# Patient Record
Sex: Female | Born: 1950 | Race: White | Hispanic: No | Marital: Married | Smoking: Former smoker
Health system: Southern US, Community
[De-identification: ages and names within clinical notes are randomized; demographics above are authoritative.]

## PROBLEM LIST (undated history)

## (undated) DIAGNOSIS — E119 Type 2 diabetes mellitus without complications: Secondary | ICD-10-CM

## (undated) DIAGNOSIS — F419 Anxiety disorder, unspecified: Secondary | ICD-10-CM

## (undated) DIAGNOSIS — IMO0002 Reserved for concepts with insufficient information to code with codable children: Secondary | ICD-10-CM

## (undated) DIAGNOSIS — M797 Fibromyalgia: Secondary | ICD-10-CM

## (undated) DIAGNOSIS — F329 Major depressive disorder, single episode, unspecified: Secondary | ICD-10-CM

## (undated) DIAGNOSIS — J449 Chronic obstructive pulmonary disease, unspecified: Secondary | ICD-10-CM

## (undated) DIAGNOSIS — K219 Gastro-esophageal reflux disease without esophagitis: Secondary | ICD-10-CM

## (undated) DIAGNOSIS — I10 Essential (primary) hypertension: Secondary | ICD-10-CM

## (undated) DIAGNOSIS — E559 Vitamin D deficiency, unspecified: Secondary | ICD-10-CM

## (undated) DIAGNOSIS — F32A Depression, unspecified: Secondary | ICD-10-CM

## (undated) HISTORY — PX: ABDOMINAL HYSTERECTOMY: SHX81

## (undated) HISTORY — PX: TONSILLECTOMY: SUR1361

## (undated) HISTORY — DX: Depression, unspecified: F32.A

## (undated) HISTORY — DX: Fibromyalgia: M79.7

## (undated) HISTORY — PX: FOOT SURGERY: SHX648

## (undated) HISTORY — DX: Gastro-esophageal reflux disease without esophagitis: K21.9

## (undated) HISTORY — DX: Anxiety disorder, unspecified: F41.9

## (undated) HISTORY — DX: Vitamin D deficiency, unspecified: E55.9

## (undated) HISTORY — DX: Type 2 diabetes mellitus without complications: E11.9

## (undated) HISTORY — DX: Essential (primary) hypertension: I10

## (undated) HISTORY — PX: CHOLECYSTECTOMY: SHX55

## (undated) HISTORY — PX: SPINE SURGERY: SHX786

## (undated) HISTORY — DX: Chronic obstructive pulmonary disease, unspecified: J44.9

## (undated) HISTORY — DX: Major depressive disorder, single episode, unspecified: F32.9

## (undated) HISTORY — DX: Reserved for concepts with insufficient information to code with codable children: IMO0002

## (undated) HISTORY — PX: HAND SURGERY: SHX662

## (undated) HISTORY — PX: BREAST BIOPSY: SHX20

---

## 2002-06-24 ENCOUNTER — Encounter: Payer: Self-pay | Admitting: General Surgery

## 2002-06-24 ENCOUNTER — Ambulatory Visit (HOSPITAL_COMMUNITY): Admission: RE | Admit: 2002-06-24 | Discharge: 2002-06-24 | Payer: Self-pay | Admitting: General Surgery

## 2003-10-07 ENCOUNTER — Inpatient Hospital Stay (HOSPITAL_COMMUNITY): Admission: EM | Admit: 2003-10-07 | Discharge: 2003-10-10 | Payer: Self-pay | Admitting: Emergency Medicine

## 2003-10-14 ENCOUNTER — Ambulatory Visit (HOSPITAL_COMMUNITY): Admission: RE | Admit: 2003-10-14 | Discharge: 2003-10-14 | Payer: Self-pay | Admitting: Internal Medicine

## 2004-05-11 ENCOUNTER — Emergency Department (HOSPITAL_COMMUNITY): Admission: EM | Admit: 2004-05-11 | Discharge: 2004-05-11 | Payer: Self-pay | Admitting: *Deleted

## 2004-07-22 ENCOUNTER — Emergency Department (HOSPITAL_COMMUNITY): Admission: EM | Admit: 2004-07-22 | Discharge: 2004-07-22 | Payer: Self-pay | Admitting: Emergency Medicine

## 2005-04-10 ENCOUNTER — Ambulatory Visit: Payer: Self-pay | Admitting: Family Medicine

## 2005-04-11 ENCOUNTER — Ambulatory Visit (HOSPITAL_COMMUNITY): Admission: RE | Admit: 2005-04-11 | Discharge: 2005-04-11 | Payer: Self-pay | Admitting: Family Medicine

## 2005-04-12 ENCOUNTER — Ambulatory Visit (HOSPITAL_COMMUNITY): Admission: RE | Admit: 2005-04-12 | Discharge: 2005-04-12 | Payer: Self-pay | Admitting: Family Medicine

## 2005-05-10 ENCOUNTER — Ambulatory Visit: Payer: Self-pay | Admitting: Family Medicine

## 2005-05-18 ENCOUNTER — Ambulatory Visit (HOSPITAL_COMMUNITY): Admission: RE | Admit: 2005-05-18 | Discharge: 2005-05-18 | Payer: Self-pay | Admitting: Family Medicine

## 2005-07-11 ENCOUNTER — Ambulatory Visit: Payer: Self-pay | Admitting: Family Medicine

## 2005-07-27 ENCOUNTER — Ambulatory Visit (HOSPITAL_COMMUNITY): Admission: RE | Admit: 2005-07-27 | Discharge: 2005-07-27 | Payer: Self-pay | Admitting: Neurology

## 2006-06-27 ENCOUNTER — Ambulatory Visit (HOSPITAL_COMMUNITY): Admission: RE | Admit: 2006-06-27 | Discharge: 2006-06-27 | Payer: Self-pay | Admitting: Family Medicine

## 2006-07-31 ENCOUNTER — Ambulatory Visit (HOSPITAL_COMMUNITY): Admission: RE | Admit: 2006-07-31 | Discharge: 2006-07-31 | Payer: Self-pay | Admitting: Family Medicine

## 2006-08-07 ENCOUNTER — Encounter: Admission: RE | Admit: 2006-08-07 | Discharge: 2006-08-07 | Payer: Self-pay | Admitting: Family Medicine

## 2006-08-07 ENCOUNTER — Encounter (INDEPENDENT_AMBULATORY_CARE_PROVIDER_SITE_OTHER): Payer: Self-pay | Admitting: Specialist

## 2007-02-05 ENCOUNTER — Ambulatory Visit (HOSPITAL_COMMUNITY): Admission: RE | Admit: 2007-02-05 | Discharge: 2007-02-05 | Payer: Self-pay | Admitting: Family Medicine

## 2007-05-29 ENCOUNTER — Encounter: Payer: Self-pay | Admitting: Family Medicine

## 2007-08-06 ENCOUNTER — Ambulatory Visit (HOSPITAL_COMMUNITY): Admission: RE | Admit: 2007-08-06 | Discharge: 2007-08-06 | Payer: Self-pay | Admitting: Family Medicine

## 2007-10-08 ENCOUNTER — Ambulatory Visit (HOSPITAL_COMMUNITY): Admission: RE | Admit: 2007-10-08 | Discharge: 2007-10-08 | Payer: Self-pay | Admitting: Family Medicine

## 2008-08-06 ENCOUNTER — Ambulatory Visit (HOSPITAL_COMMUNITY): Admission: RE | Admit: 2008-08-06 | Discharge: 2008-08-06 | Payer: Self-pay | Admitting: Family Medicine

## 2009-12-16 ENCOUNTER — Emergency Department (HOSPITAL_COMMUNITY): Admission: EM | Admit: 2009-12-16 | Discharge: 2009-12-16 | Payer: Self-pay | Admitting: Emergency Medicine

## 2010-01-04 ENCOUNTER — Ambulatory Visit (HOSPITAL_COMMUNITY): Admission: RE | Admit: 2010-01-04 | Discharge: 2010-01-04 | Payer: Self-pay | Admitting: Family Medicine

## 2010-02-16 ENCOUNTER — Encounter
Admission: RE | Admit: 2010-02-16 | Discharge: 2010-05-17 | Payer: Self-pay | Source: Home / Self Care | Attending: Physical Medicine and Rehabilitation | Admitting: Physical Medicine and Rehabilitation

## 2010-02-22 ENCOUNTER — Ambulatory Visit: Payer: Self-pay | Admitting: Physical Medicine and Rehabilitation

## 2010-03-15 ENCOUNTER — Ambulatory Visit: Payer: Self-pay | Admitting: Physical Medicine and Rehabilitation

## 2010-06-18 ENCOUNTER — Encounter: Payer: Self-pay | Admitting: Family Medicine

## 2010-06-27 NOTE — Letter (Signed)
Summary: rpc chart  rpc chart   Imported By: Curtis Sites 03/09/2010 15:49:47  _____________________________________________________________________  External Attachment:    Type:   Image     Comment:   External Document

## 2010-08-12 ENCOUNTER — Emergency Department (HOSPITAL_COMMUNITY)
Admission: EM | Admit: 2010-08-12 | Discharge: 2010-08-12 | Disposition: A | Discharge status: Home / Self Care | Payer: Medicaid Other | Attending: Emergency Medicine | Admitting: Emergency Medicine

## 2010-08-12 DIAGNOSIS — M25519 Pain in unspecified shoulder: Secondary | ICD-10-CM | POA: Insufficient documentation

## 2010-08-12 DIAGNOSIS — Z79899 Other long term (current) drug therapy: Secondary | ICD-10-CM | POA: Insufficient documentation

## 2010-08-12 DIAGNOSIS — K219 Gastro-esophageal reflux disease without esophagitis: Secondary | ICD-10-CM | POA: Insufficient documentation

## 2010-08-12 DIAGNOSIS — I10 Essential (primary) hypertension: Secondary | ICD-10-CM | POA: Insufficient documentation

## 2010-08-12 DIAGNOSIS — E789 Disorder of lipoprotein metabolism, unspecified: Secondary | ICD-10-CM | POA: Insufficient documentation

## 2010-08-12 DIAGNOSIS — IMO0002 Reserved for concepts with insufficient information to code with codable children: Secondary | ICD-10-CM | POA: Insufficient documentation

## 2010-08-12 DIAGNOSIS — M549 Dorsalgia, unspecified: Secondary | ICD-10-CM | POA: Insufficient documentation

## 2010-08-12 DIAGNOSIS — F411 Generalized anxiety disorder: Secondary | ICD-10-CM | POA: Insufficient documentation

## 2010-08-12 DIAGNOSIS — E119 Type 2 diabetes mellitus without complications: Secondary | ICD-10-CM | POA: Insufficient documentation

## 2010-09-15 ENCOUNTER — Encounter
Payer: Medicaid Other | Attending: Physical Medicine and Rehabilitation | Admitting: Physical Medicine and Rehabilitation

## 2010-09-15 DIAGNOSIS — E669 Obesity, unspecified: Secondary | ICD-10-CM | POA: Insufficient documentation

## 2010-09-15 DIAGNOSIS — R209 Unspecified disturbances of skin sensation: Secondary | ICD-10-CM | POA: Insufficient documentation

## 2010-09-15 DIAGNOSIS — G8929 Other chronic pain: Secondary | ICD-10-CM | POA: Insufficient documentation

## 2010-09-15 DIAGNOSIS — M542 Cervicalgia: Secondary | ICD-10-CM

## 2010-09-15 DIAGNOSIS — M47817 Spondylosis without myelopathy or radiculopathy, lumbosacral region: Secondary | ICD-10-CM | POA: Insufficient documentation

## 2010-09-15 DIAGNOSIS — I10 Essential (primary) hypertension: Secondary | ICD-10-CM | POA: Insufficient documentation

## 2010-09-15 DIAGNOSIS — M545 Low back pain, unspecified: Secondary | ICD-10-CM | POA: Insufficient documentation

## 2010-09-15 DIAGNOSIS — R5381 Other malaise: Secondary | ICD-10-CM | POA: Insufficient documentation

## 2010-09-15 DIAGNOSIS — E119 Type 2 diabetes mellitus without complications: Secondary | ICD-10-CM | POA: Insufficient documentation

## 2010-09-15 DIAGNOSIS — G894 Chronic pain syndrome: Secondary | ICD-10-CM

## 2010-09-15 DIAGNOSIS — IMO0001 Reserved for inherently not codable concepts without codable children: Secondary | ICD-10-CM | POA: Insufficient documentation

## 2010-09-15 DIAGNOSIS — E785 Hyperlipidemia, unspecified: Secondary | ICD-10-CM | POA: Insufficient documentation

## 2010-09-15 DIAGNOSIS — M129 Arthropathy, unspecified: Secondary | ICD-10-CM | POA: Insufficient documentation

## 2010-09-16 NOTE — Assessment & Plan Note (Signed)
[name] is a pleasant 60 year old woman who was last seen by me in October 2011.  She had been doing relatively well and had not returned to clinic on her scheduled appointment 1 month after her initial visit.  She had been using gabapentin and found it to be somewhat helpful.  She ran out of her gabapentin in December and noted an overall worsening of her symptoms at that time.  She tried to get back into clinic, however, it has taken a couple of months for her to return to clinic.  Her average pain is about 6-8 on a scale of 10.  Her worst pain complaints are her low back which is worse when she is up walking, goes to about an 8 or 9 when she is walking, sitting it is about 6.  She also has some parascapular pain on the right which she describes as between a 6 and a 10 on a scale of 10.  She has some right arm tingling which bothers her intermittently, more annoying than painful.  Her sleep tends to be poor.  Pain is worse with walking and sitting and bending and standing, improves with heat.  She reports fair relief when she was on medications previously but she has run out now.  FUNCTIONAL STATUS:  She does have some limitations in ambulation.  She has difficulty with stairs.  She is independent with self-care, feeding, dressing, bathing, and toileting, needs assistance with heavier household tasks.  REVIEW OF SYSTEMS:  Positive for numbness, tingling, spasms, depression, anxiety.  Denies suicidal ideation and denies bowel or bladder problems.  PAST MEDICAL HISTORY:  Significant for hypertension, diabetes, hyperlipidemia, obesity.  She tells me that recently her Januvia and metformin were increased by her primary care, Dr. Bevelyn Buckles, over at [facility].  PHYSICAL EXAMINATION TODAY:  VITAL SIGNS:  Her blood pressure is 141/56, pulse 75, respirations 18, 95% saturated on room air. GENERAL:  She is an obese woman who does not appear in any  distress. NEUROLOGIC:  She is oriented x3.  Speech is clear.  Affect is bright. She is alert, cooperative, and pleasant.  Follows commands without difficulty.  Answers my questions appropriately.  Cranial nerves and coordination are intact.  Her reflexes are 2+ at biceps, triceps, brachioradialis, 2+ at patellar and Achilles tendon.  No abnormal tone, clonus, or tremors are noted.  Her motor strength is good in both upper and lower extremities.  She reports no numbness in the upper or lower extremities with pinprick or light touch.  She transitions from sitting to standing without difficulty.  Her gait is non antalgic.  Tandem gait and Romberg test are performed adequately.  She has limitations with rotation of her neck to the right.  She has lumbar range of motion limitations as well.  She has mild tenderness over the right trochanter.  Internal and external rotations at the hips are relatively well preserved.  IMPRESSION:  Long history of chronic low back pain with history of lumbar spondylosis/stenosis.  She has a suggestion of neurogenic claudication with diminished ambulation capacity of not more than 5 minutes.  1. She has had an MRI of cervical, thoracic, and lumbar spine     suggesting some mild degenerative changes in her thoracic spine.     Cervical spine was remarkable for biforaminal narrowing at C4-5,     right greater than left. 2. Lumbar spondylosis per MRI, Oct 08, 2007, T11-12 facet arthropathy,     L3-4 facet arthropathy,  as well as foraminal narrowing, right     greater than left at L3-4. 3. Decondition. 4. Morbid obesity. 5. History of fibromyalgia.  PLAN:  We will restart her gabapentin today 100 mg one p.o. t.i.d. and three at bedtime and will give her some hydrocodone 5/325 one p.o. daily p.r.n. back pain, 10 pills.  She is comfortable with our treatment plan. I will see her back in a month and assess her pain at that time.  I have answered all her  questions.  She is comfortable with our treatment plan. She understands the risks and benefits of these medications as well as the side effects.     Brantley Stage, M.D. Electronically Signed    DMK/MedQ D:  09/15/2010 14:10:33  T:  09/16/2010 03:18:43  Job #:  [id number]

## 2010-10-13 ENCOUNTER — Encounter
Payer: Medicaid Other | Attending: Physical Medicine and Rehabilitation | Admitting: Physical Medicine and Rehabilitation

## 2010-10-13 DIAGNOSIS — G8929 Other chronic pain: Secondary | ICD-10-CM | POA: Insufficient documentation

## 2010-10-13 DIAGNOSIS — M129 Arthropathy, unspecified: Secondary | ICD-10-CM | POA: Insufficient documentation

## 2010-10-13 DIAGNOSIS — G894 Chronic pain syndrome: Secondary | ICD-10-CM

## 2010-10-13 DIAGNOSIS — M47814 Spondylosis without myelopathy or radiculopathy, thoracic region: Secondary | ICD-10-CM | POA: Insufficient documentation

## 2010-10-13 DIAGNOSIS — M542 Cervicalgia: Secondary | ICD-10-CM

## 2010-10-13 DIAGNOSIS — IMO0001 Reserved for inherently not codable concepts without codable children: Secondary | ICD-10-CM | POA: Insufficient documentation

## 2010-10-13 DIAGNOSIS — M79609 Pain in unspecified limb: Secondary | ICD-10-CM

## 2010-10-13 DIAGNOSIS — R5381 Other malaise: Secondary | ICD-10-CM | POA: Insufficient documentation

## 2010-10-13 DIAGNOSIS — M25519 Pain in unspecified shoulder: Secondary | ICD-10-CM | POA: Insufficient documentation

## 2010-10-13 DIAGNOSIS — M545 Low back pain, unspecified: Secondary | ICD-10-CM

## 2010-10-13 NOTE — Op Note (Signed)
   NAME:  Holly Price, SLATES NO.:  [id number]   MEDICAL RECORD NO.:  [id number]                   PATIENT TYPE:  AMB   LOCATION:  DAY                                  FACILITY:  [facility]   PHYSICIAN:  Dalia Heading, M.D.               DATE OF BIRTH:  10-11-1950   DATE OF PROCEDURE:  06/24/2002  DATE OF DISCHARGE:                                 OPERATIVE REPORT   PREOPERATIVE DIAGNOSIS:  Left breast mass, nonpalpable.   POSTOPERATIVE DIAGNOSIS:  Left breast mass, nonpalpable.   PROCEDURE:  Left breast biopsy after needle localization.   SURGEON:  Dalia Heading, M.D.   ANESTHESIA:  MAC.   INDICATIONS:  The patient is a 60 year old white female who is referred for  evaluation and treatment of suspicious microcalcifications seen in the left  breast.  The risks and benefits of the procedure including bleeding and  infection were fully explained to the patient, gaining informed consent.   PROCEDURE NOTE:  The patient was placed in the supine position after having  undergone needle localization in the radiology department.  The left breast  was prepped and draped using the usual sterile technique with Betadine.  Surgical site confirmation was performed.   One percent Xylocaine was used for local anesthesia.  The needle was located  in the inferior portion of the breast at the 6 o'clock position.  The  incision was made, and the dissection was taken down to the tip of the  needle.  Representative left breast tissue was taken from around the tip of  the needle and sent to radiography.  Specimen radiography revealed the  microcalcifications to be within the biopsy specimen removed.  The specimen  was then sent to pathology for further examination.  Any bleeding was  controlled using Bovie electrocautery.  The skin was reapproximated using a  4-0 Vicryl subcuticular suture.  Steri-Strips and a dry sterile dressing  were applied.   All tape and needle counts  were correct at the end of the procedure.  The  patient was transferred to Day Surgery in stable condition.   COMPLICATIONS:  None.    SPECIMEN:  Left breast biopsy.   BLOOD LOSS:  Minimal.                                               Dalia Heading, M.D.    MAJ/MEDQ  D:  06/24/2002  T:  06/24/2002  Job:  [id number]   cc:   Health Department

## 2010-10-13 NOTE — H&P (Signed)
NAME:  Holly Price, Holly Price NO.:  [id number]   MEDICAL RECORD NO.:  [id number]                   PATIENT TYPE:  EMS   LOCATION:  ED                                   FACILITY:  [facility]   PHYSICIAN:  Melvyn Novas, MD        DATE OF BIRTH:  12-Aug-1950   DATE OF ADMISSION:  10/07/2003  DATE OF DISCHARGE:                                HISTORY & PHYSICAL   HISTORY OF PRESENT ILLNESS:  This patient is a 60 year old obese white  female who had four days of protracted diarrhea.  She specifically denies  any melena, hematemesis or hematochezia.  She did have three episodes of  vomiting 48 hours prior to admission.  She specifically denies abdominal  pain.  She denies any dizziness, chest pain, dyspnea.  She was seen in the  ER and found to have hypokalemia with a potassium of 3.2.  Elevated liver  function test, SGOT and SGPT, and some renal failure which may be prerenal  as well as renal, and clinically intravascular volume depleted.  She is on  ACE inhibitors as well as Mobic, and both these medicines will be stopped as  possibly contributing to the multiple morbidities present.   PAST MEDICAL HISTORY:  Significant for hypertension.   PAST SURGICAL HISTORY:  1. Remarkable for left breast lumpectomy.  2. Carpal tunnel syndrome surgery.  3. Cholecystectomy.  4. Vaginal hysterectomy.   SOCIAL HISTORY:  She is currently a nonsmoker.  She smoked for 20 years, one  pack per day.  Ethanol negative.  She is married and has a 76 year-old  child.  She does not work out of the home.   ALLERGIES:  No known allergies.   CURRENT MEDICATIONS:  1. Mobic 15 mg per day.  2. Prozac 40 mg per day.  3. Atenolol 25.  4. Lotensin HCT 20/25 mg p.o. daily.   PHYSICAL EXAMINATION:  VITAL SIGNS:  Blood pressure 114/88, pulse 99 and  regular, respiratory rate 18, temperature 97.  HEENT:  Head normocephalic, atraumatic.  Eyes are PERRLA.  Extraocular  muscles are intact.   Sclerae are clear.  Conjunctivae are pink.  Tongue is  dry, parched mucosa.  No erythema.  No exudate.  LUNGS:  Clear to A&P.  No rales, wheezes or rhonchi are appreciable.  HEART:  Regular rhythm, S1 and S2 of normal intensity.  No heaves, thrills  or rubs.  ABDOMEN:  Soft, nontender.  Bowel sounds are normoactive.  No guarding or  rebound, no mass or organomegaly.  RECTAL:  Heme negative in the ER.  EXTREMITIES:  Trace pedal edema.  NEUROLOGIC:  Cranial nerves II-XII are grossly intact.  The patient moves  all four extremities.  Plantars are downgoing.   IMPRESSION:  1. Intravenous volume depletion secondary to protracted diarrhea.  2. Prerenal/ renal azotemia, on ACE inhibitor.  3. Increased liver function tests without ethanol ingestion.  4. Hypertension.  5. Obesity.  6. Hypokalemia.   PLAN:  1. Admit.  2. Stop Mobic.  3. Stop Lotensin.  4. Give Zofran and Lomotil p.r.n.  5. Normal saline with 200 cc an hour.  6. Serial BMP and liver function tests daily times three days.  7. Potassium chloride 40 mEq, first two IV liters.  8. I will make further recommendations as the data base expands.     ___________________________________________                                         Melvyn Novas, MD   RMD/MEDQ  D:  10/07/2003  T:  10/07/2003  Job:  [id number]

## 2010-10-13 NOTE — Discharge Summary (Signed)
NAME:  Holly Price, Holly Price                         ACCOUNT NO.:  [id number]   MEDICAL RECORD NO.:  [id number]                   PATIENT TYPE:  INP   LOCATION:  A325                                 FACILITY:  [facility]   PHYSICIAN:  Vania Rea, M.D.              DATE OF BIRTH:  [birth year]   DATE OF ADMISSION:  10/07/2003  DATE OF DISCHARGE:  10/10/2003                                 DISCHARGE SUMMARY   PRIMARY CARE PHYSICIAN:  [facility]/Debbie Freida Busman.   DICTATING PHYSICIAN:  Dr. Vania Rea.   DISCHARGE DIAGNOSES:  1. Gastroenteritis, resolved.  2. Hypokalemia, resolved.  3. Acute renal failure, resolved.  4. Hypertension, controlled.  5. Depression, stable.  6. Morbid obesity.  7. Status post clinical advice.  8. Abnormal liver function test, possibly obesity related.  9. Viral studies pending.   DISPOSITION:  Discharge to home.   DISCHARGE CONDITION:  Stable.   DISCHARGE MEDICATIONS:  1. Atenolol 50 mg daily.  2. Prozac 40 mg daily.   HOSPITAL COURSE:  Please refer to History and Physical of Oct 07, 2003.  A  60 year old obese white lady admitted with protracted diarrhea and was found  to have abnormal liver function test and acute renal failure in the  emergency room.   MEDICATIONS PRIOR TO ADMISSION INCLUDE:  1. An ACE inhibitor.  2. An NSAID, Mobik.  These medications were discontinued during admission.   Patient was hydrated with normal saline supplements and potassium to relieve  her hypokalemia.  Patient improved steadily.  She was soon placed on a clear  liquid diet which she tolerated and the diet was advanced.  Today she is  able to eat toast and grits.  She has no nausea, no vomiting, no loose  stools and she is feeling comfortable and ready to go back to her normal  life.   PHYSICAL EXAMINATION:  VITAL SIGNS THIS MORNING:  Temperature 97, pulse 74,  respirations 20, blood pressure 127/41.  HEENT:  Her pupils are round, equal  and reactive to light.  She is not  dehydrated.  CHEST:  Clear to auscultation bilaterally.  CARDIOVASCULAR SYSTEM:  She has a regular rhythm, no murmurs.  ABDOMEN:  Is grossly obese, nontender.  EXTREMITIES:  No edema.   LABS:  Her hemoglobin was 16 on admission.  Today her sodium is 142,  potassium 4.2, chloride 112, CO2 27, glucose 101, BUN 11, creatinine 0.8.  Her total bilirubin is 0.3.  Her alkaline phosphatase is 32, it was always  normal.  Her AST is 48, which is slightly elevated.  It was 72 yesterday.  Her ALT is 66, which is elevated to 74 yesterday.  Her total protein is 5,  her albumin is 2.6, her calcium is 6.5, which is corrected is low so she is  also hypocalcemic.  Her thyroid function studies were normal with a TSH of  0.88, her ferritin is 768.  Hepatitis-C antibody and hep-B surface antibody  are pending.   PLAN:  We will add Os-Cal with D to patient's medication, one tablet three  times daily.  She needs to followup with liver function test and serum  calcium with her primary care physician.   FOLLOW UP:  Followup with her primary care physician at [facility], and also followup with Dr. Karilyn Cota for her liver function  studies.     ___________________________________________                                         Vania Rea, M.D.   LC/MEDQ  D:  10/10/2003  T:  10/10/2003  Job:  [id number]

## 2010-10-13 NOTE — Group Therapy Note (Signed)
NAME:  Holly Price, Holly Price                         ACCOUNT NO.:  [id number]   MEDICAL RECORD NO.:  [id number]                   PATIENT TYPE:  INP   LOCATION:  A325                                 FACILITY:  [facility]   PHYSICIAN:  Vania Rea, M.D.              DATE OF BIRTH:  07-22-1950   DATE OF PROCEDURE:  DATE OF DISCHARGE:                                   PROGRESS NOTE   SUBJECTIVE:  Much improved, no diarrhea, no abdominal pains, no vomiting.   OBJECTIVE:  VITALS:  Temperature 97.4, pulse 88, respirations 20, blood  pressure 119/62.  The pupils are round, equal, and reactive to light.  Mucous membranes are mild to moderately dehydration.  CHEST: Clear to  auscultation bilaterally.  ABDOMEN:  Soft, obese, and nontender.  EXTREMITIES:  No edema.   LABS:  Sodium is 138, potassium 3.7, chloride 109, CO2 25, glucose 103, BUN  22, creatinine 0.9.  Alk phos is 34, AST 72, ALT 74, total protein 5.4,  albumin 2.8, calcium 6.6.   ASSESSMENT:  Gastroenteritis, resolving.   PLAN:  We will advance the diet as tolerated.  If the patient is able to  tolerate a normal diet, we will discharge to be followed up by her primary  care physician.  Deranged liver enzymes are probably the result of a fatty  liver in this very obese lady.  The hypocalcemia is unexplained and probably  related to abnormal labs that we have been getting from the laboratory  recently.  We will repeat a serum calcium.      ___________________________________________                                            Vania Rea, M.D.   LC/MEDQ  D:  10/09/2003  T:  10/09/2003  Job:  [id number]

## 2010-10-13 NOTE — Consult Note (Signed)
NAME:  Holly Price, Holly Price                         ACCOUNT NO.:  [id number]   MEDICAL RECORD NO.:  [id number]                   PATIENT TYPE:  INP   LOCATION:  A325                                 FACILITY:  [facility]   PHYSICIAN:  Lionel December, M.D.                 DATE OF BIRTH:  12/14/1950   DATE OF CONSULTATION:  10/08/2003  DATE OF DISCHARGE:                                   CONSULTATION   GASTROENTEROLOGY CONSULTATION:   REASON FOR CONSULTATION:  Diarrhea.   HISTORY OF PRESENT ILLNESS:  Olita is a 60 year old Caucasian female who was  admitted to Dr. Blair Dolphin service yesterday via emergency room where she  presented with 4-day history of diarrhea associated with nausea, vomiting,  and profound weakness.  She woke up nauseated 4 days ago and soon thereafter  began to have vomiting followed by nonbloody diarrhea.  She had multiple  loose watery stools.  She had a couple of accidents when she could not even  make it to the bathroom.  She had at least 20 stools before she came to the  emergency room.  She was felt to be dehydrated, she also had hypokalemia,  elevated BUN and creatinine.  She was hospitalized.  She was begun on IV  fluids.  Stool study has been requested and samples have not yet been  collected.  She feels better.  She has been tolerating clear liquids.  She  actually feels hungry.  She denies melena or rectal bleeding.  She denies  the use of antibiotics recently or travel outside the country.  She did  experience some diaphoreses and chills but did not have any fever.  She  denies abdominal pain.   REVIEW OF SYSTEMS:  Positive for progressive weakness and back pain which  started about 2 months ago.  She states that even before she developed  diarrhea she got so weak that she had to sit down on a stool to wash dishes,  etc.  She has noted increasing flatulence.  She complains of shoulder and  elbow pain.  She has gained at least 20 pounds in the last 6 months.   MEDICATIONS:  She is presently on Prozac 40 mg once daily, atenolol 50 mg  once daily, Ativan 1 mg at bedtime, Zofran 4 mg IV q.6h. p.r.n., Lomotil  p.r.n. but this was discontinued earlier today.  At home she has been on  Mobic, Flexeril, Lotensin/HCT in addition to Prozac and atenolol.   PAST MEDICAL HISTORY:  Medical problems include hypertension,  osteoarthrosis, and obesity.  She has had two benign lumps removed from her  left breast, the last one was in January 2004.  She had hysterectomy in  1979.  She has had tonsillectomy years ago.  She has had bilateral  decompression for carpal tunnel syndrome.  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  She had fusion with bone graft about 3 years ago.  She has been  vaccinated for hepatitis B.  She recalls she received all three doses.   ALLERGIES:  NK.   FAMILY HISTORY:  Both parents are deceased.  Mother died of CAD at age 41  and she lost one brother of MI at age 90.  She had nine siblings all  together.   SOCIAL HISTORY:  She is married. she has one son.  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  She smoked cigarettes about 20 years but quit a few  years ago.  She does not drink alcohol.   PHYSICAL EXAMINATION:  Pleasant well-developed moderately obese Caucasian  female who is in no acute distress.  Admission weight 234.9 pounds, she is 5  feet 3 inches tall, pulse 85 per minute, blood pressure 126/59, respiratory  rate is 18 and temperature is 97.9.  Conjunctivae are pink.  Sclerae are  nonicteric.  Oropharyngeal mucosa is normal.  She has upper plate and a few  of her teeth in lower jaw.  Neck without masses or thyromegaly.  Cardiac  exam is regular rhythm, normal S1 and S3, no murmur or gallop noted.  Lungs  are clear to auscultation.  Abdomen is obese, bowel sounds are normal,  palpation reveals soft abdomen without tenderness.  Liver edge is 5-6 cm   below RCM, it is nontender, spleen is not palpable.  Rectal examination  deferred.  She does not have peripheral edema or clubbing.   LABORATORIES FROM ADMISSION:  WBC 8.4, H&H 16 and 46.7, platelet count 300K.  Sodium 135, potassium 3.2, chloride 98, CO2 25, glucose 119, BUN 44,  creatinine 3.2, total bilirubin 0.4, AP 40, AST 74, ALT 71, total protein  6.9 with albumin of 3.9 and calcium was 8.2.  BUN and creatinine from this  morning were 48 and 2.4.  Her AST is 60, __________ 59.  Her lipase on  admission was 26.   ASSESSMENT:  1. Veryl is a 59 year old Caucasian female who presents with 4-day history     of nausea, vomiting, and diarrhea.  She is noted to be dehydrated.  She     had elevated BUN and creatinine.  Baseline renal function is unknown as     she has not had recent blood work.  Her creatinine has dropped with IV     hydration.  I suspect these symptoms are due to acute gastroenteritis     either viral or bacterial.  I doubt that she has inflammatory bowel     disease or toxic enteritis.  Stool studies are pending.  2. Mildly elevated transaminases.  I suspect this is due to steatosis.  This     does not appear to be indicative of acute hepatitis.   RECOMMENDATIONS:  We advanced her diet to full liquids.  We will also check  her stool for wbc's and Hemoccult x1.  She will need upper abdominal  ultrasound to review her bile duct, liver, and spleen.   May also consider TSH given progressive weakness which started a couple of  months ago.   If stool studies are negative and diarrhea does not improve with supportive  therapy would consider a colonoscopy.   Patient also needs counseling with the dietitian in order to help her loose  weight.   I would like to thank Dr. Orvan Falconer for the opportunity to participate in the  care of this nice lady.      ___________________________________________                                           Lionel December, M.D.   NR/MEDQ   D:  10/09/2003  T:  10/09/2003  Job:  [id number]

## 2010-10-13 NOTE — H&P (Signed)
   NAME:  Holly Price, Holly Price                         ACCOUNT NO.:  [id number]   MEDICAL RECORD NO.:  [id number]                  PATIENT TYPE:   LOCATION:                                       FACILITY:  [facility]   PHYSICIAN:  Dalia Heading, M.D.               DATE OF BIRTH:  06/23/1927   DATE OF ADMISSION:  06/24/2002  DATE OF DISCHARGE:                                HISTORY & PHYSICAL   CHIEF COMPLAINT:  Left breast mass, nonpalpable.   HISTORY OF PRESENT ILLNESS:  The patient is a 60 year old white female who  was referred for evaluation and treatment of left breast micro  calcifications which appear abnormal on the mammogram.  No lump has been  palpable by the patient.  No nipple discharge or family history of breast  carcinoma is noted.  She was first pregnant at age 68, had two children,  never breast-fed.   PAST MEDICAL HISTORY:  Hypertension.   PAST SURGICAL HISTORY:  1. Neck surgery.  2. Carpal tunnel surgery.  3. Cholecystectomy.  4. Finger surgery.  5. Hysterectomy.  She still does have her ovaries.   CURRENT MEDICATIONS:  1. Vioxx.  2. Lotensin.   ALLERGIES:  No known drug allergies.   REVIEW OF SYSTEMS:  Noncontributory.   PHYSICAL EXAMINATION:  GENERAL:  The patient is a well-developed, well-  nourished white female in no acute distress.  VITAL SIGNS:  She is afebrile and vital signs are stable.  LUNGS:  Clear to auscultation with equal breath sounds bilaterally.  HEART:  Regular rate and rhythm without S3, S4, or murmurs.  BREASTS:  Left breast examination reveals no dominant mass, nipple  discharge, or dimpling.  The axilla is negative for palpable nodes.  Right  breast examination reveals no dominant mass, nipple discharge, or dimpling.  The axilla is negative for palpable nodes.  Mammogram reveals indeterminate  micro calcifications within the left breast.  They seem to be centered  behind the nipple.   IMPRESSION:  Left breast mass, nonpalpable.   PLAN:  The patient is scheduled for a left breast biopsy after needle  localization on June 24, 2002.  The risks and benefits of the procedure  including bleeding and infection were fully explained to the patient, gave  informed consent.                                               Dalia Heading, M.D.    MAJ/MEDQ  D:  06/18/2002  T:  06/18/2002  Job:  [id number]   cc:   Health Department

## 2010-10-14 NOTE — Assessment & Plan Note (Signed)
HISTORY OF PRESENT ILLNESS:  [name] is a pleasant 60 year old woman who is followed in our center [facility] for chronic pain complaints related to her periscapular area and her low back.  She was last seen by me in September 15, 2010.  She has been using gabapentin to sometimes 3 times a day as well as she was given a prescription for some hydrocodone.  She was given 10 tablets last month. She tells me she used 1 pill, however, she did forget to bring her bottle in today.  I have reminded her that she needs to bring her bottles in and no refills we will be given with out of bringing in her pain medication bottle.  However, today she is not requesting any more hydrocodone.  She is finding the gabapentin is working well enough at this time.  Her average pain is about 6-7 on a scale of 10.  She reports fair to good relief with current meds.  Her functional status is overall good.  She ambulates independently. She is independent with self-care, occasionally needs some assistance driving.  No problems with respect to controlling bowel or bladder, does admit to some occasional numbness, tingling in her lower extremities. Denies depression, anxiety, or suicidal ideation.  REVIEW OF SYSTEMS:  Otherwise noncontributory.  PHYSICAL EXAMINATION:  VITAL SIGNS:  Her blood pressure is 160/68, pulse 84, respirations 18, and 96% saturated on room air. GENERAL:  She is well-developed obese woman who does not appear in any distress.  She is oriented x3.  Speech is clear.  Affect is bright.  She is alert, cooperative and pleasant.  Follows commands without difficulty answers my questions appropriately.  Cranial nerves coordination are intact. MUSCULOSKELETAL:  Reflexes are 2+ at the biceps, triceps, brachioradialis, 2+ at the patellar and Achilles tendon.  No abnormal tone, clonus, or tremors are noted.  Motor strength is good in both upper and lower extremities.   No numbness is appreciated in the upper lower extremities.  Transitioning from sitting to standing is done without difficulty.  Gait is not antalgic.  Tandem gait and Romberg test are performed adequately. She has limitations in lumbar motion, some mild tenderness over the trochanters, internal and external rotation at the hips does not aggravate her pain.  Palpation in the thoracolumbar spine does not reveal any significant areas of tenderness.  She does have some periscapular area just medial and inferior to the right scapula and somewhat into the paraspinal musculature in the right lower lumbar spine.  IMPRESSION: 1. Mild thoracolumbar spondylosis with known facet arthropathy at T11-     12, L3-4 and some foraminal narrowing on the right especially at L3-     4. 2. Decondition. 3. Morbid obesity. 4. History of fibromyalgia.  PLAN:  I have encouraged her to walk on a daily basis.  She is finding the gabapentin is somewhat helpful.  It brings her pain down to one or two notches she feels.  She has been tolerating the medication without complications other than dry mouth.  She had been on 300 mg 3 times a day and last month I decreased it to 100 mg 3 times a day and 3 tablets at night.  She typically has been using 1 tablet twice a day and finds that to be significant and finds that to give her reasonable relief. She is comfortable with this management plan currently.  She has voiced that she is not interested in being on a lot of  pain medication.  She has 9 hydrocodone left from last month she used 1 pill in a month.  I will see her back in 2 months.  She will bring bottles in next time if she wishes a refill.  If she has a flare-up I told her she could give me a call, and I will try to get her an little sooner than 2 months if she needs to.  She is comfortable with our plan.     Brantley Stage, M.D. Electronically Signed    DMK/MedQ D:  10/13/2010 12:46:58  T:   10/14/2010 01:36:46  Job #:  [id number]

## 2010-12-18 ENCOUNTER — Encounter
Payer: Medicaid Other | Attending: Physical Medicine and Rehabilitation | Admitting: Physical Medicine and Rehabilitation

## 2010-12-18 DIAGNOSIS — F341 Dysthymic disorder: Secondary | ICD-10-CM | POA: Insufficient documentation

## 2010-12-18 DIAGNOSIS — R279 Unspecified lack of coordination: Secondary | ICD-10-CM | POA: Insufficient documentation

## 2010-12-18 DIAGNOSIS — M25519 Pain in unspecified shoulder: Secondary | ICD-10-CM | POA: Insufficient documentation

## 2010-12-18 DIAGNOSIS — G8928 Other chronic postprocedural pain: Secondary | ICD-10-CM

## 2010-12-18 DIAGNOSIS — IMO0001 Reserved for inherently not codable concepts without codable children: Secondary | ICD-10-CM | POA: Insufficient documentation

## 2010-12-18 DIAGNOSIS — M47814 Spondylosis without myelopathy or radiculopathy, thoracic region: Secondary | ICD-10-CM | POA: Insufficient documentation

## 2010-12-18 DIAGNOSIS — M25529 Pain in unspecified elbow: Secondary | ICD-10-CM

## 2010-12-18 NOTE — Assessment & Plan Note (Signed)
[name] is a very pleasant 60 year old woman who is followed at our [facility] for multiple chronic pain complaints including mild thoracolumbar spondylosis.  She also has been decondition, morbidly obese, history of fibromyalgia.  She comes in today, her chief complaint is some right shoulder pain and tells me that medically, she has been diagnosed with [facility] spotted fever and has been treated with antibiotics.  Her last antibiotic was taken last week.  Her initial tick bite was on her foot.  I do not have any notes from primary care regarding this.  Her chief pain complaint is some right shoulder pain which is worse when she abducts the arm.  She has trouble sleeping on that arm as well.  Pain is aggravated by using this shoulder.  She does have some lower extremity pain which is worse with walking and standing.  She reports good relief with the medications that she uses through [facility].  She uses only 2-3 hydrocodone per month, typically uses gabapentin on a daily basis, however.  FUNCTIONAL STATUS:  She can walk 30 minutes at a time.  She has some difficulty with stairs.  She does drive.  She is independent with self- care, needs assistance with dressing and bathing.  Denies problems controlling bowel or bladder.  Admits to depression and anxiety, but denies suicidal ideation.  Past medical, social, family history as noted above.  Recent diagnosis per the patient of [facility] spotted fever with a history of weight loss, she was 253, went down to 218.  Had a poor appetite as well as diarrhea and no energy.  Social and family history otherwise unchanged.  Medications prescribed through [facility] include: 1. Hydrocodone 1-2 tablets per day, #10 per month, last fill date was     Sep 29, 2010, she has 2 pills left today, December 18, 2010. 2. Gabapentin up to 3 tablets per day as well  as 3 at night.  PHYSICAL EXAMINATION:  VITAL SIGNS:  Blood pressure is 140/64, pulse 74, respirations 18, 94% saturated on room air. NEUROLOGIC:  She is a well-developed, obese woman who does not appear in any distress.  She is oriented x3.  Speech is clear.  Affect is bright. She is alert, cooperative, pleasant.  Follows commands without difficulty, answers my questions appropriately.  Cranial nerves are grossly intact.  Coordination is intact.  Reflexes are 1+ in the upper extremities, diminished in the lower extremities.  No abnormal tone, clonus, or tremors are noted.  Mikey Bussing is negative and no clonus is appreciated.  She reports decreased sensation in the right lower extremity.  She has intact vibratory sense bilaterally.  Motor strength is good without obvious focal deficit in either upper or lower extremities.  She transitions from sitting to standing easily. Gait is non-antalgic.  Tandem gait is performed adequately.  She has a little difficulty with Romberg test.  She has diminished range of motion in her right shoulder with decreased internal rotation and complains of some pain with abduction and has mildly limited range of motion with respect to abduction.  IMPRESSION: 1. Right shoulder pain with decreased range of motion consistent with     rotator cuff versus subacromial bursitis. 2. 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 3. Mild thoracolumbar spondylosis, facet arthropathy T11-T12 and L3-     L4, foraminal narrowing on the right at L3-L4. 4. Decondition. 5. Morbid obesity. 6. History of fibromyalgia.  PLAN:  We discussed treatment options regarding her right shoulder including doing nothing, anti-inflammatory medication, physical therapy, injection.  At this point, she would like to hold off on any injections or medication such as nonsteroidal anti-inflammatory medication.  We will go ahead and get the shoulder x-ray to rule out a hooked  acromion.  She may consider physical therapy down the road.  She does have a mild balance disorder with respect to her Romberg test today.  I cautioned her about walking in the dark and the need to leave night lights on.  We would consider using a cane as well and like to get the notes from her primary care regarding her recent diagnosis of [place] spotted fever.  I will see her back in 6 weeks and reassess at that point to see if she does need any formal balance training.     Brantley Stage, M.D. Electronically Signed    DMK/MedQ D:  12/18/2010 11:22:43  T:  12/18/2010 22:47:09  Job #:  [id number]

## 2011-01-19 ENCOUNTER — Other Ambulatory Visit: Payer: Self-pay | Admitting: Physical Medicine and Rehabilitation

## 2011-01-19 ENCOUNTER — Ambulatory Visit (HOSPITAL_COMMUNITY)
Admission: RE | Admit: 2011-01-19 | Discharge: 2011-01-19 | Disposition: A | Discharge status: Home / Self Care | Payer: Medicaid Other | Source: Ambulatory Visit | Attending: Physical Medicine and Rehabilitation | Admitting: Physical Medicine and Rehabilitation

## 2011-01-19 DIAGNOSIS — R52 Pain, unspecified: Secondary | ICD-10-CM

## 2011-01-19 DIAGNOSIS — M19019 Primary osteoarthritis, unspecified shoulder: Secondary | ICD-10-CM | POA: Insufficient documentation

## 2011-01-19 DIAGNOSIS — M25519 Pain in unspecified shoulder: Secondary | ICD-10-CM | POA: Insufficient documentation

## 2011-01-31 ENCOUNTER — Ambulatory Visit: Payer: Medicaid Other | Admitting: Physical Medicine and Rehabilitation

## 2011-02-02 ENCOUNTER — Encounter
Payer: Medicaid Other | Attending: Physical Medicine and Rehabilitation | Admitting: Physical Medicine and Rehabilitation

## 2011-02-02 DIAGNOSIS — M47817 Spondylosis without myelopathy or radiculopathy, lumbosacral region: Secondary | ICD-10-CM | POA: Insufficient documentation

## 2011-02-02 DIAGNOSIS — IMO0001 Reserved for inherently not codable concepts without codable children: Secondary | ICD-10-CM | POA: Insufficient documentation

## 2011-02-02 DIAGNOSIS — M47814 Spondylosis without myelopathy or radiculopathy, thoracic region: Secondary | ICD-10-CM | POA: Insufficient documentation

## 2011-02-02 DIAGNOSIS — M75100 Unspecified rotator cuff tear or rupture of unspecified shoulder, not specified as traumatic: Secondary | ICD-10-CM

## 2011-02-02 DIAGNOSIS — M25519 Pain in unspecified shoulder: Secondary | ICD-10-CM | POA: Insufficient documentation

## 2011-02-02 DIAGNOSIS — M545 Low back pain, unspecified: Secondary | ICD-10-CM | POA: Insufficient documentation

## 2011-02-02 DIAGNOSIS — M542 Cervicalgia: Secondary | ICD-10-CM | POA: Insufficient documentation

## 2011-02-02 DIAGNOSIS — G894 Chronic pain syndrome: Secondary | ICD-10-CM

## 2011-02-02 DIAGNOSIS — R279 Unspecified lack of coordination: Secondary | ICD-10-CM | POA: Insufficient documentation

## 2011-02-02 DIAGNOSIS — M549 Dorsalgia, unspecified: Secondary | ICD-10-CM | POA: Insufficient documentation

## 2011-02-02 DIAGNOSIS — M19019 Primary osteoarthritis, unspecified shoulder: Secondary | ICD-10-CM | POA: Insufficient documentation

## 2011-02-03 NOTE — Assessment & Plan Note (Signed)
[name] is a pleasant 60 year old woman who is followed here center for pain and rehabilitative medicine for chronic pain complaints including the upper back pain and cervicalgia, right shoulder pain, low back pain.  She recently underwent some right shoulder radiographs.  The results are reviewed with her today.  They were done on January 19, 2011, showing some acromioclavicular arthritis.  I had ordered then to rule out a hooked acromion, and there does not appear to be this anatomic variation.  Her average pain is about 7 or 8 on a scale of 10.  Her biggest problem today is her right shoulder.  Pain is worse with abduction of the arm.  No changes in review of systems, past medical or social or family history.  Exam is limited today, her blood pressure is 121/44, pulse 62, respirations 18, 92% saturated on room air.  She is well-developed obese woman who does not appear in any distress.  She is oriented x3.  Speech is clear.  Affect is bright.  She is alert, cooperative and pleasant. Follows commands without difficulty answers my questions appropriately.  Cranial nerves coordination are intact.  Her right shoulder is examined. She has intact reflexes in the upper extremity.  She has abduction limited to approximately 90 degrees, internal rotation is approximately 40 degrees,  external rotation is approximately 30 degrees, and painful. Difficult to assess strength due to pain at the shoulder joint below the shoulder.  However, she has good strength at the arm at the biceps, triceps, wrist extensors, finger flexors and intrinsics.  IMPRESSION: 1. Right shoulder pain exam is consistent with a subacromial     bursitis/rotator cuff tendonitis.  She also was noted to have some     acromioclavicular joint arthritis per radiographs, however, she is     really not tender over the acromioclavicular joint today.  She has     diminished range of motion at the shoulder in all  planes. 2. She has known balance disorder and I recommend she use a cane. 3. Thoracic and lumbar spondylosis. 4. Overall decondition. 5. Morbid obesity. 6. History of fibromyalgia.  PLAN:  I recommended physical therapy program to address the functional deficits of her right upper extremity due to her insurance we are limited to not more than 3 visits.  I will refill her hydrocodone one to two p.o. daily, #30, p.r.n. back or shoulder pain.  I will see her back next month.  I have answered all her questions.  She is comfortable with the treatment plan at this time.     Holly Price, M.D. Electronically Signed    DMK/MedQ D:  02/02/2011 15:01:26  T:  02/03/2011 00:03:14  Job #:  [id number]

## 2011-02-28 ENCOUNTER — Ambulatory Visit: Payer: Medicaid Other | Admitting: Physical Therapy

## 2011-03-02 ENCOUNTER — Encounter: Payer: Medicaid Other | Admitting: Physical Medicine and Rehabilitation

## 2011-04-02 ENCOUNTER — Encounter: Payer: Medicaid Other | Admitting: Physical Medicine and Rehabilitation

## 2011-04-02 ENCOUNTER — Encounter: Payer: Medicaid Other | Attending: Neurosurgery | Admitting: Neurosurgery

## 2011-04-02 DIAGNOSIS — M47817 Spondylosis without myelopathy or radiculopathy, lumbosacral region: Secondary | ICD-10-CM | POA: Insufficient documentation

## 2011-04-02 DIAGNOSIS — E669 Obesity, unspecified: Secondary | ICD-10-CM | POA: Insufficient documentation

## 2011-04-02 DIAGNOSIS — M25519 Pain in unspecified shoulder: Secondary | ICD-10-CM | POA: Insufficient documentation

## 2011-04-02 DIAGNOSIS — R279 Unspecified lack of coordination: Secondary | ICD-10-CM | POA: Insufficient documentation

## 2011-04-02 DIAGNOSIS — IMO0001 Reserved for inherently not codable concepts without codable children: Secondary | ICD-10-CM | POA: Insufficient documentation

## 2011-04-02 DIAGNOSIS — M47814 Spondylosis without myelopathy or radiculopathy, thoracic region: Secondary | ICD-10-CM | POA: Insufficient documentation

## 2011-04-02 NOTE — Assessment & Plan Note (Signed)
This is patient of Dr. Pamelia Hoit who is seen for right shoulder and interscapular pain.  She reports no change in her pain.  She was on Dr. Leretha Dykes schedule for today but Dr. Pamelia Hoit had top leave urgently and I saw the patient for her.  General activity level is 5. She does not indicate when the pain is worse.  It is worse with walking standing, and activity.  Rest and medication help.  She can walk about 10 minutes at a time.  She drives, she does not climb steps, she is not employed, and she needs help with some household duties and ADLs.  REVIEW OF SYSTEMS:  Notable for difficulties described above as well as some numbness, tingling, trouble walking.  No depression, confusion, suicidal thoughts or aberrant behaviors.  PAST MEDICAL HISTORY/SOCIAL HISTORY/FAMILY HISTORY:  Unchanged.  PHYSICAL EXAMINATION:  Blood pressure 151/64, pulse 67, respirations 16, O2 sats 98 on room air.  Motor strength and sensation intact. Constitutionally she is obese.  She is alert and oriented x3.  She has normal gait.  IMPRESSION: 1. Right shoulder pain, question subacromial bursitis. 2. Balance disorder. 3. History of thoracic and lumbar spondylosis. 4. Obesity. 5. Fibromyalgia.  PLAN:  She will continue her current medications.  She does not need refills today.  She have gabapentin 100 mg t.i.d. and 3 at bedtime and she is using Norco 5/325, 1-2 p.o. daily.  She just had a refill of 30. Her questions were encouraged and answered.  She will follow up with Dr. Pamelia Hoit in 1 month.     Holly Price L. Blima Dessert Electronically Signed    RLW/MedQ D:  04/02/2011 15:18:45  T:  04/02/2011 20:15:32  Job #:  [id number]

## 2011-05-02 ENCOUNTER — Encounter: Payer: Medicaid Other | Admitting: Physical Medicine and Rehabilitation

## 2011-05-07 ENCOUNTER — Encounter: Payer: Medicaid Other | Admitting: Neurosurgery

## 2011-05-17 ENCOUNTER — Encounter
Payer: Medicaid Other | Attending: Physical Medicine and Rehabilitation | Admitting: Physical Medicine and Rehabilitation

## 2011-05-17 DIAGNOSIS — M67919 Unspecified disorder of synovium and tendon, unspecified shoulder: Secondary | ICD-10-CM | POA: Insufficient documentation

## 2011-05-17 DIAGNOSIS — M719 Bursopathy, unspecified: Secondary | ICD-10-CM | POA: Insufficient documentation

## 2011-05-17 DIAGNOSIS — M25519 Pain in unspecified shoulder: Secondary | ICD-10-CM | POA: Insufficient documentation

## 2011-05-17 DIAGNOSIS — R279 Unspecified lack of coordination: Secondary | ICD-10-CM | POA: Insufficient documentation

## 2011-05-17 DIAGNOSIS — IMO0001 Reserved for inherently not codable concepts without codable children: Secondary | ICD-10-CM | POA: Insufficient documentation

## 2011-05-17 DIAGNOSIS — M47814 Spondylosis without myelopathy or radiculopathy, thoracic region: Secondary | ICD-10-CM | POA: Insufficient documentation

## 2011-05-17 DIAGNOSIS — E669 Obesity, unspecified: Secondary | ICD-10-CM | POA: Insufficient documentation

## 2011-05-17 DIAGNOSIS — S43429A Sprain of unspecified rotator cuff capsule, initial encounter: Secondary | ICD-10-CM

## 2011-05-17 DIAGNOSIS — M47817 Spondylosis without myelopathy or radiculopathy, lumbosacral region: Secondary | ICD-10-CM | POA: Insufficient documentation

## 2011-05-18 NOTE — Assessment & Plan Note (Signed)
[name] is a very pleasant 60 year old woman who was seen for initially multiple pain complaints earlier this year.  However, she has been improving.  She has history of some right shoulder pain, which is her chief complaint.  However, she states that today is a 0 on a scale of 10.  It can get worse when she uses it more especially with overhead work, but currently she is doing quite well.  She has taken only 1 hydrocodone since her last visit.  FUNCTIONAL STATUS:  She can walk 20 minutes.  She is able to climb stairs.  She is driving.  She is independent with self-care.  Denies problems with bowel or bladder.  Denies suicidal ideation.  REVIEW OF SYSTEMS:  Negative.  No new medical problems are noted.  PAST MEDICAL, SOCIAL AND FAMILY HISTORY:  Unchanged.  EXAM:  Her blood pressure is 153/67, pulse 71, respirations 16 and 94% saturated on room air.  She is a mildly obese woman who does not appear in any distress.  She is oriented x3.  Speech is clear.  Affect is bright.  She is alert, cooperative and pleasant.  Follows commands without difficulty.  Answers my questions appropriately.  Cranial nerves and coordination are grossly intact.  Her reflexes are diminished in the upper extremities, 1+ at the biceps, triceps and brachioradialis.  No new sensory deficits are noted.  She has good strength at shoulder abductors, biceps, triceps, brachioradialis, finger flexors and intrinsics.  No focal weakness is appreciated.  She has mild limitations in cervical range of motion.  Shoulder exam reveals 160 degrees of AB dissection in both shoulders. This is a significant improvement on the right.  She was able to abduct to about 90 degrees at the last visit.  She has improved quite a bit here.  Her internal and external rotation are also improved from previous visit.  She has approximately 80 degrees of external as well as internal rotation today.  She is not complaining of pain with  these movements currently.  IMPRESSION: 1. Right shoulder pain, history of acromial bursitis overall improved     from last visit.  Her range of motion has improved significantly as     has her pain. 2. History of mild balance disorder. 3. History of thoracic and lumbar spondylosis. 4. Obesity. 5. History of fibromyalgia.  PLAN:  I had written her some physical therapy orders at the last visit. She has multiple reasons why she did not attend.  She has concerns that she only has 4 visits a year per her insurance and did not want to use them up.  She finds that her shoulder range of motion has been slowly improving and the pain in her shoulder has also slowly decreased as she has been gently using it over the last few months.  She is not requesting any pain medications at this time.  She is declined injection.  She finds her shoulder pain to be minimal and is quite functional.  I have suggested to her.  She call us if she needs to come back again. We will have her come back on a p.r.n. basis as she seems to be doing quite well functionally and with respect to pain.  She is comfortable with this plan.    Brantley Stage, M.D. Electronically Signed   DMK/MedQ D:  05/17/2011 13:16:06  T:  05/18/2011 08:41:38  Job #:  [id number]

## 2012-05-09 ENCOUNTER — Encounter
Payer: Medicaid Other | Attending: Physical Medicine and Rehabilitation | Admitting: Physical Medicine and Rehabilitation

## 2012-05-09 ENCOUNTER — Encounter: Payer: Self-pay | Admitting: Physical Medicine and Rehabilitation

## 2012-05-09 VITALS — BP 143/62 | HR 71 | Resp 14 | Ht 63.0 in | Wt 210.0 lb

## 2012-05-09 DIAGNOSIS — M546 Pain in thoracic spine: Secondary | ICD-10-CM | POA: Insufficient documentation

## 2012-05-09 DIAGNOSIS — M79601 Pain in right arm: Secondary | ICD-10-CM

## 2012-05-09 DIAGNOSIS — M549 Dorsalgia, unspecified: Secondary | ICD-10-CM

## 2012-05-09 DIAGNOSIS — M542 Cervicalgia: Secondary | ICD-10-CM

## 2012-05-09 DIAGNOSIS — M79604 Pain in right leg: Secondary | ICD-10-CM

## 2012-05-09 DIAGNOSIS — Z981 Arthrodesis status: Secondary | ICD-10-CM

## 2012-05-09 DIAGNOSIS — G8929 Other chronic pain: Secondary | ICD-10-CM | POA: Insufficient documentation

## 2012-05-09 DIAGNOSIS — M79609 Pain in unspecified limb: Secondary | ICD-10-CM | POA: Insufficient documentation

## 2012-05-09 MED ORDER — DOCUSATE SODIUM 250 MG PO CAPS: 250.0000 mg | ORAL_CAPSULE | Freq: Every day | ORAL | Status: DC

## 2012-05-09 MED ORDER — GABAPENTIN 100 MG PO CAPS
100.0000 mg | ORAL_CAPSULE | Freq: Three times a day (TID) | ORAL | Status: DC
Start: 2012-05-09 — End: 2012-06-25

## 2012-05-09 MED ORDER — TRAMADOL-ACETAMINOPHEN 37.5-325 MG PO TABS: 1.0000 | ORAL_TABLET | Freq: Two times a day (BID) | ORAL | Status: DC | PRN

## 2012-05-09 MED ORDER — MELOXICAM 7.5 MG PO TABS: 7.5000 mg | ORAL_TABLET | Freq: Every day | ORAL | Status: DC

## 2012-05-09 NOTE — Progress Notes (Signed)
Subjective:    Patient ID: Holly Price, female    DOB: 26-Aug-1950, 61 y.o.   MRN: [id number]  HPI  Holly Price is a pleasant 61 year old woman who is followed here  at [facility]  for chronic pain complaints  including the upper back pain and cervicalgia, right shoulder pain, low  back pain. she was last seen in september of 2012.  She presents today with chief complaint of mid back pain which radiates somewhat to the right. She reports this started out gradually but she notices it might have gotten much worse in August or September. She describes the pain as mostly a constant dull ache and occasionally stabbing. She has tried icing it but this does not help much. She states that it may have started after she was picking up some heavy items. She denies problems controlling bowel or bladder she denies weakness or numbness. She does report occasional right foot tingling but states that this is gone on may be about one year. She also had some right leg aching S. she describes this as a 7 on a scale of 10. This also has persisted for at least a year.  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 She reports no numbness specifically however does report occasional tingling but no weakness. She does have some intermittent pain in her right neck. No new injuries to this area.  She can walk about 15 minutes she's able to climb stairs and drive she does do household tasks including washing his dishes as well as shopping.  In the past she has been on gabapentin 100 mg twice a day which did help but she is no longer on this.  Past imaging studies:  10/08/07 cervical MRI showed broad based disc bulge at C4-5 mild disc bulge at C3-4 fusion at C5-6  01/04/2010 thoracic MRI showed stable mild degenerative changes with Schmorl's nodes at multiple levels no significant disc protrusions spinal or foraminal stenosis thoracic  spinal cord no significant findings.   10/08/2007 lumbar MRI  1. Asymmetric right-sided disc bulge at L4-5 with moderate right  and mild left foraminal narrowing. Mild bilateral lateral recess  narrowing is also present. This demonstrates slight progression  from prior exam.  2. Mild facet hypertrophy at L3-4.  3. Mild right neural foraminal narrowing at T11-12 due to facet  hypertrophy.       Pain Inventory Average Pain 8 Pain Right Now 7 My pain is constant, sharp, dull, stabbing and tingling  In the last 24 hours, has pain interfered with the following? General activity 8 Relation with others 8 Enjoyment of life 8 What TIME of day is your pain at its worst? all the time Sleep (in general) Poor  Pain is worse with: walking, sitting and standing Pain improves with: heat/ice Relief from Meds: not taking any medication for pain  Mobility walk without assistance how many minutes can you walk? 15 ability to climb steps?  yes do you drive?  yes Do you have any goals in this area?  yes  Function disabled: date disabled 2009  Neuro/Psych weakness numbness tingling trouble walking spasms  Prior Studies Any changes since last visit?  no  Physicians involved in your care Any changes since last visit?  no   History reviewed. No pertinent family history. History   Social History  . Marital Status: Married    Spouse Name:  N/A    Number of Children: N/A  . Years of Education: N/A   Social History Main Topics  . Smoking status: Current Every Day Smoker -- 0.5 packs/day    Types: Cigarettes  . Smokeless tobacco: None     Comment: 8-10 cigarettes a day  . Alcohol Use: None  . Drug Use: None  . Sexually Active: None   Other Topics Concern  . None   Social History Narrative  . None   Past Surgical History  Procedure Date  . Cholecystectomy   . Abdominal hysterectomy   . Spine surgery    Past Medical History  Diagnosis Date  . Diabetes mellitus  without complication    BP 143/62  Pulse 71  Resp 14  Ht 5\' 3"  (1.6 m)  Wt 210 lb (95.255 kg)  BMI 37.20 kg/m2  SpO2 94%    Review of Systems  Musculoskeletal: Positive for back pain and gait problem.  Neurological: Positive for weakness and numbness.       Tingling and spasms  All other systems reviewed and are negative.       Objective:   Physical Exam She is well-developed obese  woman who does not appear in any distress. She is oriented x3. Speech  is clear. Affect is bright. She is alert, cooperative and pleasant.  Follows commands without difficulty answers my questions appropriately  Cranial nerves and coordination are intact  Reflexes are 2+ in the upper extremities at biceps triceps brachioradialis  Reflexes are 2+ in the lower extremities at patellar and Achilles tendon  No abnormal tone clonus or tremors are noted  Sensation to light touch and pinprick are intact in upper as well as lower extremities  Manual muscle testing reveals good strength at biceps triceps break radiologist and her flexors and intrinsics  Manual muscle testing reveals 5/5 strength at hip flexors knee extensors knee flexors dorsiflexors plantar flexors EHL  Transitions easily from sitting to standing  Normal gait  Tandem gait performed adequately  Some difficulty with Romberg test  Heel toe walking adequate  Complaints of pain in lower thoracic upper lumbar region with forward flexion was limited motion  Mild limitations in extension without as much discomfort  Some tenderness with palpation in lower thoracic spine  Some limitations range of motion neck especially with rotation to the right, flexion and extension             Assessment & Plan:  1. Lower thoracic pain worse with forward flexion and some tenderness with palpation. Patient gives a history of lifting heavy objects. She has chronic pain in this area but had an exacerbation couple months ago. Will rule  out compression fracture with thoracic radiographs.  2. Right arm pain with history of previous C5-6 fusion, known disc bulge at C4-5 above fusion. We'll obtain flexion extension radiographs of cervical spine  We'll start patient back on gabapentin 100 mg 2-3 times a day, Ultracet one to 2 times a day, MOBIC 7.5 mg 7-10 day course as well a some Colace to prevent constipation  We discussed considering physical therapy she would like to consider this after the holidays. Will also check x-rays prior to sending her to therapy. I'll see her back in 2 weeks  It appears she has not been keeping up with preventative care with her primary care Dr. She has been recommended to have a colonoscopy however she has not done this yet. She may also be due for mammograms.  She states she  will follow up with primary care.  She is comfortable with our plan   [facility] DMHDDSAS Controlled Substance accessed today, one prescripton for tyenol with codiene in Feb 2013.

## 2012-05-09 NOTE — Patient Instructions (Addendum)
We have discussed your neck pain, your mid back pain your right upper extremity and right lower extremity pain.  I have ordered x-rays of your mid back as well as your neck  I am starting you on gabapentin, Mobic, Ultracet and Colace.  I would like you to started in physical therapy after I review your x-rays.  I will see you back in 2 weeks.  Please make sure you followup with your primary care Dr. Bonita Quin may be due for a colonoscopy as well as a mammogram.

## 2012-05-12 ENCOUNTER — Ambulatory Visit (HOSPITAL_COMMUNITY)
Admission: RE | Admit: 2012-05-12 | Discharge: 2012-05-12 | Disposition: A | Discharge status: Home / Self Care | Payer: Medicaid Other | Source: Ambulatory Visit | Attending: Physical Medicine and Rehabilitation | Admitting: Physical Medicine and Rehabilitation

## 2012-05-12 DIAGNOSIS — M546 Pain in thoracic spine: Secondary | ICD-10-CM | POA: Insufficient documentation

## 2012-05-12 DIAGNOSIS — Z981 Arthrodesis status: Secondary | ICD-10-CM

## 2012-05-12 DIAGNOSIS — M549 Dorsalgia, unspecified: Secondary | ICD-10-CM

## 2012-05-12 DIAGNOSIS — M542 Cervicalgia: Secondary | ICD-10-CM | POA: Insufficient documentation

## 2012-05-12 DIAGNOSIS — M79601 Pain in right arm: Secondary | ICD-10-CM

## 2012-05-23 ENCOUNTER — Encounter: Payer: Self-pay | Admitting: Physical Medicine and Rehabilitation

## 2012-05-23 ENCOUNTER — Encounter (HOSPITAL_BASED_OUTPATIENT_CLINIC_OR_DEPARTMENT_OTHER): Payer: Medicaid Other | Admitting: Physical Medicine and Rehabilitation

## 2012-05-23 VITALS — BP 127/49 | HR 59 | Resp 14 | Ht 63.0 in | Wt 212.0 lb

## 2012-05-23 DIAGNOSIS — M25519 Pain in unspecified shoulder: Secondary | ICD-10-CM

## 2012-05-23 DIAGNOSIS — M25511 Pain in right shoulder: Secondary | ICD-10-CM

## 2012-05-23 DIAGNOSIS — M542 Cervicalgia: Secondary | ICD-10-CM

## 2012-05-23 MED ORDER — TRAMADOL-ACETAMINOPHEN 37.5-325 MG PO TABS: 1.0000 | ORAL_TABLET | Freq: Two times a day (BID) | ORAL | Status: DC | PRN

## 2012-05-23 MED ORDER — MELOXICAM 7.5 MG PO TABS: 7.5000 mg | ORAL_TABLET | Freq: Every day | ORAL | Status: DC

## 2012-05-23 MED ORDER — CYCLOBENZAPRINE HCL 10 MG PO TABS: 10.0000 mg | ORAL_TABLET | Freq: Three times a day (TID) | ORAL | Status: DC | PRN

## 2012-05-23 MED ORDER — HYDROCODONE-ACETAMINOPHEN 7.5-325 MG PO TABS: ORAL_TABLET | ORAL | Status: DC

## 2012-05-23 NOTE — Patient Instructions (Addendum)
Today I reviewed the x-rays of your neck and your mid back  I understand your neck pain and your pain around her shoulder blade have worsened over the last 2-3 months and are interfering significantly with your activities at home.  You have a cervical fusion from 2001.  I would like you to see a physical therapist to have them help you with education on ergonomics and posture as well as some pain management techniques including trialing a TENS unit.  I would like you to take your gabapentin as prescribed  You also have Mobic and flexeril as well as tramadol.  I have ordered an MRI of her cervical spine and I will review this with you at our next visit.  For your MRI I have ordered 2 doses of hydrocodone and please take your flexeril prior to your MRI exam as well.  We have discussed consideration of a soft collar but I understand you would like to defer this for now  I'll see you back in 2 weeks

## 2012-05-23 NOTE — Progress Notes (Signed)
Subjective:    Patient ID: Holly Price, female    DOB: 1950/09/04, 61 y.o.   MRN: [id number]  HPI  Holly Price is a pleasant 61 year old woman who is followed here  at [facility] for chronic pain complaints  including the upper back pain and cervicalgia, right shoulder pain, low  back pain. she was last seen in september of 2012.  She presents today with chief complaint of mid back pain which radiates somewhat to the right. She reports this started out gradually but she notices it might have gotten much worse in August or September. She describes the pain as mostly a constant dull ache and occasionally stabbing. She has tried icing it but this does not help much. She states that it may have started after she was picking up some heavy items. She denies problems controlling bowel or bladder she denies weakness or numbness. She does report occasional right foot tingling but states that this is gone on may be about one year. She also had some right leg aching S. she describes this as a 7 on a scale of 10. This also has persisted for at least a year.  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 She reports no numbness specifically however does report occasional tingling but no weakness. She does have some intermittent pain in her right neck. No new injuries to this area.  She can walk about 15 minutes she's able to climb stairs and drive she does do household tasks including washing his dishes as well as shopping.  She was recently placed back on on gabapentin 100 mg qid but reports that she does not take it.   Past imaging studies:  10/08/07 cervical MRI showed broad based disc bulge at C4-5 mild disc bulge at C3-4 fusion at C5-6  01/04/2010 thoracic MRI showed stable mild degenerative changes with Schmorl's nodes at multiple levels no significant disc protrusions spinal or foraminal stenosis thoracic spinal cord  no significant findings.  10/08/2007 lumbar MRI  1. Asymmetric right-sided disc bulge at L4-5 with moderate right  and mild left foraminal narrowing. Mild bilateral lateral recess  narrowing is also present. This demonstrates slight progression  from prior exam.  2. Mild facet hypertrophy at L3-4.  3. Mild right neural foraminal narrowing at T11-12 due to facet  hypertrophy.       Pain Inventory Average Pain 7 Pain Right Now 7 My pain is sharp, burning, dull, stabbing and tingling  In the last 24 hours, has pain interfered with the following? General activity 7 Relation with others 7 Enjoyment of life 7 What TIME of day is your pain at its worst? daytime Sleep (in general) Fair  Pain is worse with: some activites Pain improves with: pacing activities Relief from Meds: 5  Mobility walk without assistance ability to climb steps?  yes do you drive?  yes Do you have any goals in this area?  yes  Function not employed: date last employed  I need assistance with the following:  household duties and shopping  Neuro/Psych numbness tingling spasms  Prior Studies Any changes since last visit?  no  Physicians involved in your care Any changes since last visit?  no   History reviewed. No pertinent family history. History   Social History  . Marital Status: Married    Spouse Name: N/A    Number of Children: N/A  .  Years of Education: N/A   Social History Main Topics  . Smoking status: Current Every Day Smoker -- 0.5 packs/day    Types: Cigarettes  . Smokeless tobacco: None     Comment: 8-10 cigarettes a day  . Alcohol Use: None  . Drug Use: None  . Sexually Active: None   Other Topics Concern  . None   Social History Narrative  . None   Past Surgical History  Procedure Date  . Cholecystectomy   . Abdominal hysterectomy   . Spine surgery    Past Medical History  Diagnosis Date  . Diabetes mellitus without complication    BP 127/49  Pulse 59   Resp 14  Ht 5\' 3"  (1.6 m)  Wt 212 lb (96.163 kg)  BMI 37.55 kg/m2  SpO2 95%     Review of Systems  Musculoskeletal: Positive for back pain.  Neurological: Positive for numbness.  All other systems reviewed and are negative.       Objective:   Physical Exam She is well-developed obese  woman who does not appear in any distress. She is oriented x3. Speech  is clear. Affect is bright. She is alert, cooperative and pleasant.  Follows commands without difficulty answers my questions appropriately  Cranial nerves and coordination are intact  Reflexes are 2+ in the upper extremities at biceps triceps brachioradialis  Reflexes are 2+ in the lower extremities at patellar and Achilles tendon   Hoffman's on Right.  No abnormal tone clonus or tremors are noted  Sensation to light touch and pinprick are intact in upper as well as lower extremities  Manual muscle testing reveals good strength at biceps triceps brachioradialis finger flexors and intrinsics  Manual muscle testing reveals 5/5 strength at hip flexors knee extensors knee flexors dorsiflexors plantar flexors EHL  Transitions easily from sitting to standing  Normal gait  Tandem gait performed adequately  Some difficulty with Romberg test  Heel toe walking adequate  Complaints of pain in lower thoracic upper lumbar region with forward flexion was limited motion  Mild limitations in extension without as much discomfort  Some tenderness with palpation in lower thoracic spine  Some limitations range of motion neck especially with rotation to the right, flexion and extension         Assessment & Plan:  1.Right arm pain with history of previous C5-6 fusion, known disc bulge at C4-5 above fusion.  flexion extension radiographs of cervical spine showed no instability.  Will order cervical mri without contrast.  Continue meloxicam.  Continue tramadol  Encourage her to take gabapentin, add flexeril.    Prescription for soft  collar.  Patient does not think she can participate in physical therapy at this time due to discomfort.  Pt reports she may have difficultly laying still for MRI due to pain, will give hydrocodone 1-2 tablets prior to study.    F/u in 2 weeks

## 2012-06-13 ENCOUNTER — Ambulatory Visit: Payer: Medicaid Other | Admitting: Physical Medicine and Rehabilitation

## 2012-06-25 ENCOUNTER — Encounter
Payer: Medicaid Other | Attending: Physical Medicine and Rehabilitation | Admitting: Physical Medicine and Rehabilitation

## 2012-06-25 ENCOUNTER — Encounter: Payer: Self-pay | Admitting: Physical Medicine and Rehabilitation

## 2012-06-25 VITALS — BP 183/93 | HR 93 | Resp 14 | Ht 63.0 in | Wt 210.0 lb

## 2012-06-25 DIAGNOSIS — R200 Anesthesia of skin: Secondary | ICD-10-CM | POA: Insufficient documentation

## 2012-06-25 DIAGNOSIS — M79641 Pain in right hand: Secondary | ICD-10-CM

## 2012-06-25 DIAGNOSIS — M549 Dorsalgia, unspecified: Secondary | ICD-10-CM | POA: Insufficient documentation

## 2012-06-25 DIAGNOSIS — R209 Unspecified disturbances of skin sensation: Secondary | ICD-10-CM

## 2012-06-25 DIAGNOSIS — M79609 Pain in unspecified limb: Secondary | ICD-10-CM | POA: Insufficient documentation

## 2012-06-25 DIAGNOSIS — Z8639 Personal history of other endocrine, nutritional and metabolic disease: Secondary | ICD-10-CM

## 2012-06-25 DIAGNOSIS — M47814 Spondylosis without myelopathy or radiculopathy, thoracic region: Secondary | ICD-10-CM | POA: Insufficient documentation

## 2012-06-25 DIAGNOSIS — R202 Paresthesia of skin: Secondary | ICD-10-CM | POA: Insufficient documentation

## 2012-06-25 DIAGNOSIS — Z981 Arthrodesis status: Secondary | ICD-10-CM

## 2012-06-25 DIAGNOSIS — M5414 Radiculopathy, thoracic region: Secondary | ICD-10-CM

## 2012-06-25 DIAGNOSIS — IMO0002 Reserved for concepts with insufficient information to code with codable children: Secondary | ICD-10-CM | POA: Insufficient documentation

## 2012-06-25 MED ORDER — TRAMADOL-ACETAMINOPHEN 37.5-325 MG PO TABS: 1.0000 | ORAL_TABLET | Freq: Two times a day (BID) | ORAL | Status: DC | PRN

## 2012-06-25 MED ORDER — GABAPENTIN 100 MG PO CAPS: 100.0000 mg | ORAL_CAPSULE | Freq: Three times a day (TID) | ORAL | Status: DC

## 2012-06-25 MED ORDER — LIDOCAINE 5 % EX PTCH: 1.0000 | MEDICATED_PATCH | CUTANEOUS | Status: DC

## 2012-06-25 NOTE — Progress Notes (Signed)
Subjective:    Patient ID: Holly Price, female    DOB: 05/15/1951, 62 y.o.   MRN: [id number]  HPI  62 year old woman with CC of right sided thoracic pain which she describes as a 7 on a scale of 10 and fairly constant but does worsen with activities. Pain is described as dull tingling and aching. She reports a non-patch in the right side of her mid back region.  Admits to constipation denies problems controlling bowel or bladder does report some problems with balance at night.  Aggravated typically with household tasks such as laundry/mopping.  In past to send problems with right upper extremity/shoulder however this is subsided 3/10 pain currently  She reports this mid back pain to be present for a couple of years but has worsened since the fall of 2013.  Also has h/o bilateral carpal tunnel surgery years ago and is currently having some bilateral hand numbness and tingling at night intermittently.    Pain Inventory Average Pain 7 Pain Right Now 7 My pain is dull, tingling and aching  In the last 24 hours, has pain interfered with the following? General activity 5 Relation with others 5 Enjoyment of life 5 What TIME of day is your pain at its worst? daytime Sleep (in general) Poor  Pain is worse with: some activites Pain improves with: pacing activities Relief from Meds: 4  Mobility Do you have any goals in this area?  no  Function disabled: date disabled  I need assistance with the following:  meal prep, household duties and shopping  Neuro/Psych numbness tingling spasms  Prior Studies Any changes since last visit?  no  Physicians involved in your care Any changes since last visit?  no   History reviewed. No pertinent family history. History   Social History  . Marital Status: Married    Spouse Name: N/A    Number of Children: N/A  . Years of Education: N/A   Social History Main Topics  . Smoking status: Current Every Day Smoker -- 0.5 packs/day     Types: Cigarettes  . Smokeless tobacco: None     Comment: 8-10 cigarettes a day  . Alcohol Use: None  . Drug Use: None  . Sexually Active: None   Other Topics Concern  . None   Social History Narrative  . None   Past Surgical History  Procedure Date  . Cholecystectomy   . Abdominal hysterectomy   . Spine surgery    Past Medical History  Diagnosis Date  . Diabetes mellitus without complication    BP 183/93  Pulse 93  Resp 14  Ht 5\' 3"  (1.6 m)  Wt 210 lb (95.255 kg)  BMI 37.20 kg/m2  SpO2 99%     Review of Systems  Gastrointestinal: Positive for constipation.  Neurological: Positive for numbness.  All other systems reviewed and are negative.       Objective:   Physical Exam She is well-developed obese  woman who does not appear in any distress. She is oriented x3. Speech  is clear. Affect is bright. She is alert, cooperative and pleasant.  Follows commands without difficulty answers my questions appropriately  Cranial nerves and coordination are intact  Reflexes are 2+ in the upper extremities at biceps triceps brachioradialis  Reflexes are 2+ in the lower extremities at patellar and Achilles tendon  Hoffman's negative bilat. No abnormal tone clonus or tremors are noted  Sensation to light touch and pinprick are intact in upper as well as lower  extremities   Reports numbness with pinprick adjacent to T7 or 8 CT 8 or 9 on right.  Vibratory and  position sense are intact in bilateral lower extremities.   Manual muscle testing reveals good strength at biceps triceps brachioradialis finger flexors and intrinsics  Manual muscle testing reveals 5/5 strength at hip flexors knee extensors knee flexors dorsiflexors plantar flexors EHL  Transitions easily from sitting to standing  Normal gait  Tandem gait performed adequately  Some difficulty with Romberg test  Heel toe walking adequate  Complaints of pain in lower thoracic upper lumbar region with forward  flexion was limited motion  Mild limitations in extension without as much discomfort  Some tenderness with palpation in lower thoracic spine  Some limitations range of motion neck especially with rotation to the right, flexion and extension    brief evaluation of hands reveals previous carpal tunnel surgery scars. No obvious atrophy of thenar eminence. No weakness appreciated. No numbness at this time..        Assessment & Plan:  1. Worsening of chronic right mid thoracic pain over 4 months. There is an area of numbness adjacent to T8. Thoracic radiographs reveal some spondylosis, there does appear to be larger osteophyte in the midthoracic area, question whether these are impinging right mid thoracic roots ?T7 or T8.  Pain is impacting household tasks.  2. Overall improvement right cervical/shoulder pain. 3/10 pain scale  3. History of previous carpal tunnel surgery formally with current nocturnal symptoms.  Plan: Refill gabapentin and Ultracet, will trial with Lidoderm  Thoracic MRI to evaluate further for etiology of pain.  Wrist splints for evening.  Follow up one month.    I've also told the patient she needs to follow up with her primary care for general medical care as well as preventative. (She has not had a colonoscopy and has sx of constipation)

## 2012-06-25 NOTE — Patient Instructions (Addendum)
I understand your chief complaint today is your right mid back pain.    I have reviewed your previous x-rays with you today of your thoracic spine as well as your cervical spine.  I understand that it is been worse in the last 4 months for you.  I am refill in your gabapentin tramadol and trying you on Lidoderm patches.  I have ordered an MRI of your thoracic spine to evaluate your pain further.  I also understand your having some problems with numbness and tingling particularly at night with both of your hands and I have ordered some hand splints for this.  I will consider electrodiagnostic studies in the upcoming months if they are not improving for you  I would like you to followup with your primary care Dr. regarding your constipation as well as for your general medical care.  I will see you back in one month

## 2012-07-15 ENCOUNTER — Ambulatory Visit (HOSPITAL_COMMUNITY)
Admission: RE | Admit: 2012-07-15 | Discharge: 2012-07-15 | Disposition: A | Discharge status: Home / Self Care | Payer: Medicaid Other | Source: Ambulatory Visit | Attending: Physical Medicine and Rehabilitation | Admitting: Physical Medicine and Rehabilitation

## 2012-07-15 DIAGNOSIS — M47814 Spondylosis without myelopathy or radiculopathy, thoracic region: Secondary | ICD-10-CM

## 2012-07-15 DIAGNOSIS — M546 Pain in thoracic spine: Secondary | ICD-10-CM | POA: Insufficient documentation

## 2012-07-15 DIAGNOSIS — M129 Arthropathy, unspecified: Secondary | ICD-10-CM | POA: Insufficient documentation

## 2012-07-15 DIAGNOSIS — M5414 Radiculopathy, thoracic region: Secondary | ICD-10-CM

## 2012-07-23 ENCOUNTER — Encounter: Payer: Self-pay | Admitting: Physical Medicine and Rehabilitation

## 2012-07-23 ENCOUNTER — Encounter
Payer: Medicaid Other | Attending: Physical Medicine and Rehabilitation | Admitting: Physical Medicine and Rehabilitation

## 2012-07-23 VITALS — BP 155/39 | HR 57 | Resp 14 | Ht 63.0 in | Wt 211.2 lb

## 2012-07-23 DIAGNOSIS — M549 Dorsalgia, unspecified: Secondary | ICD-10-CM

## 2012-07-23 DIAGNOSIS — R200 Anesthesia of skin: Secondary | ICD-10-CM

## 2012-07-23 DIAGNOSIS — R209 Unspecified disturbances of skin sensation: Secondary | ICD-10-CM

## 2012-07-23 DIAGNOSIS — IMO0002 Reserved for concepts with insufficient information to code with codable children: Secondary | ICD-10-CM | POA: Insufficient documentation

## 2012-07-23 DIAGNOSIS — M47814 Spondylosis without myelopathy or radiculopathy, thoracic region: Secondary | ICD-10-CM | POA: Insufficient documentation

## 2012-07-23 DIAGNOSIS — M79609 Pain in unspecified limb: Secondary | ICD-10-CM | POA: Insufficient documentation

## 2012-07-23 MED ORDER — TRAMADOL-ACETAMINOPHEN 37.5-325 MG PO TABS: 1.0000 | ORAL_TABLET | Freq: Two times a day (BID) | ORAL | Status: DC | PRN

## 2012-07-23 MED ORDER — GABAPENTIN 100 MG PO CAPS: 100.0000 mg | ORAL_CAPSULE | Freq: Three times a day (TID) | ORAL | Status: DC

## 2012-07-23 NOTE — Patient Instructions (Addendum)
I have reviewed your thoracic MRI today with you.  I have refilled your tramadol, you do not need a refill on your gabapentin at this time  Followup visit in 3 months

## 2012-07-23 NOTE — Progress Notes (Signed)
Subjective:    Patient ID: Holly Price, female    DOB: July 12, 1950, 61 y.o.   MRN: [id number]  HPI  62 year old woman with CC of right sided thoracic pain which she describes as a 7 on a scale of 10 and fairly constant but does worsen with activities. Pain is described as dull tingling and aching. She reports a non-patch in the right side of her mid back region.  Thoracic MRI recently showed  "Mild facet arthropathy T10-11 and T11-12 is  noted. "  This pain has been stable there are no new changes.  She reports the intermittent numbness in her hands has improved, she tells me the night splints are helping  Pain Inventory Average Pain 7 Pain Right Now 7 My pain is dull, tingling and aching  In the last 24 hours, has pain interfered with the following? General activity 7 Relation with others 7 Enjoyment of life 7 What TIME of day is your pain at its worst? night Sleep (in general) Poor  Pain is worse with: unsure Pain improves with: medication Relief from Meds: 4  Mobility walk without assistance  Function not employed: date last employed home  Neuro/Psych numbness tingling  Prior Studies Any changes since last visit?  no  Physicians involved in your care Any changes since last visit?  no   History reviewed. No pertinent family history. History   Social History  . Marital Status: Married    Spouse Name: N/A    Number of Children: N/A  . Years of Education: N/A   Social History Main Topics  . Smoking status: Former Smoker -- 0.50 packs/day    Types: Cigarettes    Quit date: 06/22/2012  . Smokeless tobacco: None     Comment: 8-10 cigarettes a day  . Alcohol Use: None  . Drug Use: None  . Sexually Active: None   Other Topics Concern  . None   Social History Narrative  . None   Past Surgical History  Procedure Laterality Date  . Cholecystectomy    . Abdominal hysterectomy    . Spine surgery     Past Medical History  Diagnosis Date  .  Diabetes mellitus without complication    BP 155/39  Pulse 57  Resp 14  Ht 5\' 3"  (1.6 m)  Wt 211 lb 3.2 oz (95.8 kg)  BMI 37.42 kg/m2  SpO2 96%   Review of Systems  Musculoskeletal: Positive for back pain.  Neurological: Positive for numbness.       Tingling  All other systems reviewed and are negative.       Objective:   Physical Exam  She is well-developed obese   woman who does not appear in any distress. She is oriented x3. Speech   is clear. Affect is bright. She is alert, cooperative and pleasant.   Follows commands without difficulty answers my questions appropriately   Cranial nerves and coordination are intact   Reflexes are 2+ in the upper extremities at biceps triceps brachioradialis   Reflexes are 2+ in the lower extremities at patellar and Achilles tendon   Hoffman's negative bilat. No abnormal tone clonus or tremors are noted   Sensation to light touch and pinprick are intact in upper as well as lower extremities  Lower extremity strength is good  Transitions easily from sit to stand  Gait is nonantalgic  Tenderness noted along right scapular border     Assessment & Plan:  1. Chronic right periscapular pain; thoracic MRI "Stable appearance  of the thoracic spine without central canal or foraminal stenosis. Mild facet arthropathy T10-11 and T11-12 is noted. "  2. Overall improvement right cervical/shoulder pain. 3/10 pain scale  3. History of previous carpal tunnel surgery formally with current nocturnal symptoms. Using night splint with improvement   Gabapentin is refill today she does not need a refill on tramadol Continue to use night splints as needed for carpal tunnel symptoms are   will see her back in 3 months

## 2012-09-03 ENCOUNTER — Telehealth: Payer: Self-pay | Admitting: Family Medicine

## 2012-09-03 ENCOUNTER — Telehealth: Payer: Self-pay | Admitting: Physical Medicine and Rehabilitation

## 2012-09-03 ENCOUNTER — Telehealth: Payer: Self-pay | Admitting: Nurse Practitioner

## 2012-09-03 NOTE — Telephone Encounter (Signed)
APPT MADE

## 2012-09-03 NOTE — Telephone Encounter (Signed)
Still having a lot pain in hand.  At last visit, Dr. Pamelia Hoit ordered a brace, but it has not improved.  Dr. Pamelia Hoit said if it did not get any better she would make a referral to someone for it.  Pain radiating from hand up arm and around back.  Spoke with PCP about referral to specialist, but they can't see her until 5/16.  Could you make a referral or help her with the pain?

## 2012-09-04 NOTE — Telephone Encounter (Signed)
Still na for pt- will wait for them to call us back.

## 2012-09-04 NOTE — Telephone Encounter (Signed)
Can you add her to my schedule next week?  I can go over her meds, re examine and determine if further testing is needed. Please let her know- If pain is severe, there is new weakness, new balance problems, new bowel or bladder problems, numbness she will need to f/u with urgent care or ED.

## 2012-09-10 ENCOUNTER — Encounter
Payer: Medicaid Other | Attending: Physical Medicine and Rehabilitation | Admitting: Physical Medicine and Rehabilitation

## 2012-09-10 ENCOUNTER — Encounter: Payer: Self-pay | Admitting: Physical Medicine and Rehabilitation

## 2012-09-10 ENCOUNTER — Ambulatory Visit: Payer: Self-pay | Admitting: Family Medicine

## 2012-09-10 VITALS — BP 127/64 | HR 67 | Resp 14 | Ht 63.0 in | Wt 215.8 lb

## 2012-09-10 DIAGNOSIS — R200 Anesthesia of skin: Secondary | ICD-10-CM

## 2012-09-10 DIAGNOSIS — R209 Unspecified disturbances of skin sensation: Secondary | ICD-10-CM

## 2012-09-10 DIAGNOSIS — IMO0002 Reserved for concepts with insufficient information to code with codable children: Secondary | ICD-10-CM | POA: Insufficient documentation

## 2012-09-10 DIAGNOSIS — M47814 Spondylosis without myelopathy or radiculopathy, thoracic region: Secondary | ICD-10-CM | POA: Insufficient documentation

## 2012-09-10 DIAGNOSIS — R202 Paresthesia of skin: Secondary | ICD-10-CM

## 2012-09-10 DIAGNOSIS — M79609 Pain in unspecified limb: Secondary | ICD-10-CM | POA: Insufficient documentation

## 2012-09-10 MED ORDER — CYCLOBENZAPRINE HCL 10 MG PO TABS: 10.0000 mg | ORAL_TABLET | Freq: Three times a day (TID) | ORAL | Status: DC | PRN

## 2012-09-10 MED ORDER — MELOXICAM 7.5 MG PO TABS: 7.5000 mg | ORAL_TABLET | Freq: Every day | ORAL | Status: DC

## 2012-09-10 NOTE — Patient Instructions (Addendum)
I would like to get you set up for electrodiagnostic studies.  I have refilled your Mobic.  Please keep pressure off of your right elbow.

## 2012-09-10 NOTE — Progress Notes (Signed)
Subjective:    Patient ID: Holly Price, female    DOB: [birth year], 62 y.o.   MRN: [id number]  HPI  62 year old woman with CC of right sided thoracic pain which she describes as a 7 on a scale of 10 and fairly constant but does worsen with activities. Pain is described as dull tingling and aching. She reports a numb patch in the right side of her mid back region.  Thoracic MRI 06/25/12 showed  "Mild facet arthropathy T10-11 and T11-12 is  noted. "   This pain has been stable there are no new changes.   She was worked in today because of right hand numbness and tingling localized to the ulnar aspect of her right hand. Over the last few weeks this is gotten worse. She attempted to see primary care but was unable to get appointment this month.  She reports pain/numbness/tingling from medial elbow to ulnar aspect of right hand. She denies trauma or injury. She states it feels like she has hit her crazy bone. She reports a "pinging" sensation over the medial epicondyle when she flexes and extends her arm. She does not report weakness or persistent numbness. Her numbness and tingling has been waxing and waning but particularly aggravating at night. Pain is worse when elbows are flexed. She has had no injury to her neck or other trauma.  Pain is described as 8 on a scale of 10.  She states she has not been taking her gabapentin regularly.    She also has some low back pain complaints and previous periscapular complaints.     Pain Inventory Average Pain 8 Pain Right Now 8 My pain is dull, stabbing and tingling  In the last 24 hours, has pain interfered with the following? General activity 8 Relation with others 8 Enjoyment of life 8 What TIME of day is your pain at its worst? varies Sleep (in general) Poor  Pain is worse with: unsure Pain improves with: nothing Relief from Meds: 0  Mobility walk without assistance  Function Do you have any goals in this area?   no  Neuro/Psych weakness numbness tingling spasms  Prior Studies Any changes since last visit?  no  Physicians involved in your care Any changes since last visit?  no   History reviewed. No pertinent family history. History   Social History  . Marital Status: Married    Spouse Name: N/A    Number of Children: N/A  . Years of Education: N/A   Social History Main Topics  . Smoking status: Former Smoker -- 0.50 packs/day    Types: Cigarettes    Quit date: 06/22/2012  . Smokeless tobacco: None     Comment: 8-10 cigarettes a day  . Alcohol Use: None  . Drug Use: None  . Sexually Active: None   Other Topics Concern  . None   Social History Narrative  . None   Past Surgical History  Procedure Laterality Date  . Cholecystectomy    . Abdominal hysterectomy    . Spine surgery     Past Medical History  Diagnosis Date  . Diabetes mellitus without complication    BP 127/64  Pulse 67  Resp 14  Ht 5\' 3"  (1.6 m)  Wt 215 lb 12.8 oz (97.886 kg)  BMI 38.24 kg/m2  SpO2 95%     Review of Systems  Musculoskeletal:       Spasms  Neurological: Positive for weakness and numbness.       Tingling  All other systems reviewed and are negative.       Objective:   Physical Exam  She is well-developed obese  woman who does not appear in any distress. She is oriented x3. Speech  is clear. Affect is bright. She is alert, cooperative and pleasant.  Follows commands without difficulty answers my questions appropriately  Cranial nerves and coordination are intact  Reflexes are 2+ in the upper extremities at biceps triceps brachioradialis  Reflexes are 2+ in the lower extremities at patellar and Achilles tendon  Hoffman's negative bilat.  No abnormal tone clonus or tremors are noted  Sensation to light touch and pinprick are intact in upper as well as lower extremities  Lower extremity strength is good  Transitions easily from sit to stand  Gait is nonantalgic   Tenderness noted along right scapular border   Right elbow is evaluated. There is no bony abnormality noted. There is no obvious tenderness noted at the medial epicondyle, olecranon. Tinel sign is negative above and below ulnar groove.  Negative tinels over carpal tunnel.  Numbness noted in medial as well as ulnar distribution in right hand.  Motor strength is good in right upper extremity. Coordination is intact.       Assessment & Plan:     1. Right hand numbness and tingling/elbow pain. Developing over the last few weeks. No history of trauma. Symptoms are worse with bent elbow and at night. Has been wearing wrist splints without improvement over the last few weeks.(?whether she feels subluxing ulnar nerve.)  Plan we'll have patient set up for electrodiagnostic studies. Appt available in 2 weeks for studies.  2. Chronic right periscapular pain; thoracic MRI "Stable appearance of the thoracic spine without central canal or foraminal stenosis. Mild facet arthropathy T10-11 and T11-12 is noted. "   3. History of previous carpal tunnel surgery formally with current nocturnal symptoms.

## 2012-09-26 ENCOUNTER — Other Ambulatory Visit: Payer: Self-pay | Admitting: Nurse Practitioner

## 2012-09-26 ENCOUNTER — Other Ambulatory Visit: Payer: Self-pay | Admitting: *Deleted

## 2012-09-26 MED ORDER — LIRAGLUTIDE 18 MG/3ML ~~LOC~~ SOPN: 1.8000 mg | PEN_INJECTOR | Freq: Every day | SUBCUTANEOUS | Status: DC

## 2012-09-26 MED ORDER — ERGOCALCIFEROL 1.25 MG (50000 UT) PO CAPS: 50000.0000 [IU] | ORAL_CAPSULE | ORAL | Status: DC

## 2012-09-26 MED ORDER — LIRAGLUTIDE 18 MG/3ML ~~LOC~~ SOPN: 18.0000 mL | PEN_INJECTOR | Freq: Every day | SUBCUTANEOUS | Status: DC

## 2012-09-26 NOTE — Telephone Encounter (Signed)
LAST OV 12/13. 

## 2012-10-01 ENCOUNTER — Encounter
Payer: Medicaid Other | Attending: Physical Medicine and Rehabilitation | Admitting: Physical Medicine and Rehabilitation

## 2012-10-01 ENCOUNTER — Encounter: Payer: Self-pay | Admitting: Physical Medicine and Rehabilitation

## 2012-10-01 VITALS — BP 154/80 | HR 70 | Resp 14 | Ht 63.0 in | Wt 214.0 lb

## 2012-10-01 DIAGNOSIS — M79609 Pain in unspecified limb: Secondary | ICD-10-CM | POA: Insufficient documentation

## 2012-10-01 DIAGNOSIS — G5601 Carpal tunnel syndrome, right upper limb: Secondary | ICD-10-CM

## 2012-10-01 DIAGNOSIS — G5600 Carpal tunnel syndrome, unspecified upper limb: Secondary | ICD-10-CM

## 2012-10-01 DIAGNOSIS — IMO0002 Reserved for concepts with insufficient information to code with codable children: Secondary | ICD-10-CM | POA: Insufficient documentation

## 2012-10-01 DIAGNOSIS — M47814 Spondylosis without myelopathy or radiculopathy, thoracic region: Secondary | ICD-10-CM | POA: Insufficient documentation

## 2012-10-01 NOTE — Progress Notes (Signed)
Pt for NCV/EMG. Report is located under Media Tab.

## 2012-10-01 NOTE — Patient Instructions (Signed)
Continue to use night splints. Monitor and modify/discontinue activities that can aggravate symptoms as we have discussed.  Follow up in 6-8 weeks.

## 2012-10-15 ENCOUNTER — Ambulatory Visit: Payer: Medicaid Other | Admitting: Physical Medicine and Rehabilitation

## 2012-10-23 ENCOUNTER — Encounter: Payer: Self-pay | Admitting: *Deleted

## 2012-11-07 ENCOUNTER — Ambulatory Visit: Payer: Self-pay | Admitting: Family Medicine

## 2012-11-12 ENCOUNTER — Encounter
Payer: Medicaid Other | Attending: Physical Medicine and Rehabilitation | Admitting: Physical Medicine and Rehabilitation

## 2012-11-12 ENCOUNTER — Encounter: Payer: Self-pay | Admitting: Physical Medicine and Rehabilitation

## 2012-11-12 VITALS — BP 157/69 | HR 71 | Resp 14 | Ht 65.0 in | Wt 213.0 lb

## 2012-11-12 DIAGNOSIS — M79609 Pain in unspecified limb: Secondary | ICD-10-CM | POA: Insufficient documentation

## 2012-11-12 DIAGNOSIS — R209 Unspecified disturbances of skin sensation: Secondary | ICD-10-CM | POA: Insufficient documentation

## 2012-11-12 DIAGNOSIS — R279 Unspecified lack of coordination: Secondary | ICD-10-CM | POA: Insufficient documentation

## 2012-11-12 DIAGNOSIS — M542 Cervicalgia: Secondary | ICD-10-CM

## 2012-11-12 DIAGNOSIS — Z981 Arthrodesis status: Secondary | ICD-10-CM | POA: Insufficient documentation

## 2012-11-12 DIAGNOSIS — M47817 Spondylosis without myelopathy or radiculopathy, lumbosacral region: Secondary | ICD-10-CM

## 2012-11-12 DIAGNOSIS — R200 Anesthesia of skin: Secondary | ICD-10-CM

## 2012-11-12 DIAGNOSIS — M5020 Other cervical disc displacement, unspecified cervical region: Secondary | ICD-10-CM | POA: Insufficient documentation

## 2012-11-12 DIAGNOSIS — M48061 Spinal stenosis, lumbar region without neurogenic claudication: Secondary | ICD-10-CM | POA: Insufficient documentation

## 2012-11-12 DIAGNOSIS — M961 Postlaminectomy syndrome, not elsewhere classified: Secondary | ICD-10-CM

## 2012-11-12 MED ORDER — GABAPENTIN 300 MG PO CAPS: 300.0000 mg | ORAL_CAPSULE | Freq: Three times a day (TID) | ORAL | Status: DC

## 2012-11-12 MED ORDER — MELOXICAM 15 MG PO TABS: 15.0000 mg | ORAL_TABLET | Freq: Every day | ORAL | Status: DC

## 2012-11-12 MED ORDER — TRAMADOL-ACETAMINOPHEN 37.5-325 MG PO TABS: 1.0000 | ORAL_TABLET | Freq: Four times a day (QID) | ORAL | Status: DC | PRN

## 2012-11-12 MED ORDER — CYCLOBENZAPRINE HCL 10 MG PO TABS: 10.0000 mg | ORAL_TABLET | Freq: Three times a day (TID) | ORAL | Status: DC | PRN

## 2012-11-12 NOTE — Patient Instructions (Signed)
Try to be as active as tolerated. 

## 2012-11-12 NOTE — Progress Notes (Signed)
Subjective:    Patient ID: Holly Price, female    DOB: 1951/04/04, 62 y.o.   MRN: [id number]  HPI The patient is a 62 year old female, who presents with neck pain . The symptoms started years ago. The patient complains about moderate to severe pain, which radiates into her entire UE bilateral, and into the right side of her thoracic spine. Patient also complains about constant numbness and tingling in the same areas . She describes the pain as dull, burning sometimes sharp and stabbing pain . Applying ice, taking medications , changing positions alleviate the symptoms. Activity aggrevates the symptoms. She reports that she has not been without pain /numbness for years. The patient grades her pain as a 6 /10 now, but if it is bad it is a 10/10. The patient also complains about increasing balance problems. The patient has a Hx of an ACDF C5-6, done by Dr. Higinio Plan Pain Inventory Average Pain 6 Pain Right Now 6 My pain is dull and tingling  In the last 24 hours, has pain interfered with the following? General activity 5 Relation with others 5 Enjoyment of life 5 What TIME of day is your pain at its worst? daytime Sleep (in general) Fair  Pain is worse with: some activites Pain improves with: pacing activities and medication Relief from Meds: 4  Mobility walk without assistance how many minutes can you walk? 30 ability to climb steps?  yes do you drive?  yes Do you have any goals in this area?  yes  Function retired  Neuro/Psych numbness spasms  Prior Studies Any changes since last visit?  no  Physicians involved in your care Any changes since last visit?  no   History reviewed. No pertinent family history. History   Social History  . Marital Status: Married    Spouse Name: N/A    Number of Children: N/A  . Years of Education: N/A   Social History Main Topics  . Smoking status: Former Smoker -- 0.50 packs/day    Types: Cigarettes    Quit date: 06/22/2012  .  Smokeless tobacco: Never Used     Comment: 8-10 cigarettes a day  . Alcohol Use: None  . Drug Use: None  . Sexually Active: None   Other Topics Concern  . None   Social History Narrative  . None   Past Surgical History  Procedure Laterality Date  . Cholecystectomy    . Abdominal hysterectomy    . Spine surgery     Past Medical History  Diagnosis Date  . Diabetes mellitus without complication    BP 157/69  Pulse 71  Resp 14  Ht 5\' 5"  (1.651 m)  Wt 213 lb (96.616 kg)  BMI 35.44 kg/m2  SpO2 96%     Review of Systems  Musculoskeletal: Positive for back pain.  Neurological: Positive for numbness.  All other systems reviewed and are negative.       Objective:   Physical Exam  Constitutional: She is oriented to person, place, and time. She appears well-developed and well-nourished.  obese  HENT:  Head: Normocephalic.  Musculoskeletal: She exhibits tenderness.  Neurological: She is alert and oriented to person, place, and time.  Skin: Skin is warm and dry.  Psychiatric: She has a normal mood and affect.    Symmetric normal motor tone is noted throughout. Normal muscle bulk. Muscle testing reveals 5/5 muscle strength of the upper extremity, except right deltoid 4-/5;  and 5/5 of the lower extremity. Full range  of motion in upper and lower extremities. ROM of spine is restricted. Fine motor movements are normal in both hands. Sensory is decreased in UE to light touch. DTR in the upper and lower extremity are present and symmetric 2+. No clonus is noted.  Patient arises from chair with mild difficulty. Wide based gait with normal arm swing bilateral , able to walk on heels and toes but slightly difficult . Tandem walk is not possible without stumbeling. No pronator drift. Rhomberg positive, also it is very difficult for the patient to stand with her 2 feet close together.         Assessment & Plan:  This is a 62 year old female with 1.Hx of ACDF C5-6, by Dr.  Higinio Plan 2.Diffuse radiating Sx ( pain, numbness and tingling) into her UE bilateral, also numbness and tingling on the right side of thoracic spine. 3.Adjacent segment disease, MRI in 2006 : C4-5 disc protrusion minimally indenting the spinal cord, MRI in 2009 disc bulging at  C4-5, effaces ventral CSF, potentially contacts the ventral surface of the cord. 4.Increasing balance problems 5. Lumbar spondylosis, L4-5, with foraminal stenosis mild to moderate on the right. 6. Hx of CTS, EMG/NCV done in this office, see at her chart Plan : Ordered MRI of cervical spine, consider further treatment options after we have the result. Increased Gabapentin gradually from 100mg  tid to 300mg  tid, gave patient prescription. Increased Mobic from 7.5 mg , # 10 per month, to 15mg  per day, # 30. Continue with the tramadol 37.5 mg, for now, consider increasing this medication.  Educated patient about cauda equina syndrome, and that she should go to the ED asap if this occures. Spent 25 min with patient face to face. Follow up in 1 month, or earlier if necessary.

## 2012-11-13 ENCOUNTER — Encounter: Payer: Self-pay | Admitting: Family Medicine

## 2012-11-13 ENCOUNTER — Ambulatory Visit (INDEPENDENT_AMBULATORY_CARE_PROVIDER_SITE_OTHER): Payer: Medicaid Other | Admitting: Family Medicine

## 2012-11-13 VITALS — BP 147/61 | HR 76 | Temp 97.4°F | Wt 211.4 lb

## 2012-11-13 DIAGNOSIS — E1159 Type 2 diabetes mellitus with other circulatory complications: Secondary | ICD-10-CM | POA: Insufficient documentation

## 2012-11-13 DIAGNOSIS — E559 Vitamin D deficiency, unspecified: Secondary | ICD-10-CM

## 2012-11-13 DIAGNOSIS — E8881 Metabolic syndrome: Secondary | ICD-10-CM | POA: Insufficient documentation

## 2012-11-13 DIAGNOSIS — E8940 Asymptomatic postprocedural ovarian failure: Secondary | ICD-10-CM

## 2012-11-13 DIAGNOSIS — Z1211 Encounter for screening for malignant neoplasm of colon: Secondary | ICD-10-CM

## 2012-11-13 DIAGNOSIS — L659 Nonscarring hair loss, unspecified: Secondary | ICD-10-CM

## 2012-11-13 DIAGNOSIS — I10 Essential (primary) hypertension: Secondary | ICD-10-CM

## 2012-11-13 DIAGNOSIS — I152 Hypertension secondary to endocrine disorders: Secondary | ICD-10-CM | POA: Insufficient documentation

## 2012-11-13 DIAGNOSIS — E119 Type 2 diabetes mellitus without complications: Secondary | ICD-10-CM | POA: Insufficient documentation

## 2012-11-13 DIAGNOSIS — E8941 Symptomatic postprocedural ovarian failure: Secondary | ICD-10-CM

## 2012-11-13 MED ORDER — GLIMEPIRIDE 4 MG PO TABS: 4.0000 mg | ORAL_TABLET | Freq: Every day | ORAL | Status: DC

## 2012-11-13 MED ORDER — LIRAGLUTIDE 18 MG/3ML ~~LOC~~ SOPN: 1.8000 mg | PEN_INJECTOR | Freq: Every day | SUBCUTANEOUS | Status: DC

## 2012-11-13 MED ORDER — ATENOLOL 50 MG PO TABS: 50.0000 mg | ORAL_TABLET | Freq: Every day | ORAL | Status: DC

## 2012-11-13 MED ORDER — LISINOPRIL-HYDROCHLOROTHIAZIDE 20-25 MG PO TABS: 1.0000 | ORAL_TABLET | Freq: Every day | ORAL | Status: DC

## 2012-11-13 NOTE — Progress Notes (Signed)
Patient ID: Holly Price, female   DOB: 03-21-1951, 62 y.o.   MRN: [id number] SUBJECTIVE: CC: Chief Complaint  Patient presents with  . Follow-up    diabetic, foot exam. hair loss . needs order for future lab.  . Medication Refill    all meds. pt never had a colon  . Tick Removal    pt removed ticks about 6 weeks ago. wants labs    HPI: Exercise: walks a lot Breakfast: usually a bowl of oatmeal egg and a bagel or toast Lunch:sandwich ham or [place] salad Dinner:same as lunch  Patient is here for follow up of Diabetes Mellitus/hypertension/vitamin D Def/ metabolic  syndrome: Symptoms of DM: Denies Nocturia ,Denies Urinary Frequency , denies Blurred vision ,deniesDizziness,denies.Dysuria,denies paresthesias, denies extremity pain or ulcers.Marland Kitchendenies chest pain. has had an annual eye exam. do check the feet. Does check CBGs. Average CBG: could be better Denies episodes of hypoglycemia. Does have an emergency hypoglycemic plan. admits toCompliance with medications. Denies Problems with medications.  PMH/PSH: reviewed/updated in Epic  SH/FH: reviewed/updated in Epic  Allergies: reviewed/updated in Epic  Medications: reviewed/updated in Epic  Immunizations: reviewed/updated in Epic  ROS: As above in the HPI. All other systems are stable or negative.  OBJECTIVE: APPEARANCE:  Patient in no acute distress.The patient appeared well nourished and normally developed. Acyanotic. Waist:45 inches VITAL SIGNS:BP 147/61  Pulse 76  Temp(Src) 97.4 F (36.3 C) (Oral)  Wt 211 lb 6.4 oz (95.89 kg)  BMI 35.18 kg/m2  Obese WF  SKIN: warm and  Dry without overt rashes, tattoos and scars  HEAD and Neck: without JVD, Head and scalp: normal Eyes:No scleral icterus. Fundi normal, eye movements normal. Ears: Auricle normal, canal normal, Tympanic membranes normal, insufflation normal. Nose: normal Throat: normal Neck & thyroid: normal  CHEST & LUNGS: Chest wall: normal Lungs:  Clear  CVS: Reveals the PMI to be normally located. Regular rhythm, First and Second Heart sounds are normal,  absence of murmurs, rubs or gallops.  Peripheral vasculature: Radial pulses: normal Dorsal pedis pulses: normal Posterior pulses: normal  ABDOMEN:  Appearance: obese Benign, no organomegaly, no masses, no Abdominal Aortic enlargement. No Guarding , no rebound. No Bruits. Bowel sounds: normal  RECTAL: N/A GU: N/A  EXTREMETIES: nonedematous. Both Femoral and Pedal pulses are normal.  MUSCULOSKELETAL:  Spine: normal Joints: intact  NEUROLOGIC: oriented to time,place and person; nonfocal. Strength is normal Sensory is normal Reflexes are normal Cranial Nerves are normal.  ASSESSMENT: DM (diabetes mellitus) - Plan: NMR Lipoprofile with Lipids, Microalbumin, urine, Hemoglobin A1c, glimepiride (AMARYL) 4 MG tablet, Liraglutide (VICTOZA) 18 MG/3ML SOPN  HTN (hypertension) - Plan: NMR Lipoprofile with Lipids, atenolol (TENORMIN) 50 MG tablet, lisinopril-hydrochlorothiazide (PRINZIDE,ZESTORETIC) 20-25 MG per tablet  Unspecified vitamin D deficiency - Plan: Vitamin D 25 hydroxy  Metabolic syndrome - Plan: TSH, COMPLETE METABOLIC PANEL WITH GFR  Surgical menopause  Alopecia - Plan: TSH, FSH/LH  Colon cancer screening - Plan: Ambulatory referral to Gastroenterology  Discussed with patient wellness screens and their values. Discussed for 5 minutes Colonoscopy. PLAN:      Dr Woodroe Mode Recommendations  Diet and Exercise discussed with patient.  For nutrition information, I recommend books:  1).Eat to Live by Dr Monico Hoar. 2).Prevent and Reverse Heart Disease by Dr Suzzette Righter. 3) Dr Katherina Right Book:  Program to Reverse Diabetes  Exercise recommendations are:  If unable to walk, then the patient can exercise in a chair 3 times a day. By flapping arms like a bird gently and  raising legs outwards to the front.  If ambulatory, the patient can  go for walks for 30 minutes 3 times a week. Then increase the intensity and duration as tolerated.  Goal is to try to attain exercise frequency to 5 times a week.  If applicable: Best to perform resistance exercises (machines or weights) 2 days a week and cardio type exercises 3 days per week.  Orders Placed This Encounter  Procedures  . TSH    Standing Status: Future     Number of Occurrences:      Standing Expiration Date: 11/13/2013  . COMPLETE METABOLIC PANEL WITH GFR    Standing Status: Future     Number of Occurrences:      Standing Expiration Date: 11/13/2013  . NMR Lipoprofile with Lipids    Standing Status: Future     Number of Occurrences:      Standing Expiration Date: 11/13/2013  . Microalbumin, urine    Standing Status: Future     Number of Occurrences:      Standing Expiration Date: 11/13/2013  . Hemoglobin A1c    Standing Status: Future     Number of Occurrences:      Standing Expiration Date: 11/13/2013  . Vitamin D 25 hydroxy    Standing Status: Future     Number of Occurrences:      Standing Expiration Date: 11/13/2013  . FSH/LH    Standing Status: Future     Number of Occurrences:      Standing Expiration Date: 11/13/2013  . Ambulatory referral to Gastroenterology    Referral Priority:  Routine    Referral Type:  Consultation    Referral Reason:  Specialty Services Required    Requested Specialty:  Gastroenterology    Number of Visits Requested:  1   Meds ordered this encounter  Medications  . DISCONTD: lisinopril-hydrochlorothiazide (PRINZIDE,ZESTORETIC) 20-25 MG per tablet    Sig: Take 1 tablet by mouth daily.  Marland Kitchen DISCONTD: glimepiride (AMARYL) 4 MG tablet    Sig: Take 4 mg by mouth daily before breakfast.  . atenolol (TENORMIN) 50 MG tablet    Sig: Take 1 tablet (50 mg total) by mouth daily.    Dispense:  30 tablet    Refill:  11  . glimepiride (AMARYL) 4 MG tablet    Sig: Take 1 tablet (4 mg total) by mouth daily before breakfast.    Dispense:  30  tablet    Refill:  11  . Liraglutide (VICTOZA) 18 MG/3ML SOPN    Sig: Inject 1.8 mg into the skin daily.    Dispense:  9 pen    Refill:  11    Order Specific Question:  Supervising Provider    Answer:  Ernestina Penna [[id number]]  . lisinopril-hydrochlorothiazide (PRINZIDE,ZESTORETIC) 20-25 MG per tablet    Sig: Take 1 tablet by mouth daily.    Dispense:  30 tablet    Refill:  11   Discussed above  Recommendations for diet and  Exercise and reversing or improving diabetes. Spent 20 minutes on the recommendations above and the DM counselling Spent 5 minutes to discuss colonoscope. Spent 20 minutes doing full exam and doing a thorough foot exam and discussing the need for foot care daily and annual eye exam. Spent 5 minutes reviewing goals for  Weight reduction, waistline size reduction to reduce the effects of the metabolic  Syndrome, and targets for LDLp , LDLc, HGBAC target of 6.4, BMI goals in the mid 20s, etc Spent another  5 minutes reviewing her meds and  Discussing the role of the medications.  Return in about 3 months (around 02/13/2013) for Recheck medical problems.  Libby Goehring P. Modesto Charon, M.D.

## 2012-11-13 NOTE — Patient Instructions (Addendum)
      Dr Camry Theiss's Recommendations  Diet and Exercise discussed with patient.  For nutrition information, I recommend books:  1).Eat to Live by Dr Joel Fuhrman. 2).Prevent and Reverse Heart Disease by Dr Caldwell Esselstyn. 3) Dr Neal Barnard's Book:  Program to Reverse Diabetes  Exercise recommendations are:  If unable to walk, then the patient can exercise in a chair 3 times a day. By flapping arms like a bird gently and raising legs outwards to the front.  If ambulatory, the patient can go for walks for 30 minutes 3 times a week. Then increase the intensity and duration as tolerated.  Goal is to try to attain exercise frequency to 5 times a week.  If applicable: Best to perform resistance exercises (machines or weights) 2 days a week and cardio type exercises 3 days per week.  

## 2012-11-14 ENCOUNTER — Other Ambulatory Visit (INDEPENDENT_AMBULATORY_CARE_PROVIDER_SITE_OTHER): Payer: Medicaid Other

## 2012-11-14 DIAGNOSIS — I10 Essential (primary) hypertension: Secondary | ICD-10-CM

## 2012-11-14 DIAGNOSIS — E559 Vitamin D deficiency, unspecified: Secondary | ICD-10-CM

## 2012-11-14 DIAGNOSIS — E8881 Metabolic syndrome: Secondary | ICD-10-CM

## 2012-11-14 DIAGNOSIS — E119 Type 2 diabetes mellitus without complications: Secondary | ICD-10-CM

## 2012-11-14 DIAGNOSIS — L659 Nonscarring hair loss, unspecified: Secondary | ICD-10-CM

## 2012-11-14 LAB — COMPLETE METABOLIC PANEL WITH GFR
ALT: 18 U/L (ref 0–35)
AST: 18 U/L (ref 0–37)
Albumin: 3.9 g/dL (ref 3.5–5.2)
Alkaline Phosphatase: 41 U/L (ref 39–117)
BUN: 16 mg/dL (ref 6–23)
CO2: 31 mEq/L (ref 19–32)
Calcium: 9.5 mg/dL (ref 8.4–10.5)
Chloride: 102 mEq/L (ref 96–112)
Creat: 0.88 mg/dL (ref 0.50–1.10)
GFR, Est African American: 83 mL/min
GFR, Est Non African American: 72 mL/min
Glucose, Bld: 98 mg/dL (ref 70–99)
Potassium: 4.6 mEq/L (ref 3.5–5.3)
Sodium: 138 mEq/L (ref 135–145)
Total Bilirubin: 0.4 mg/dL (ref 0.3–1.2)
Total Protein: 6.7 g/dL (ref 6.0–8.3)

## 2012-11-14 LAB — TSH: TSH: 1.053 u[IU]/mL (ref 0.350–4.500)

## 2012-11-14 LAB — FSH/LH
FSH: 54.6 m[IU]/mL
LH: 39.5 m[IU]/mL

## 2012-11-14 LAB — HEMOGLOBIN A1C
Hgb A1c MFr Bld: 6 % — ABNORMAL HIGH (ref <5.7)
Mean Plasma Glucose: 126 mg/dL — ABNORMAL HIGH (ref <117)

## 2012-11-15 LAB — MICROALBUMIN, URINE: Microalb, Ur: 0.5 mg/dL (ref 0.00–1.89)

## 2012-11-15 LAB — VITAMIN D 25 HYDROXY (VIT D DEFICIENCY, FRACTURES): Vit D, 25-Hydroxy: 57 ng/mL (ref 30–89)

## 2012-11-17 ENCOUNTER — Other Ambulatory Visit: Payer: Self-pay | Admitting: Nurse Practitioner

## 2012-11-17 DIAGNOSIS — E785 Hyperlipidemia, unspecified: Secondary | ICD-10-CM

## 2012-11-17 LAB — NMR LIPOPROFILE WITH LIPIDS
Cholesterol, Total: 203 mg/dL — ABNORMAL HIGH (ref <200)
HDL Particle Number: 32.6 umol/L (ref >=30.5)
HDL Size: 8.7 nm — ABNORMAL LOW (ref >=9.2)
HDL-C: 42 mg/dL (ref >=40)
LDL (calc): 132 mg/dL — ABNORMAL HIGH (ref <100)
LDL Particle Number: 1924 nmol/L — ABNORMAL HIGH (ref <1000)
LDL Size: 20.4 nm — ABNORMAL LOW (ref >20.5)
LP-IR Score: 75 — ABNORMAL HIGH (ref <=45)
Large HDL-P: 4.2 umol/L — ABNORMAL LOW (ref >=4.8)
Large VLDL-P: 5.3 nmol/L — ABNORMAL HIGH (ref <=2.7)
Small LDL Particle Number: 1151 nmol/L — ABNORMAL HIGH (ref <=527)
Triglycerides: 143 mg/dL (ref <150)
VLDL Size: 54.5 nm — ABNORMAL HIGH (ref <=46.6)

## 2012-11-17 MED ORDER — ATORVASTATIN CALCIUM 40 MG PO TABS: 40.0000 mg | ORAL_TABLET | Freq: Every day | ORAL | Status: DC

## 2012-11-18 ENCOUNTER — Other Ambulatory Visit: Payer: Self-pay | Admitting: Nurse Practitioner

## 2012-11-18 DIAGNOSIS — L659 Nonscarring hair loss, unspecified: Secondary | ICD-10-CM

## 2012-11-20 ENCOUNTER — Other Ambulatory Visit: Payer: Self-pay | Admitting: Physical Medicine & Rehabilitation

## 2012-11-24 ENCOUNTER — Ambulatory Visit (HOSPITAL_COMMUNITY)
Admission: RE | Admit: 2012-11-24 | Discharge: 2012-11-24 | Disposition: A | Discharge status: Home / Self Care | Payer: Medicaid Other | Source: Ambulatory Visit | Attending: Physical Medicine and Rehabilitation | Admitting: Physical Medicine and Rehabilitation

## 2012-11-24 DIAGNOSIS — R202 Paresthesia of skin: Secondary | ICD-10-CM

## 2012-11-24 DIAGNOSIS — M5020 Other cervical disc displacement, unspecified cervical region: Secondary | ICD-10-CM | POA: Insufficient documentation

## 2012-11-24 DIAGNOSIS — M961 Postlaminectomy syndrome, not elsewhere classified: Secondary | ICD-10-CM

## 2012-11-24 DIAGNOSIS — M542 Cervicalgia: Secondary | ICD-10-CM | POA: Insufficient documentation

## 2012-11-26 ENCOUNTER — Telehealth: Payer: Self-pay

## 2012-11-26 NOTE — Telephone Encounter (Signed)
Patient informed of imaging results.  Patient says she was seen in [place] before but does not think the surgeon is practicing.  She does not have preference where she goes now.

## 2012-11-26 NOTE — Telephone Encounter (Signed)
Message copied by Judd Gaudier on Wed Nov 26, 2012  2:16 PM ------      Message from: Su Monks      Created: Tue Nov 25, 2012  3:40 PM       The MRI showed      At C4-5, there is central posterior disc protrusion that indents      the ventral thecal sac and results in mild canal and bilateral      foraminal stenosis, moderate on the right and mild on the left.      We could refer her to neurosurgery, please ask her who her surgery did at C5-6 ------

## 2012-11-27 ENCOUNTER — Telehealth: Payer: Self-pay | Admitting: Family Medicine

## 2012-11-27 NOTE — Telephone Encounter (Signed)
She is medicaid, I will ask Sherron Monday to whom we could refer her, I think the neurosurgeons in [place] are not taking medicaid patients.

## 2012-12-01 ENCOUNTER — Telehealth: Payer: Self-pay | Admitting: Pharmacist

## 2012-12-01 NOTE — Telephone Encounter (Signed)
Patient previously was taking Victoza with good results and not side effects but recently Medicaid formulary change did not include Victoza.  Patient tried Byetta bid for 5 days and experienced nausea, dry mouth and headache.  Will try to get prior auth for Victozia since patient tolerated well.

## 2012-12-03 NOTE — Progress Notes (Signed)
Patient came in for labs only.

## 2012-12-12 ENCOUNTER — Encounter: Payer: Self-pay | Admitting: Physical Medicine and Rehabilitation

## 2012-12-12 ENCOUNTER — Encounter
Payer: Medicaid Other | Attending: Physical Medicine and Rehabilitation | Admitting: Physical Medicine and Rehabilitation

## 2012-12-12 VITALS — BP 135/57 | HR 64 | Resp 16 | Ht 66.0 in | Wt 211.0 lb

## 2012-12-12 DIAGNOSIS — K219 Gastro-esophageal reflux disease without esophagitis: Secondary | ICD-10-CM | POA: Insufficient documentation

## 2012-12-12 DIAGNOSIS — R262 Difficulty in walking, not elsewhere classified: Secondary | ICD-10-CM

## 2012-12-12 DIAGNOSIS — M47817 Spondylosis without myelopathy or radiculopathy, lumbosacral region: Secondary | ICD-10-CM | POA: Insufficient documentation

## 2012-12-12 DIAGNOSIS — M542 Cervicalgia: Secondary | ICD-10-CM | POA: Insufficient documentation

## 2012-12-12 DIAGNOSIS — R209 Unspecified disturbances of skin sensation: Secondary | ICD-10-CM | POA: Insufficient documentation

## 2012-12-12 DIAGNOSIS — I10 Essential (primary) hypertension: Secondary | ICD-10-CM | POA: Insufficient documentation

## 2012-12-12 DIAGNOSIS — M961 Postlaminectomy syndrome, not elsewhere classified: Secondary | ICD-10-CM

## 2012-12-12 DIAGNOSIS — M79609 Pain in unspecified limb: Secondary | ICD-10-CM | POA: Insufficient documentation

## 2012-12-12 DIAGNOSIS — J4489 Other specified chronic obstructive pulmonary disease: Secondary | ICD-10-CM | POA: Insufficient documentation

## 2012-12-12 DIAGNOSIS — E119 Type 2 diabetes mellitus without complications: Secondary | ICD-10-CM | POA: Insufficient documentation

## 2012-12-12 DIAGNOSIS — R279 Unspecified lack of coordination: Secondary | ICD-10-CM | POA: Insufficient documentation

## 2012-12-12 DIAGNOSIS — Z981 Arthrodesis status: Secondary | ICD-10-CM | POA: Insufficient documentation

## 2012-12-12 DIAGNOSIS — M5020 Other cervical disc displacement, unspecified cervical region: Secondary | ICD-10-CM | POA: Insufficient documentation

## 2012-12-12 DIAGNOSIS — R202 Paresthesia of skin: Secondary | ICD-10-CM

## 2012-12-12 DIAGNOSIS — J449 Chronic obstructive pulmonary disease, unspecified: Secondary | ICD-10-CM | POA: Insufficient documentation

## 2012-12-12 NOTE — Progress Notes (Signed)
Subjective:    Patient ID: Holly Price, female    DOB: 1950/10/16, 62 y.o.   MRN: [id number]  HPI The patient is a 62 year old female, who presents with neck pain . The symptoms started years ago. The patient complains about moderate to severe pain, which radiates into her entire UE bilateral, and into the right side of her thoracic spine. Patient also complains about constant numbness and tingling in the same areas . She describes the pain as dull, burning sometimes sharp and stabbing pain . Applying ice, taking medications , changing positions alleviate the symptoms. Activity aggrevates the symptoms. She reports that she has not been without pain /numbness for years. The patient grades her pain as a 6 /10 now, but if it is bad it is a 10/10. The patient also complains about increasing balance problems. She states, that she is doing a little better with the higher dose of Gabapentin, and Mobic, but she still has her Sx and balance problems. The patient has a Hx of an ACDF C5-6, done by Dr. Higinio Plan  Pain Inventory Average Pain 5 Pain Right Now 5 My pain is intermittent and tingling  In the last 24 hours, has pain interfered with the following? General activity 4 Relation with others 4 Enjoyment of life 4 What TIME of day is your pain at its worst? varies Sleep (in general) Poor  Pain is worse with: unsure Pain improves with: medication Relief from Meds: 5  Mobility walk without assistance how many minutes can you walk? 30 ability to climb steps?  yes do you drive?  yes  Function retired  Neuro/Psych numbness tingling trouble walking  Prior Studies Any changes since last visit?  no  Physicians involved in your care Any changes since last visit?  no   Family History  Problem Relation Age of Onset  . Cancer Mother   . Heart attack Father    History   Social History  . Marital Status: Married    Spouse Name: N/A    Number of Children: N/A  . Years of Education:  N/A   Social History Main Topics  . Smoking status: Former Smoker -- 0.50 packs/day    Types: Cigarettes    Quit date: 06/22/2012  . Smokeless tobacco: Never Used     Comment: 8-10 cigarettes a day  . Alcohol Use: None  . Drug Use: None  . Sexually Active: None   Other Topics Concern  . None   Social History Narrative  . None   Past Surgical History  Procedure Laterality Date  . Cholecystectomy    . Abdominal hysterectomy    . Spine surgery    . Foot surgery Bilateral   . Hand surgery Bilateral    Past Medical History  Diagnosis Date  . Diabetes mellitus without complication   . Vitamin D deficiency disease   . Fibromyalgia   . Hypertension   . Depression   . Anxiety   . COPD (chronic obstructive pulmonary disease)   . GERD (gastroesophageal reflux disease)   . DDD (degenerative disc disease)    BP 135/57  Pulse 64  Resp 16  Ht 5\' 6"  (1.676 m)  Wt 211 lb (95.709 kg)  BMI 34.07 kg/m2  SpO2 94%     Review of Systems  Musculoskeletal: Positive for back pain and gait problem.  Neurological: Positive for numbness.  All other systems reviewed and are negative.       Objective:   Physical Exam Constitutional:  She is oriented to person, place, and time. She appears well-developed and well-nourished.  obese  HENT:  Head: Normocephalic.  Musculoskeletal: She exhibits tenderness.  Neurological: She is alert and oriented to person, place, and time.  Skin: Skin is warm and dry.  Psychiatric: She has a normal mood and affect.  Symmetric normal motor tone is noted throughout. Normal muscle bulk. Muscle testing reveals 5/5 muscle strength of the upper extremity, except right deltoid 4-/5; and 5/5 of the lower extremity. Full range of motion in upper and lower extremities. ROM of spine is restricted, worse rotation to the left and flexion. Fine motor movements are normal in both hands.  Sensory is decreased in UE to light touch.  DTR in the upper and lower  extremity are present and symmetric 2+. No clonus is noted.  Patient arises from chair with mild difficulty. Wide based gait with normal arm swing bilateral , able to walk on heels and toes but slightly difficult . Tandem walk is not possible without stumbeling. No pronator drift. Rhomberg positive, also it is very difficult for the patient to stand with her 2 feet close together.         Assessment & Plan:  This is a 62 year old female with  1.Hx of ACDF C5-6, by Dr. Higinio Plan  2.Diffuse radiating Sx ( pain, numbness and tingling) into her UE bilateral, also numbness and tingling on the right side of thoracic spine.  3.Adjacent segment disease, MRI in 2006 : C4-5 disc protrusion minimally indenting the spinal cord,  MRI in 2009 disc bulging at C4-5, effaces ventral CSF, potentially contacts the ventral surface of the cord.  4.Increasing balance problems  5. Lumbar spondylosis, L4-5, with foraminal stenosis mild to moderate on the right.  6. Hx of CTS, EMG/NCV done in this office, see at her chart  Plan :  Ordered MRI of cervical spine,which showed: 1. Postoperative changes from prior ACDF at C5-6 with complete  osseous fusion at these levels. No evidence of hardware  complication.  2. Central disc protrusion at C4-5 just above the fixation hardware  with resultant mild canal and moderate right foraminal stenosis. Referral to neurosurgery placed today.  Increased Gabapentin gradually from 100mg  tid to 300mg  tid, gave patient prescription, which has given her more relief.  Increased Mobic from 7.5 mg , # 10 per month, to 15mg  per day, # 30, which also has helped.  Continue with the tramadol 37.5 mg, for now, consider increasing this medication.  Educated patient about cauda equina syndrome, and that she should go to the ED asap if this occures.  Spent 25 min with patient face to face. Explained MRI results, looked at images with patient, in great detail. Follow up in 1 month, or earlier if  necessary.

## 2012-12-12 NOTE — Patient Instructions (Signed)
Try to stay as active as possible 

## 2012-12-26 ENCOUNTER — Ambulatory Visit: Payer: Medicaid Other | Admitting: General Practice

## 2012-12-29 ENCOUNTER — Encounter: Payer: Self-pay | Admitting: General Practice

## 2012-12-29 ENCOUNTER — Ambulatory Visit (INDEPENDENT_AMBULATORY_CARE_PROVIDER_SITE_OTHER): Payer: Medicaid Other | Admitting: General Practice

## 2012-12-29 VITALS — BP 130/65 | HR 66 | Temp 98.0°F | Ht 66.0 in | Wt 204.0 lb

## 2012-12-29 DIAGNOSIS — E119 Type 2 diabetes mellitus without complications: Secondary | ICD-10-CM

## 2012-12-29 DIAGNOSIS — I10 Essential (primary) hypertension: Secondary | ICD-10-CM

## 2012-12-29 DIAGNOSIS — E785 Hyperlipidemia, unspecified: Secondary | ICD-10-CM

## 2012-12-29 NOTE — Progress Notes (Signed)
  Subjective:    Patient ID: Holly Price, female    DOB: 02-14-1951, 62 y.o.   MRN: [id number]  HPI Patient presents today for 3 month follow up. She has a history of hypertension, diabetes, neuropathy, hyperlipidemia. She reports taking most medications as directed, except lipitor. She reports having muscle aches when taking lipitor and stopped taking 2-3 months ago. She is willing to try something else. Reports checking blood sugars twice daily and keep diary (110's normally), but didn't bring it with her. Reports eating a healthy diet and some regular exercise (walking).     Review of Systems  Constitutional: Negative for fever and chills.  HENT: Negative for neck pain and neck stiffness.   Respiratory: Negative for chest tightness and shortness of breath.   Cardiovascular: Negative for chest pain, palpitations and leg swelling.  Gastrointestinal: Positive for constipation. Negative for vomiting, abdominal pain, diarrhea and blood in stool.       Constipated at times   Genitourinary: Negative for vaginal bleeding and difficulty urinating.  Neurological: Negative for dizziness, weakness and headaches.       Objective:   Physical Exam  Constitutional: She is oriented to person, place, and time. She appears well-developed and well-nourished.  HENT:  Head: Normocephalic and atraumatic.  Right Ear: External ear normal.  Left Ear: External ear normal.  Mouth/Throat: Oropharynx is clear and moist.  Neck: Normal range of motion. Neck supple. No thyromegaly present.  Cardiovascular: Normal rate, regular rhythm and normal heart sounds.   Pulmonary/Chest: Effort normal and breath sounds normal.  Abdominal: Soft. Bowel sounds are normal. She exhibits no distension. There is no tenderness.  Lymphadenopathy:    She has no cervical adenopathy.  Neurological: She is alert and oriented to person, place, and time.  Skin: Skin is warm and dry.  Psychiatric: She has a normal mood and affect.           Assessment & Plan:  1. Essential hypertension, benign  2. Diabetes  3. Other and unspecified hyperlipidemia -discussed medications, continue current medications  -schedule appointment with pharmacist to discuss Vitamin D and dosage -RTO if symptoms develop and in 1 month for 3 month follow up -Patient verbalized understanding -Coralie Keens, FNP-C

## 2012-12-31 ENCOUNTER — Telehealth: Payer: Self-pay | Admitting: Pharmacist

## 2012-12-31 NOTE — Telephone Encounter (Signed)
Spoke with patient about Victoza dose.  She thought she was only taking 1.2mg  but when I discussed with her further she is taking 1.8mg  daily and has been doing so for awhile.   Rx was called to [facility] for this dose 11/13/12.

## 2012-12-31 NOTE — Telephone Encounter (Signed)
Patient was confused about Victoza

## 2013-01-19 ENCOUNTER — Encounter
Payer: Medicaid Other | Attending: Physical Medicine and Rehabilitation | Admitting: Physical Medicine and Rehabilitation

## 2013-01-19 ENCOUNTER — Encounter: Payer: Self-pay | Admitting: Physical Medicine and Rehabilitation

## 2013-01-19 VITALS — BP 150/67 | HR 76 | Resp 14 | Ht 63.0 in | Wt 202.0 lb

## 2013-01-19 DIAGNOSIS — Z5181 Encounter for therapeutic drug level monitoring: Secondary | ICD-10-CM

## 2013-01-19 DIAGNOSIS — R279 Unspecified lack of coordination: Secondary | ICD-10-CM | POA: Insufficient documentation

## 2013-01-19 DIAGNOSIS — R262 Difficulty in walking, not elsewhere classified: Secondary | ICD-10-CM

## 2013-01-19 DIAGNOSIS — M47817 Spondylosis without myelopathy or radiculopathy, lumbosacral region: Secondary | ICD-10-CM | POA: Insufficient documentation

## 2013-01-19 DIAGNOSIS — R209 Unspecified disturbances of skin sensation: Secondary | ICD-10-CM | POA: Insufficient documentation

## 2013-01-19 DIAGNOSIS — Z79899 Other long term (current) drug therapy: Secondary | ICD-10-CM

## 2013-01-19 DIAGNOSIS — Z981 Arthrodesis status: Secondary | ICD-10-CM | POA: Insufficient documentation

## 2013-01-19 DIAGNOSIS — M5020 Other cervical disc displacement, unspecified cervical region: Secondary | ICD-10-CM | POA: Insufficient documentation

## 2013-01-19 DIAGNOSIS — M542 Cervicalgia: Secondary | ICD-10-CM | POA: Insufficient documentation

## 2013-01-19 DIAGNOSIS — M961 Postlaminectomy syndrome, not elsewhere classified: Secondary | ICD-10-CM

## 2013-01-19 NOTE — Progress Notes (Signed)
Subjective:    Patient ID: Holly Price, female    DOB: 1950-12-10, 62 y.o.   MRN: [id number]  HPI The patient is a 62 year old female, who presents with neck pain . The symptoms started years ago. The patient complains about moderate to severe pain, which radiates into her entire UE bilateral, and into the right side of her thoracic spine. Patient also complains about constant numbness and tingling in the same areas . She describes the pain as dull, burning sometimes sharp and stabbing pain . Applying ice, taking medications , changing positions alleviate the symptoms. Activity aggrevates the symptoms. She reports that she has not been without pain /numbness for years. The patient grades her pain as a 6 /10 now, but if it is bad it is a 10/10. The patient also complains about increasing balance problems. She states, that she is doing a little better with the higher dose of Gabapentin, and Mobic, but she still has her Sx and balance problems. She will see a neurosurgeon beginning of October. The patient has a Hx of an ACDF C5-6, done by Dr. Higinio Plan  Pain Inventory Average Pain 5 Pain Right Now 5 My pain is burning, dull, stabbing and tingling  In the last 24 hours, has pain interfered with the following? General activity 5 Relation with others 5 Enjoyment of life 5 What TIME of day is your pain at its worst? daytime, evening Sleep (in general) Fair  Pain is worse with: inactivity and unsure Pain improves with: na Relief from Meds: 5  Mobility walk without assistance how many minutes can you walk? 15 ability to climb steps?  yes do you drive?  yes transfers alone  Function disabled: date disabled na I need assistance with the following:  household duties  Neuro/Psych No problems in this area  Prior Studies Any changes since last visit?  no  Physicians involved in your care Any changes since last visit?  no   Family History  Problem Relation Age of Onset  . Cancer  Mother   . Heart attack Father    History   Social History  . Marital Status: Married    Spouse Name: N/A    Number of Children: N/A  . Years of Education: N/A   Social History Main Topics  . Smoking status: Former Smoker -- 0.50 packs/day    Types: Cigarettes    Quit date: 06/22/2012  . Smokeless tobacco: Never Used     Comment: 8-10 cigarettes a day  . Alcohol Use: None  . Drug Use: None  . Sexual Activity: None   Other Topics Concern  . None   Social History Narrative  . None   Past Surgical History  Procedure Laterality Date  . Cholecystectomy    . Abdominal hysterectomy    . Spine surgery    . Foot surgery Bilateral   . Hand surgery Bilateral    Past Medical History  Diagnosis Date  . Diabetes mellitus without complication   . Vitamin D deficiency disease   . Fibromyalgia   . Hypertension   . Depression   . Anxiety   . COPD (chronic obstructive pulmonary disease)   . GERD (gastroesophageal reflux disease)   . DDD (degenerative disc disease)    BP 150/67  Pulse 76  Resp 14  Ht 5\' 3"  (1.6 m)  Wt 202 lb (91.627 kg)  BMI 35.79 kg/m2  SpO2 95%    Review of Systems  All other systems reviewed and are  negative.       Objective:   Physical Exam Constitutional: She is oriented to person, place, and time. She appears well-developed and well-nourished.  obese  HENT:  Head: Normocephalic.  Musculoskeletal: She exhibits tenderness.  Neurological: She is alert and oriented to person, place, and time.  Skin: Skin is warm and dry.  Psychiatric: She has a normal mood and affect.  Symmetric normal motor tone is noted throughout. Normal muscle bulk. Muscle testing reveals 5/5 muscle strength of the upper extremity, except right deltoid 4-/5; and 5/5 of the lower extremity. Full range of motion in upper and lower extremities. ROM of spine is restricted, worse rotation to the left and flexion. Fine motor movements are normal in both hands.  Sensory is  decreased in UE to light touch.  DTR in the upper and lower extremity are present and symmetric 2+. No clonus is noted.  Patient arises from chair with mild difficulty. Wide based gait with normal arm swing bilateral , able to walk on heels and toes but slightly difficult . Tandem walk is not possible without stumbeling. No pronator drift. Rhomberg positive, also it is very difficult for the patient to stand with her 2 feet close together.        Assessment & Plan:  This is a 62 year old female with  1.Hx of ACDF C5-6, by Dr. Higinio Plan  2.Diffuse radiating Sx ( pain, numbness and tingling) into her UE bilateral, also numbness and tingling on the right side of thoracic spine.  3.Adjacent segment disease, MRI in 2006 : C4-5 disc protrusion minimally indenting the spinal cord,  MRI in 2009 disc bulging at C4-5, effaces ventral CSF, potentially contacts the ventral surface of the cord.  4.Increasing balance problems  5. Lumbar spondylosis, L4-5, with foraminal stenosis mild to moderate on the right.  6. Hx of CTS, EMG/NCV done in this office, see at her chart  Plan :  Ordered MRI of cervical spine,which showed:  1. Postoperative changes from prior ACDF at C5-6 with complete  osseous fusion at these levels. No evidence of hardware  complication.  2. Central disc protrusion at C4-5 just above the fixation hardware  with resultant mild canal and moderate right foraminal stenosis.  Referral to neurosurgery she has her appointment in the beginning of October.  Patient does not want to try PT at this point, she states, that it has not helped in the past, might revisit, after she saw the neurosurgeon.  Increased Gabapentin gradually from 100mg  tid to 300mg  tid, gave patient prescription, which has given her more relief.  Increased Mobic from 7.5 mg , # 10 per month, to 15mg  per day, # 30, which also has helped.  Continue with the tramadol 37.5 mg, for now, consider increasing this medication.  Educated  patient about cauda equina syndrome, and that she should go to the ED asap if this occures.   Follow up in 6 weeks, or earlier if necessary.

## 2013-01-19 NOTE — Patient Instructions (Signed)
Continue with your walking program as long as it is safe.

## 2013-01-20 ENCOUNTER — Encounter: Payer: Self-pay | Admitting: Family Medicine

## 2013-01-30 ENCOUNTER — Other Ambulatory Visit: Payer: Self-pay

## 2013-02-02 ENCOUNTER — Other Ambulatory Visit: Payer: Self-pay

## 2013-02-02 MED ORDER — TRAMADOL-ACETAMINOPHEN 37.5-325 MG PO TABS: 1.0000 | ORAL_TABLET | Freq: Four times a day (QID) | ORAL | Status: DC | PRN

## 2013-02-03 ENCOUNTER — Other Ambulatory Visit: Payer: Self-pay

## 2013-02-03 DIAGNOSIS — E119 Type 2 diabetes mellitus without complications: Secondary | ICD-10-CM

## 2013-02-03 MED ORDER — LIRAGLUTIDE 18 MG/3ML ~~LOC~~ SOPN: 1.8000 mg | PEN_INJECTOR | Freq: Every day | SUBCUTANEOUS | Status: DC

## 2013-02-13 ENCOUNTER — Ambulatory Visit: Payer: Medicaid Other | Admitting: Family Medicine

## 2013-02-24 ENCOUNTER — Encounter: Payer: Self-pay | Admitting: Physical Medicine and Rehabilitation

## 2013-02-24 ENCOUNTER — Encounter
Payer: Medicaid Other | Attending: Physical Medicine and Rehabilitation | Admitting: Physical Medicine and Rehabilitation

## 2013-02-24 VITALS — BP 161/70 | HR 66 | Resp 14 | Ht 63.0 in | Wt 206.6 lb

## 2013-02-24 DIAGNOSIS — M542 Cervicalgia: Secondary | ICD-10-CM

## 2013-02-24 DIAGNOSIS — M79609 Pain in unspecified limb: Secondary | ICD-10-CM | POA: Insufficient documentation

## 2013-02-24 DIAGNOSIS — R209 Unspecified disturbances of skin sensation: Secondary | ICD-10-CM | POA: Insufficient documentation

## 2013-02-24 DIAGNOSIS — M961 Postlaminectomy syndrome, not elsewhere classified: Secondary | ICD-10-CM

## 2013-02-24 DIAGNOSIS — Z981 Arthrodesis status: Secondary | ICD-10-CM | POA: Insufficient documentation

## 2013-02-24 DIAGNOSIS — M5020 Other cervical disc displacement, unspecified cervical region: Secondary | ICD-10-CM | POA: Insufficient documentation

## 2013-02-24 DIAGNOSIS — M549 Dorsalgia, unspecified: Secondary | ICD-10-CM

## 2013-02-24 NOTE — Patient Instructions (Signed)
Follow up with the neurosurgeon, please give me a call after your visit

## 2013-02-24 NOTE — Progress Notes (Signed)
Subjective:    Patient ID: Holly Price, female    DOB: 05-10-1951, 62 y.o.   MRN: [id number]  HPI The patient is a 62 year old female, who presents with neck pain . The symptoms started years ago. The patient complains about moderate to severe pain, which radiates into her entire UE bilateral, and into the right side of her thoracic spine. Patient also complains about constant numbness and tingling in the same areas . She describes the pain as dull, burning sometimes sharp and stabbing pain . Applying ice, taking medications , changing positions alleviate the symptoms. Activity aggrevates the symptoms. She reports that she has not been without pain /numbness for years. The patient grades her pain as a 6 /10 now, but if it is bad it is a 10/10. The patient also complains about increasing balance problems. She states, that she is doing a little better with the higher dose of Gabapentin, and Mobic, but she still has her Sx and balance problems. She will see a neurosurgeon beginning of October.  The patient has a Hx of an ACDF C5-6, done by Dr. Higinio Plan  Pain Inventory Average Pain 6 Pain Right Now 6 My pain is constant, dull and stabbing  In the last 24 hours, has pain interfered with the following? General activity 6 Relation with others 6 Enjoyment of life 6 What TIME of day is your pain at its worst? day and evening Sleep (in general) Poor  Pain is worse with: some activites Pain improves with: medication Relief from Meds: 3  Mobility walk without assistance do you drive?  yes  Function Do you have any goals in this area?  no  Neuro/Psych numbness dizziness  Prior Studies Any changes since last visit?  no  Physicians involved in your care Any changes since last visit?  no   Family History  Problem Relation Age of Onset  . Cancer Mother   . Heart attack Father    History   Social History  . Marital Status: Married    Spouse Name: N/A    Number of Children: N/A  .  Years of Education: N/A   Social History Main Topics  . Smoking status: Former Smoker -- 0.50 packs/day    Types: Cigarettes    Quit date: 06/22/2012  . Smokeless tobacco: Never Used     Comment: 8-10 cigarettes a day  . Alcohol Use: None  . Drug Use: None  . Sexual Activity: None   Other Topics Concern  . None   Social History Narrative  . None   Past Surgical History  Procedure Laterality Date  . Cholecystectomy    . Abdominal hysterectomy    . Spine surgery    . Foot surgery Bilateral   . Hand surgery Bilateral    Past Medical History  Diagnosis Date  . Diabetes mellitus without complication   . Vitamin D deficiency disease   . Fibromyalgia   . Hypertension   . Depression   . Anxiety   . COPD (chronic obstructive pulmonary disease)   . GERD (gastroesophageal reflux disease)   . DDD (degenerative disc disease)    BP 161/70  Pulse 66  Resp 14  Ht 5\' 3"  (1.6 m)  Wt 206 lb 9.6 oz (93.713 kg)  BMI 36.61 kg/m2  SpO2 99%    Review of Systems  HENT: Positive for neck pain.   Neurological: Positive for dizziness and numbness.  All other systems reviewed and are negative.  Objective:   Physical Exam Constitutional: She is oriented to person, place, and time. She appears well-developed and well-nourished.  obese  HENT:  Head: Normocephalic.  Musculoskeletal: She exhibits tenderness.  Neurological: She is alert and oriented to person, place, and time.  Skin: Skin is warm and dry.  Psychiatric: She has a normal mood and affect.  Symmetric normal motor tone is noted throughout. Normal muscle bulk. Muscle testing reveals 5/5 muscle strength of the upper extremity, except right deltoid 4-/5; and 5/5 of the lower extremity. Full range of motion in upper and lower extremities. ROM of spine is restricted, worse rotation to the left and flexion. Fine motor movements are normal in both hands.  Sensory is decreased in UE to light touch.  DTR in the upper and  lower extremity are present and symmetric 2+. No clonus is noted.  Patient arises from chair with mild difficulty. Wide based gait with normal arm swing bilateral , able to walk on heels and toes but slightly difficult . Tandem walk is not possible without stumbeling. No pronator drift. Rhomberg positive, also it is very difficult for the patient to stand with her 2 feet close together.        Assessment & Plan:  This is a 62 year old female with  1.Hx of ACDF C5-6, by Dr. Higinio Plan  2.Diffuse radiating Sx ( pain, numbness and tingling) into her UE bilateral, also numbness and tingling on the right side of thoracic spine.  3.Adjacent segment disease, MRI in 2006 : C4-5 disc protrusion minimally indenting the spinal cord,  MRI in 2009 disc bulging at C4-5, effaces ventral CSF, potentially contacts the ventral surface of the cord.  4.Increasing balance problems  5. Lumbar spondylosis, L4-5, with foraminal stenosis mild to moderate on the right.  6. Hx of CTS, EMG/NCV done in this office, see at her chart  Plan :  Ordered MRI of cervical spine,which showed:  1. Postoperative changes from prior ACDF at C5-6 with complete  osseous fusion at these levels. No evidence of hardware  complication.  2. Central disc protrusion at C4-5 just above the fixation hardware  with resultant mild canal and moderate right foraminal stenosis.  Referral to neurosurgery she has her appointment in the beginning of October.  Patient does not want to try PT at this point, she states, that it has not helped in the past, might revisit, after she saw the neurosurgeon.  Increased Gabapentin gradually from 100mg  tid to 300mg  tid, gave patient prescription, which has given her more relief.  Increased Mobic from 7.5 mg , # 10 per month, to 15mg  per day, # 30, which also has helped.  Continue with the tramadol 37.5 mg, for now, consider increasing this medication.  Educated patient about cauda equina syndrome, and that she  should go to the ED asap if this occures.  Advised patient to give me a call after she saw the neurosurgeon, and let me know what the further treatment plan is, if she will have surgery we might follow up with her later than 1 month. Follow up in 1 month.

## 2013-02-25 ENCOUNTER — Ambulatory Visit (INDEPENDENT_AMBULATORY_CARE_PROVIDER_SITE_OTHER): Payer: Medicaid Other

## 2013-02-25 DIAGNOSIS — Z23 Encounter for immunization: Secondary | ICD-10-CM

## 2013-03-10 ENCOUNTER — Other Ambulatory Visit (HOSPITAL_COMMUNITY): Payer: Self-pay | Admitting: Neurological Surgery

## 2013-03-10 DIAGNOSIS — R209 Unspecified disturbances of skin sensation: Secondary | ICD-10-CM

## 2013-03-11 ENCOUNTER — Ambulatory Visit (HOSPITAL_COMMUNITY)
Admission: RE | Admit: 2013-03-11 | Discharge: 2013-03-11 | Disposition: A | Discharge status: Home / Self Care | Payer: Medicaid Other | Source: Ambulatory Visit | Attending: Neurological Surgery | Admitting: Neurological Surgery

## 2013-03-11 DIAGNOSIS — R930 Abnormal findings on diagnostic imaging of skull and head, not elsewhere classified: Secondary | ICD-10-CM | POA: Insufficient documentation

## 2013-03-11 DIAGNOSIS — R209 Unspecified disturbances of skin sensation: Secondary | ICD-10-CM

## 2013-03-11 LAB — POCT I-STAT CREATININE: Creatinine, Ser: 1.1 mg/dL (ref 0.50–1.10)

## 2013-03-11 MED ORDER — GADOBENATE DIMEGLUMINE 529 MG/ML IV SOLN
19.0000 mL | Freq: Once | INTRAVENOUS | Status: AC | PRN
  Administered 2013-03-11: 19 mL via INTRAVENOUS

## 2013-03-30 ENCOUNTER — Other Ambulatory Visit: Payer: Self-pay

## 2013-03-30 MED ORDER — GABAPENTIN 300 MG PO CAPS: 300.0000 mg | ORAL_CAPSULE | Freq: Three times a day (TID) | ORAL | Status: DC

## 2013-03-30 NOTE — Telephone Encounter (Signed)
Gabapentin Refill sent to Pharmacy

## 2013-04-30 ENCOUNTER — Other Ambulatory Visit: Payer: Self-pay | Admitting: *Deleted

## 2013-04-30 NOTE — Telephone Encounter (Signed)
Patient last seen in office on 12-29-12. Rx written by another office on 02-02-13 with 2 rfs. Please advise. If approved please route to Pool B so nurse can call patient to pick up

## 2013-06-16 ENCOUNTER — Encounter: Payer: Self-pay | Admitting: Physical Medicine & Rehabilitation

## 2013-06-16 ENCOUNTER — Encounter: Payer: Medicaid Other | Attending: Physical Medicine & Rehabilitation

## 2013-06-16 ENCOUNTER — Ambulatory Visit (HOSPITAL_BASED_OUTPATIENT_CLINIC_OR_DEPARTMENT_OTHER): Payer: Medicaid Other | Admitting: Physical Medicine & Rehabilitation

## 2013-06-16 VITALS — BP 141/73 | HR 74 | Resp 14 | Ht 63.0 in | Wt 209.0 lb

## 2013-06-16 DIAGNOSIS — M961 Postlaminectomy syndrome, not elsewhere classified: Secondary | ICD-10-CM | POA: Insufficient documentation

## 2013-06-16 DIAGNOSIS — J449 Chronic obstructive pulmonary disease, unspecified: Secondary | ICD-10-CM | POA: Insufficient documentation

## 2013-06-16 DIAGNOSIS — R200 Anesthesia of skin: Secondary | ICD-10-CM

## 2013-06-16 DIAGNOSIS — I10 Essential (primary) hypertension: Secondary | ICD-10-CM | POA: Insufficient documentation

## 2013-06-16 DIAGNOSIS — R202 Paresthesia of skin: Secondary | ICD-10-CM

## 2013-06-16 DIAGNOSIS — R209 Unspecified disturbances of skin sensation: Secondary | ICD-10-CM | POA: Insufficient documentation

## 2013-06-16 DIAGNOSIS — K219 Gastro-esophageal reflux disease without esophagitis: Secondary | ICD-10-CM | POA: Insufficient documentation

## 2013-06-16 DIAGNOSIS — J4489 Other specified chronic obstructive pulmonary disease: Secondary | ICD-10-CM | POA: Insufficient documentation

## 2013-06-16 DIAGNOSIS — E119 Type 2 diabetes mellitus without complications: Secondary | ICD-10-CM | POA: Insufficient documentation

## 2013-06-16 DIAGNOSIS — G5600 Carpal tunnel syndrome, unspecified upper limb: Secondary | ICD-10-CM | POA: Insufficient documentation

## 2013-06-16 DIAGNOSIS — M549 Dorsalgia, unspecified: Secondary | ICD-10-CM

## 2013-06-16 MED ORDER — GABAPENTIN 100 MG PO CAPS: 300.0000 mg | ORAL_CAPSULE | Freq: Two times a day (BID) | ORAL | Status: DC

## 2013-06-16 MED ORDER — TRAMADOL-ACETAMINOPHEN 37.5-325 MG PO TABS: 1.0000 | ORAL_TABLET | Freq: Four times a day (QID) | ORAL | Status: DC | PRN

## 2013-06-16 NOTE — Progress Notes (Signed)
Subjective:    Patient ID: Holly Price, female    DOB: 1950-06-19, 63 y.o.   MRN: [id number]  HPI R hand numbness all the time, aggravated by activity, dull stabbing pain, throbbing several yrs duration. Rest is helpful R UE EMG showed mild CTS paired studies ulnar/median only abnormality, partial relief on right , permanent relief on left.  Also with mid back numbness  June 30th 2014 MRI cervical spine IMPRESSION:  1. Postoperative changes from prior ACDF at C5-6 with complete  osseous fusion at these levels. No evidence of hardware  complication.  2. Central disc protrusion at C4-5 just above the fixation hardware  with resultant mild canal and moderate right foraminal stenosis.  MRI Brain 03/11/2013 (ordered by Dr Ellene Route )  IMPRESSION:  1. No acute intracranial abnormality.  2. Chronic nonspecific signal changes in the pons in cerebral white  matter without enhancement, stable since 2006.  Thoracic MRI Jul 15, 2012 IMPRESSION:  Stable appearance of the thoracic spine without central canal or  foraminal stenosis. Mild facet arthropathy T10-11 and T11-12 is  noted.    Pain Inventory Average Pain 5 Pain Right Now 6 My pain is dull, stabbing and tingling  In the last 24 hours, has pain interfered with the following? General activity 5 Relation with others 5 Enjoyment of life 5 What TIME of day is your pain at its worst? all day Sleep (in general) Fair  Pain is worse with: walking, sitting and standing Pain improves with: rest, heat/ice, pacing activities and medication Relief from Meds: 5  Mobility walk without assistance how many minutes can you walk? na ability to climb steps?  yes transfers alone Do you have any goals in this area?  yes  Function Do you have any goals in this area?  no  Neuro/Psych bowel control problems  Prior Studies Any changes since last visit?  yes CT/MRI  Physicians involved in your care Any changes since last visit?   no   Family History  Problem Relation Age of Onset  . Cancer Mother   . Heart attack Father    History   Social History  . Marital Status: Married    Spouse Name: N/A    Number of Children: N/A  . Years of Education: N/A   Social History Main Topics  . Smoking status: Former Smoker -- 0.50 packs/day    Types: Cigarettes    Quit date: 06/22/2012  . Smokeless tobacco: Never Used     Comment: 8-10 cigarettes a day  . Alcohol Use: None  . Drug Use: None  . Sexual Activity: None   Other Topics Concern  . None   Social History Narrative  . None   Past Surgical History  Procedure Laterality Date  . Cholecystectomy    . Abdominal hysterectomy    . Spine surgery    . Foot surgery Bilateral   . Hand surgery Bilateral    Past Medical History  Diagnosis Date  . Diabetes mellitus without complication   . Vitamin D deficiency disease   . Fibromyalgia   . Hypertension   . Depression   . Anxiety   . COPD (chronic obstructive pulmonary disease)   . GERD (gastroesophageal reflux disease)   . DDD (degenerative disc disease)    BP 141/73  Pulse 74  Resp 14  Ht 5\' 3"  (1.6 m)  Wt 209 lb (94.802 kg)  BMI 37.03 kg/m2  SpO2 96%    Review of Systems  Genitourinary:  Bowel control problems  Musculoskeletal: Positive for back pain.  All other systems reviewed and are negative.       Objective:   Physical Exam  sensation mildly reduced in the right median nerve distribution   normal sensation in the ulnar distribution as well as in the C5 and C6 dermatomes bilaterally   normal sensation in the left median nerve distribution  Motor strength is 5/5 in bilateral deltoid, bicep, tricep, grip, hip flexors, knee extensors, ankle dorsi flexion plantar flexor Deep tendon reflexes 2+ bilateral brachioradialis biceps triceps Achilles and patellar  No tenderness palpation in the cervical thoracic or lumbar paraspinal muscles.  Negative straight leg raising test        Assessment & Plan:   1. Right hand numbness with EMG evidence of mild carpal tunnel syndrome reports of progression, repeat EMG   2.  Mid Back Numbness w/u negative no associated weakness, no red flags  3.  Cervidal post lami syndrome, cont gabapentin and ultracet

## 2013-06-16 NOTE — Patient Instructions (Signed)
EMG in one month please see pamphlet. Do not use lotion that day

## 2013-07-01 ENCOUNTER — Other Ambulatory Visit: Payer: Self-pay | Admitting: *Deleted

## 2013-07-01 MED ORDER — GLUCOSE BLOOD VI STRP: ORAL_STRIP | Status: DC

## 2013-07-17 ENCOUNTER — Encounter: Payer: Medicaid Other | Attending: Physical Medicine & Rehabilitation

## 2013-07-17 ENCOUNTER — Encounter: Payer: Self-pay | Admitting: Physical Medicine & Rehabilitation

## 2013-07-17 ENCOUNTER — Ambulatory Visit (HOSPITAL_BASED_OUTPATIENT_CLINIC_OR_DEPARTMENT_OTHER): Payer: Medicaid Other | Admitting: Physical Medicine & Rehabilitation

## 2013-07-17 VITALS — BP 132/71 | HR 70 | Resp 14 | Ht 64.0 in | Wt 208.0 lb

## 2013-07-17 DIAGNOSIS — K219 Gastro-esophageal reflux disease without esophagitis: Secondary | ICD-10-CM | POA: Insufficient documentation

## 2013-07-17 DIAGNOSIS — M961 Postlaminectomy syndrome, not elsewhere classified: Secondary | ICD-10-CM

## 2013-07-17 DIAGNOSIS — M5412 Radiculopathy, cervical region: Secondary | ICD-10-CM | POA: Insufficient documentation

## 2013-07-17 DIAGNOSIS — M25519 Pain in unspecified shoulder: Secondary | ICD-10-CM | POA: Insufficient documentation

## 2013-07-17 DIAGNOSIS — I10 Essential (primary) hypertension: Secondary | ICD-10-CM | POA: Insufficient documentation

## 2013-07-17 DIAGNOSIS — E119 Type 2 diabetes mellitus without complications: Secondary | ICD-10-CM | POA: Insufficient documentation

## 2013-07-17 DIAGNOSIS — J4489 Other specified chronic obstructive pulmonary disease: Secondary | ICD-10-CM | POA: Insufficient documentation

## 2013-07-17 DIAGNOSIS — G5600 Carpal tunnel syndrome, unspecified upper limb: Secondary | ICD-10-CM | POA: Insufficient documentation

## 2013-07-17 DIAGNOSIS — J449 Chronic obstructive pulmonary disease, unspecified: Secondary | ICD-10-CM | POA: Insufficient documentation

## 2013-07-17 DIAGNOSIS — R209 Unspecified disturbances of skin sensation: Secondary | ICD-10-CM | POA: Insufficient documentation

## 2013-07-17 MED ORDER — GABAPENTIN 300 MG PO CAPS: 300.0000 mg | ORAL_CAPSULE | Freq: Two times a day (BID) | ORAL | Status: DC

## 2013-07-17 NOTE — Patient Instructions (Signed)
Get shoulder x-rays at [facility]

## 2013-07-17 NOTE — Progress Notes (Signed)
Subjective:    Patient ID: Holly Price, female    DOB: [birth year], 63 y.o.   MRN: [id number]  HPI  70mo hx of R shoulder popping sound and hurts.  Increased pain with movement, pain radiates to upper back and Right arm, no pain with neck ROM.   No falls or recent trauma. No history of shoulder surgeries. Right hand numbness Pt not so concerned , prior history of mild carpal tunnel on previous EMG  Patient originally scheduled for repeat EMG today but did not want to have this done and wanted to talk about her shoulder instead. Pain Inventory Average Pain 5 Pain Right Now 5 My pain is constant and sharp  In the last 24 hours, has pain interfered with the following? General activity 4 Relation with others 4 Enjoyment of life 4 What TIME of day is your pain at its worst? night Sleep (in general) Poor  Pain is worse with: other Pain improves with: heat/ice and medication Relief from Meds: 6  Mobility walk without assistance how many minutes can you walk? 30 ability to climb steps?  yes do you drive?  yes transfers alone Do you have any goals in this area?  yes  Function not employed: date last employed na I need assistance with the following:  household duties  Neuro/Psych numbness tingling spasms  Prior Studies Any changes since last visit?  no C spine MRI June 2014 IMPRESSION:  1. Postoperative changes from prior ACDF at C5-6 with complete  osseous fusion at these levels. No evidence of hardware  complication.  2. Central disc protrusion at C4-5 just above the fixation hardware  with resultant mild canal and moderate right foraminal stenosis.  Physicians involved in your care Any changes since last visit?  no   Family History  Problem Relation Age of Onset  . Cancer Mother   . Heart attack Father    History   Social History  . Marital Status: Married    Spouse Name: N/A    Number of Children: N/A  . Years of Education: N/A   Social History  Main Topics  . Smoking status: Former Smoker -- 0.50 packs/day    Types: Cigarettes    Quit date: 06/22/2012  . Smokeless tobacco: Never Used     Comment: 8-10 cigarettes a day  . Alcohol Use: None  . Drug Use: None  . Sexual Activity: None   Other Topics Concern  . None   Social History Narrative  . None   Past Surgical History  Procedure Laterality Date  . Cholecystectomy    . Abdominal hysterectomy    . Spine surgery    . Foot surgery Bilateral   . Hand surgery Bilateral    Past Medical History  Diagnosis Date  . Diabetes mellitus without complication   . Vitamin D deficiency disease   . Fibromyalgia   . Hypertension   . Depression   . Anxiety   . COPD (chronic obstructive pulmonary disease)   . GERD (gastroesophageal reflux disease)   . DDD (degenerative disc disease)    BP 132/71  Pulse 70  Resp 14  Ht 5\' 4"  (1.626 m)  Wt 208 lb (94.348 kg)  BMI 35.69 kg/m2  SpO2 98%  Opioid Risk Score:   Fall Risk Score: Moderate Fall Risk (6-13 points) (pt educated on fall risk, brochure given to pt.)    Review of Systems  Gastrointestinal: Positive for constipation.  Musculoskeletal: Positive for back pain.  Neurological: Positive  for numbness.       Tingling, spasms  All other systems reviewed and are negative.       Objective:   Physical Exam  Nursing note and vitals reviewed. Constitutional: She is oriented to person, place, and time. She appears well-developed and well-nourished.  HENT:  Head: Normocephalic and atraumatic.  Eyes: Conjunctivae and EOM are normal. Pupils are equal, round, and reactive to light.  Musculoskeletal:       Cervical back: She exhibits normal range of motion.  Foraminal compression test Causes R shouldr pain  Negative impingement test Pain with R shoulder adduction  Neurological: She is alert and oriented to person, place, and time.  Psychiatric: She has a normal mood and affect.          Assessment & Plan:  1.  Right hand numbness with EMG evidence of mild carpal tunnel syndrome reports of progression, repeat EMG  2. Mid Back Numbness w/u negative no associated weakness, no red flags  3. Cervical post lami syndrome, cont gabapentin and ultracet 4.  right shoulder pain appears to be multifactorial. She does have shoulder pain with neck compression. Also shoulder pain with compressing the a.c. joint. Will check x-rays of the shoulder. May need injection under ultrasound guidance if a.c. joint looks arthritic. X-rays do not show much we may consider ultrasound of the shoulder to look for rotator cuff problems

## 2013-08-08 ENCOUNTER — Ambulatory Visit (HOSPITAL_COMMUNITY)
Admission: RE | Admit: 2013-08-08 | Discharge: 2013-08-08 | Disposition: A | Discharge status: Home / Self Care | Payer: Medicaid Other | Source: Ambulatory Visit | Attending: Physical Medicine & Rehabilitation | Admitting: Physical Medicine & Rehabilitation

## 2013-08-08 DIAGNOSIS — M25519 Pain in unspecified shoulder: Secondary | ICD-10-CM | POA: Insufficient documentation

## 2013-08-08 DIAGNOSIS — M898X9 Other specified disorders of bone, unspecified site: Secondary | ICD-10-CM | POA: Insufficient documentation

## 2013-08-17 ENCOUNTER — Encounter: Payer: Self-pay | Admitting: Physical Medicine & Rehabilitation

## 2013-08-17 ENCOUNTER — Ambulatory Visit (HOSPITAL_BASED_OUTPATIENT_CLINIC_OR_DEPARTMENT_OTHER): Payer: Medicaid Other | Admitting: Physical Medicine & Rehabilitation

## 2013-08-17 ENCOUNTER — Encounter: Payer: Medicaid Other | Attending: Physical Medicine & Rehabilitation

## 2013-08-17 VITALS — BP 150/68 | HR 70 | Resp 14 | Ht 63.0 in | Wt 210.0 lb

## 2013-08-17 DIAGNOSIS — J4489 Other specified chronic obstructive pulmonary disease: Secondary | ICD-10-CM | POA: Insufficient documentation

## 2013-08-17 DIAGNOSIS — K219 Gastro-esophageal reflux disease without esophagitis: Secondary | ICD-10-CM | POA: Insufficient documentation

## 2013-08-17 DIAGNOSIS — E119 Type 2 diabetes mellitus without complications: Secondary | ICD-10-CM | POA: Insufficient documentation

## 2013-08-17 DIAGNOSIS — J449 Chronic obstructive pulmonary disease, unspecified: Secondary | ICD-10-CM | POA: Insufficient documentation

## 2013-08-17 DIAGNOSIS — R209 Unspecified disturbances of skin sensation: Secondary | ICD-10-CM | POA: Insufficient documentation

## 2013-08-17 DIAGNOSIS — M961 Postlaminectomy syndrome, not elsewhere classified: Secondary | ICD-10-CM | POA: Insufficient documentation

## 2013-08-17 DIAGNOSIS — M19011 Primary osteoarthritis, right shoulder: Secondary | ICD-10-CM

## 2013-08-17 DIAGNOSIS — M19019 Primary osteoarthritis, unspecified shoulder: Secondary | ICD-10-CM

## 2013-08-17 DIAGNOSIS — I10 Essential (primary) hypertension: Secondary | ICD-10-CM | POA: Insufficient documentation

## 2013-08-17 DIAGNOSIS — G5600 Carpal tunnel syndrome, unspecified upper limb: Secondary | ICD-10-CM | POA: Insufficient documentation

## 2013-08-17 MED ORDER — TRAMADOL-ACETAMINOPHEN 37.5-325 MG PO TABS: 1.0000 | ORAL_TABLET | Freq: Four times a day (QID) | ORAL | Status: DC | PRN

## 2013-08-17 NOTE — Patient Instructions (Signed)

## 2013-08-17 NOTE — Progress Notes (Signed)
Shoulder injection R AC joint With  ultrasound guidance  Indication:Right Shoulder pain not relieved by medication management and other conservative care. Straight demonstrating DJD right a.c. Joint  Informed consent was obtained after describing risks and benefits of the procedure with the patient, this includes bleeding, bruising, infection and medication side effects. The patient wishes to proceed and has given written consent. Patient was placed in a seated position. The Right shoulder was marked and prepped with betadine in the subacromial area. A 25-gauge 1-1/2 inch needle was inserted into the subacromial area. After negative draw back for blood, a solution containing 1 mL of 6 mg per ML betamethasone and 4 mL of 1% lidocaine was injected. A band aid was applied. The patient tolerated the procedure well. Post procedure instructions were given. 

## 2013-09-22 ENCOUNTER — Ambulatory Visit (HOSPITAL_BASED_OUTPATIENT_CLINIC_OR_DEPARTMENT_OTHER): Payer: Medicaid Other | Admitting: Physical Medicine & Rehabilitation

## 2013-09-22 ENCOUNTER — Encounter: Payer: Self-pay | Admitting: Physical Medicine & Rehabilitation

## 2013-09-22 ENCOUNTER — Encounter: Payer: Medicaid Other | Attending: Physical Medicine & Rehabilitation

## 2013-09-22 VITALS — BP 165/63 | HR 88 | Resp 14 | Ht 63.0 in | Wt 212.2 lb

## 2013-09-22 DIAGNOSIS — M961 Postlaminectomy syndrome, not elsewhere classified: Secondary | ICD-10-CM | POA: Insufficient documentation

## 2013-09-22 DIAGNOSIS — K219 Gastro-esophageal reflux disease without esophagitis: Secondary | ICD-10-CM | POA: Insufficient documentation

## 2013-09-22 DIAGNOSIS — J449 Chronic obstructive pulmonary disease, unspecified: Secondary | ICD-10-CM | POA: Insufficient documentation

## 2013-09-22 DIAGNOSIS — G5600 Carpal tunnel syndrome, unspecified upper limb: Secondary | ICD-10-CM | POA: Insufficient documentation

## 2013-09-22 DIAGNOSIS — E119 Type 2 diabetes mellitus without complications: Secondary | ICD-10-CM | POA: Insufficient documentation

## 2013-09-22 DIAGNOSIS — R209 Unspecified disturbances of skin sensation: Secondary | ICD-10-CM

## 2013-09-22 DIAGNOSIS — M79609 Pain in unspecified limb: Secondary | ICD-10-CM

## 2013-09-22 DIAGNOSIS — J4489 Other specified chronic obstructive pulmonary disease: Secondary | ICD-10-CM | POA: Insufficient documentation

## 2013-09-22 DIAGNOSIS — I10 Essential (primary) hypertension: Secondary | ICD-10-CM | POA: Insufficient documentation

## 2013-09-22 NOTE — Progress Notes (Signed)
EMG performed 09/22/2013.  See EMG report under media tab.

## 2013-09-22 NOTE — Patient Instructions (Signed)
EMG monitor needel insertion sites for bleeding/bruising

## 2013-09-26 ENCOUNTER — Other Ambulatory Visit: Payer: Self-pay | Admitting: General Practice

## 2013-10-24 ENCOUNTER — Other Ambulatory Visit: Payer: Self-pay | Admitting: General Practice

## 2013-10-26 ENCOUNTER — Telehealth: Payer: Self-pay

## 2013-10-26 ENCOUNTER — Encounter: Payer: Self-pay | Admitting: Physical Medicine & Rehabilitation

## 2013-10-26 ENCOUNTER — Ambulatory Visit (HOSPITAL_BASED_OUTPATIENT_CLINIC_OR_DEPARTMENT_OTHER): Payer: Medicaid Other | Admitting: Physical Medicine & Rehabilitation

## 2013-10-26 ENCOUNTER — Encounter: Payer: Medicaid Other | Attending: Physical Medicine & Rehabilitation

## 2013-10-26 VITALS — BP 151/71 | HR 67 | Resp 14 | Wt 210.4 lb

## 2013-10-26 DIAGNOSIS — K219 Gastro-esophageal reflux disease without esophagitis: Secondary | ICD-10-CM | POA: Insufficient documentation

## 2013-10-26 DIAGNOSIS — R209 Unspecified disturbances of skin sensation: Secondary | ICD-10-CM | POA: Insufficient documentation

## 2013-10-26 DIAGNOSIS — S43429A Sprain of unspecified rotator cuff capsule, initial encounter: Secondary | ICD-10-CM

## 2013-10-26 DIAGNOSIS — J449 Chronic obstructive pulmonary disease, unspecified: Secondary | ICD-10-CM | POA: Insufficient documentation

## 2013-10-26 DIAGNOSIS — J4489 Other specified chronic obstructive pulmonary disease: Secondary | ICD-10-CM | POA: Insufficient documentation

## 2013-10-26 DIAGNOSIS — G5600 Carpal tunnel syndrome, unspecified upper limb: Secondary | ICD-10-CM | POA: Insufficient documentation

## 2013-10-26 DIAGNOSIS — M961 Postlaminectomy syndrome, not elsewhere classified: Secondary | ICD-10-CM | POA: Insufficient documentation

## 2013-10-26 DIAGNOSIS — I10 Essential (primary) hypertension: Secondary | ICD-10-CM | POA: Insufficient documentation

## 2013-10-26 DIAGNOSIS — E119 Type 2 diabetes mellitus without complications: Secondary | ICD-10-CM | POA: Insufficient documentation

## 2013-10-26 MED ORDER — GABAPENTIN 300 MG PO CAPS: 300.0000 mg | ORAL_CAPSULE | Freq: Two times a day (BID) | ORAL | Status: DC

## 2013-10-26 MED ORDER — TRAMADOL-ACETAMINOPHEN 37.5-325 MG PO TABS: 1.0000 | ORAL_TABLET | Freq: Four times a day (QID) | ORAL | Status: DC | PRN

## 2013-10-26 NOTE — Patient Instructions (Signed)
We could not get good readings on the ultrasound of the shoulder today  I have ordered an MRI of the right shoulder. I will see you back after you have the study done  Continue tramadol twice a day

## 2013-10-26 NOTE — Telephone Encounter (Signed)
Refill request from [facility] for gabapentin 300mg  1 cap bid.  Please advise.

## 2013-10-26 NOTE — Progress Notes (Signed)
   Subjective:    Patient ID: Holly Price, female    DOB: [birth year], 63 y.o.   MRN: [id number]  HPI Patient scheduled for right shoulder ultrasound Pain Inventory Average Pain 6 Pain Right Now 7 My pain is constant, dull and tingling  In the last 24 hours, has pain interfered with the following? General activity 8 Relation with others 8 Enjoyment of life 8 What TIME of day is your pain at its worst? all Sleep (in general) Fair  Pain is worse with: some activites Pain improves with: rest and medication Relief from Meds: 6  Mobility walk without assistance  Function retired  Neuro/Psych No problems in this area  Prior Studies Any changes since last visit?  no  Physicians involved in your care Any changes since last visit?  no   Family History  Problem Relation Age of Onset  . Cancer Mother   . Heart attack Father    History   Social History  . Marital Status: Married    Spouse Name: N/A    Number of Children: N/A  . Years of Education: N/A   Social History Main Topics  . Smoking status: Former Smoker -- 0.50 packs/day    Types: Cigarettes    Quit date: 06/22/2012  . Smokeless tobacco: Never Used     Comment: 8-10 cigarettes a day  . Alcohol Use: None  . Drug Use: None  . Sexual Activity: None   Other Topics Concern  . None   Social History Narrative  . None   Past Surgical History  Procedure Laterality Date  . Cholecystectomy    . Abdominal hysterectomy    . Spine surgery    . Foot surgery Bilateral   . Hand surgery Bilateral    Past Medical History  Diagnosis Date  . Diabetes mellitus without complication   . Vitamin D deficiency disease   . Fibromyalgia   . Hypertension   . Depression   . Anxiety   . COPD (chronic obstructive pulmonary disease)   . GERD (gastroesophageal reflux disease)   . DDD (degenerative disc disease)    BP 151/71  Pulse 67  Resp 14  Wt 210 lb 6.4 oz (95.437 kg)  SpO2 95%  Opioid Risk Score:   Fall  Risk Score: Moderate Fall Risk (6-13 points) (educated and handout given at previous visit for fall prevention in the home)   Review of Systems  Musculoskeletal:       Shoulder pain  All other systems reviewed and are negative.      Objective:   Physical Exam  Pain with abduction and overhead reaching greater than with pain reaching across body      Assessment & Plan:  Attempted musculoskeletal ultrasound of the right shoulder. Because of body habitus for resolution was noted even with reducing transducer frequency We'll schedule for MRI the right shoulder  Followup in one month Discussed with patient agrees with plan

## 2013-10-26 NOTE — Telephone Encounter (Signed)
Electronically sent gabapentin order to [facility].

## 2013-10-26 NOTE — Telephone Encounter (Signed)
Call in Gabapentin 300mg  BID #60, 5 rf

## 2013-11-02 ENCOUNTER — Encounter: Payer: Self-pay | Admitting: Family

## 2013-11-02 ENCOUNTER — Ambulatory Visit (INDEPENDENT_AMBULATORY_CARE_PROVIDER_SITE_OTHER): Payer: Medicaid Other | Admitting: Family

## 2013-11-02 VITALS — BP 137/70 | HR 65 | Temp 98.3°F | Wt 209.8 lb

## 2013-11-02 DIAGNOSIS — M549 Dorsalgia, unspecified: Secondary | ICD-10-CM

## 2013-11-02 DIAGNOSIS — S30861A Insect bite (nonvenomous) of abdominal wall, initial encounter: Secondary | ICD-10-CM

## 2013-11-02 DIAGNOSIS — Z23 Encounter for immunization: Secondary | ICD-10-CM

## 2013-11-02 DIAGNOSIS — I10 Essential (primary) hypertension: Secondary | ICD-10-CM

## 2013-11-02 DIAGNOSIS — E119 Type 2 diabetes mellitus without complications: Secondary | ICD-10-CM

## 2013-11-02 DIAGNOSIS — W57XXXA Bitten or stung by nonvenomous insect and other nonvenomous arthropods, initial encounter: Secondary | ICD-10-CM

## 2013-11-02 DIAGNOSIS — Z0000 Encounter for general adult medical examination without abnormal findings: Secondary | ICD-10-CM

## 2013-11-02 DIAGNOSIS — S30860A Insect bite (nonvenomous) of lower back and pelvis, initial encounter: Secondary | ICD-10-CM

## 2013-11-02 DIAGNOSIS — M25519 Pain in unspecified shoulder: Secondary | ICD-10-CM

## 2013-11-02 LAB — POCT GLYCOSYLATED HEMOGLOBIN (HGB A1C): Hemoglobin A1C: 6

## 2013-11-02 MED ORDER — MELOXICAM 15 MG PO TABS: 15.0000 mg | ORAL_TABLET | Freq: Every day | ORAL | Status: DC

## 2013-11-02 MED ORDER — ATENOLOL 50 MG PO TABS: 50.0000 mg | ORAL_TABLET | Freq: Every day | ORAL | Status: DC

## 2013-11-02 MED ORDER — GABAPENTIN 300 MG PO CAPS: 300.0000 mg | ORAL_CAPSULE | Freq: Two times a day (BID) | ORAL | Status: DC

## 2013-11-02 MED ORDER — LIRAGLUTIDE 18 MG/3ML ~~LOC~~ SOPN: 1.8000 mg | PEN_INJECTOR | Freq: Every day | SUBCUTANEOUS | Status: DC

## 2013-11-02 MED ORDER — CYCLOBENZAPRINE HCL 10 MG PO TABS: 10.0000 mg | ORAL_TABLET | Freq: Three times a day (TID) | ORAL | Status: DC | PRN

## 2013-11-02 MED ORDER — TRAMADOL-ACETAMINOPHEN 37.5-325 MG PO TABS: 1.0000 | ORAL_TABLET | Freq: Four times a day (QID) | ORAL | Status: DC | PRN

## 2013-11-02 NOTE — Addendum Note (Signed)
Addended by: Earlene Plater on: 11/02/2013 12:21 PM   Modules accepted: Orders

## 2013-11-02 NOTE — Patient Instructions (Signed)

## 2013-11-02 NOTE — Progress Notes (Signed)
Subjective:    Patient ID: Holly Price, female    DOB: [birth year], 63 y.o.   MRN: [id number]  Diabetes She presents for her follow-up diabetic visit. She has type 2 diabetes mellitus. Her disease course has been stable. There are no hypoglycemic associated symptoms. Pertinent negatives for hypoglycemia include no confusion or sweats. Associated symptoms include foot paresthesias. Pertinent negatives for diabetes include no blurred vision, no foot ulcerations and no visual change. There are no hypoglycemic complications. There are no diabetic complications. Current diabetic treatment includes oral agent (monotherapy) and diet (Victoza). She is compliant with treatment all of the time. She is following a diabetic diet. She participates in exercise daily. Her breakfast blood glucose range is generally 110-130 mg/dl. An ACE inhibitor/angiotensin II receptor blocker is being taken. Eye exam is current.  Hypertension This is a new problem. The current episode started more than 1 year ago. The problem has been resolved since onset. The problem is controlled. Pertinent negatives include no blurred vision, orthopnea, peripheral edema, shortness of breath or sweats. Risk factors for coronary artery disease include post-menopausal state and obesity. Past treatments include ACE inhibitors, beta blockers and diuretics. The current treatment provides significant improvement. Compliance problems include exercise.  There is no history of kidney disease, CAD/MI or a thyroid problem. There is no history of sleep apnea.  Back Pain/ Right Shoulder Pt currently taking ultracet, mobic, and flexeril as needed. States she doesn't have them all daily just when the pain is "bad". Pt waiting on insurance to approve a MRI.   Review of Systems  HENT: Negative.   Eyes: Negative for blurred vision.  Respiratory: Negative.  Negative for shortness of breath.   Cardiovascular: Negative for orthopnea.  Genitourinary: Negative.    Musculoskeletal: Negative.   Psychiatric/Behavioral: Negative for confusion.  All other systems reviewed and are negative.      Objective:   Physical Exam  Vitals reviewed. Constitutional: She is oriented to person, place, and time. She appears well-developed and well-nourished. No distress.  HENT:  Head: Normocephalic and atraumatic.  Right Ear: External ear normal.  Mouth/Throat: Oropharynx is clear and moist.  Eyes: Pupils are equal, round, and reactive to light.  Neck: Normal range of motion. Neck supple. No thyromegaly present.  Cardiovascular: Normal rate, regular rhythm, normal heart sounds and intact distal pulses.   No murmur heard. Pulmonary/Chest: Effort normal and breath sounds normal. No respiratory distress. She has no wheezes.  Abdominal: Soft. Bowel sounds are normal. She exhibits no distension. There is no tenderness.  Musculoskeletal: Normal range of motion. She exhibits no edema and no tenderness.  Neurological: She is alert and oriented to person, place, and time. She has normal reflexes. No cranial nerve deficit.  Skin: Skin is warm and dry.  Psychiatric: She has a normal mood and affect. Her behavior is normal. Judgment and thought content normal.   *Pt states she has had several tick bites removed from her abdomen. States she has had [facility] Spotted Fever in the past and would like to be tested for that while she is here.   BP 137/70  Pulse 65  Temp(Src) 98.3 F (36.8 C) (Oral)  Wt 209 lb 12.8 oz (95.165 kg)     Assessment & Plan:  1. HTN (hypertension) - CMP14+EGFR - atenolol (TENORMIN) 50 MG tablet; Take 1 tablet (50 mg total) by mouth daily.  Dispense: 30 tablet; Refill: 11  2. DM (diabetes mellitus) - gabapentin (NEURONTIN) 300 MG capsule; Take 1 capsule (  300 mg total) by mouth 2 (two) times daily.  Dispense: 60 capsule; Refill: 11 - Liraglutide (VICTOZA) 18 MG/3ML SOPN; Inject 1.8 mg into the skin daily.  Dispense: 9 pen; Refill: 3  3. Mid  back pain - traMADol-acetaminophen (ULTRACET) 37.5-325 MG per tablet; Take 1 tablet by mouth every 6 (six) hours as needed for moderate pain.  Dispense: 60 tablet; Refill: 2 - cyclobenzaprine (FLEXERIL) 10 MG tablet; Take 1 tablet (10 mg total) by mouth 3 (three) times daily as needed for muscle spasms.  Dispense: 30 tablet; Refill: 1 - gabapentin (NEURONTIN) 300 MG capsule; Take 1 capsule (300 mg total) by mouth 2 (two) times daily.  Dispense: 60 capsule; Refill: 11 - meloxicam (MOBIC) 15 MG tablet; Take 1 tablet (15 mg total) by mouth daily.  Dispense: 30 tablet; Refill: 2  4. Pain in joint, shoulder region - traMADol-acetaminophen (ULTRACET) 37.5-325 MG per tablet; Take 1 tablet by mouth every 6 (six) hours as needed for moderate pain.  Dispense: 60 tablet; Refill: 2 - cyclobenzaprine (FLEXERIL) 10 MG tablet; Take 1 tablet (10 mg total) by mouth 3 (three) times daily as needed for muscle spasms.  Dispense: 30 tablet; Refill: 1 - meloxicam (MOBIC) 15 MG tablet; Take 1 tablet (15 mg total) by mouth daily.  Dispense: 30 tablet; Refill: 2  5. Tick bite of abdomen - [place] m[place] spotted fvr abs pnl(IgG+IgM)  6. Annual physical exam - Lipid panel - Vit D  25 hydroxy (rtn osteoporosis monitoring)   Continue all meds Labs pending Health Maintenance reviewed hemoccult cards given to patient with directions Diet and exercise encouraged RTO 6 months  Evelina Dun, FNP

## 2013-11-04 ENCOUNTER — Other Ambulatory Visit: Payer: Self-pay | Admitting: Family

## 2013-11-04 DIAGNOSIS — E785 Hyperlipidemia, unspecified: Secondary | ICD-10-CM

## 2013-11-04 LAB — ROCKY MTN SPOTTED FVR ABS PNL(IGG+IGM)
RMSF IGG: NEGATIVE
RMSF IGM: 0.16 {index} (ref 0.00–0.89)

## 2013-11-04 LAB — CMP14+EGFR
ALK PHOS: 40 IU/L (ref 39–117)
ALT: 16 IU/L (ref 0–32)
AST: 16 IU/L (ref 0–40)
Albumin/Globulin Ratio: 1.7 (ref 1.1–2.5)
Albumin: 4.2 g/dL (ref 3.6–4.8)
BILIRUBIN TOTAL: 0.2 mg/dL (ref 0.0–1.2)
BUN / CREAT RATIO: 26 (ref 11–26)
BUN: 22 mg/dL (ref 8–27)
CHLORIDE: 103 mmol/L (ref 97–108)
CO2: 24 mmol/L (ref 18–29)
Calcium: 9.3 mg/dL (ref 8.7–10.3)
Creatinine, Ser: 0.85 mg/dL (ref 0.57–1.00)
GFR calc non Af Amer: 75 mL/min/{1.73_m2} (ref >59)
GFR, EST AFRICAN AMERICAN: 86 mL/min/{1.73_m2} (ref >59)
Globulin, Total: 2.5 g/dL (ref 1.5–4.5)
Glucose: 74 mg/dL (ref 65–99)
POTASSIUM: 5.3 mmol/L — AB (ref 3.5–5.2)
Sodium: 145 mmol/L — ABNORMAL HIGH (ref 134–144)
Total Protein: 6.7 g/dL (ref 6.0–8.5)

## 2013-11-04 LAB — LIPID PANEL
CHOLESTEROL TOTAL: 205 mg/dL — AB (ref 100–199)
Chol/HDL Ratio: 3.8 ratio units (ref 0.0–4.4)
HDL: 54 mg/dL (ref >39)
LDL CALC: 116 mg/dL — AB (ref 0–99)
Triglycerides: 173 mg/dL — ABNORMAL HIGH (ref 0–149)
VLDL Cholesterol Cal: 35 mg/dL (ref 5–40)

## 2013-11-04 LAB — LYME AB/WESTERN BLOT REFLEX

## 2013-11-04 LAB — VITAMIN D 25 HYDROXY (VIT D DEFICIENCY, FRACTURES): Vit D, 25-Hydroxy: 49.2 ng/mL (ref 30.0–100.0)

## 2013-11-04 MED ORDER — PRAVASTATIN SODIUM 20 MG PO TABS: 20.0000 mg | ORAL_TABLET | Freq: Every day | ORAL | Status: DC

## 2013-11-06 ENCOUNTER — Telehealth: Payer: Self-pay | Admitting: Family

## 2013-11-06 NOTE — Telephone Encounter (Signed)
Paient aware and made appointment for Rck

## 2013-11-11 ENCOUNTER — Ambulatory Visit (HOSPITAL_COMMUNITY)
Admission: RE | Admit: 2013-11-11 | Discharge: 2013-11-11 | Disposition: A | Discharge status: Home / Self Care | Payer: Medicaid Other | Source: Ambulatory Visit | Attending: Physical Medicine & Rehabilitation | Admitting: Physical Medicine & Rehabilitation

## 2013-11-11 DIAGNOSIS — M67919 Unspecified disorder of synovium and tendon, unspecified shoulder: Secondary | ICD-10-CM | POA: Diagnosis not present

## 2013-11-11 DIAGNOSIS — R937 Abnormal findings on diagnostic imaging of other parts of musculoskeletal system: Secondary | ICD-10-CM | POA: Diagnosis not present

## 2013-11-11 DIAGNOSIS — M25519 Pain in unspecified shoulder: Secondary | ICD-10-CM | POA: Diagnosis present

## 2013-11-11 DIAGNOSIS — M719 Bursopathy, unspecified: Principal | ICD-10-CM | POA: Insufficient documentation

## 2013-11-11 DIAGNOSIS — S43429A Sprain of unspecified rotator cuff capsule, initial encounter: Secondary | ICD-10-CM

## 2013-12-10 ENCOUNTER — Encounter: Payer: Self-pay | Admitting: Registered Nurse

## 2013-12-10 ENCOUNTER — Encounter: Payer: Medicaid Other | Attending: Physical Medicine & Rehabilitation | Admitting: Registered Nurse

## 2013-12-10 VITALS — BP 141/74 | HR 79 | Resp 16 | Ht 63.0 in | Wt 204.0 lb

## 2013-12-10 DIAGNOSIS — M961 Postlaminectomy syndrome, not elsewhere classified: Secondary | ICD-10-CM

## 2013-12-10 DIAGNOSIS — K219 Gastro-esophageal reflux disease without esophagitis: Secondary | ICD-10-CM | POA: Insufficient documentation

## 2013-12-10 DIAGNOSIS — R209 Unspecified disturbances of skin sensation: Secondary | ICD-10-CM | POA: Diagnosis present

## 2013-12-10 DIAGNOSIS — M549 Dorsalgia, unspecified: Secondary | ICD-10-CM

## 2013-12-10 DIAGNOSIS — M5412 Radiculopathy, cervical region: Secondary | ICD-10-CM

## 2013-12-10 DIAGNOSIS — J449 Chronic obstructive pulmonary disease, unspecified: Secondary | ICD-10-CM | POA: Insufficient documentation

## 2013-12-10 DIAGNOSIS — G5601 Carpal tunnel syndrome, right upper limb: Secondary | ICD-10-CM

## 2013-12-10 DIAGNOSIS — G5600 Carpal tunnel syndrome, unspecified upper limb: Secondary | ICD-10-CM | POA: Diagnosis not present

## 2013-12-10 DIAGNOSIS — E119 Type 2 diabetes mellitus without complications: Secondary | ICD-10-CM | POA: Insufficient documentation

## 2013-12-10 DIAGNOSIS — M25511 Pain in right shoulder: Secondary | ICD-10-CM

## 2013-12-10 DIAGNOSIS — Z5181 Encounter for therapeutic drug level monitoring: Secondary | ICD-10-CM

## 2013-12-10 DIAGNOSIS — M25519 Pain in unspecified shoulder: Secondary | ICD-10-CM

## 2013-12-10 DIAGNOSIS — I10 Essential (primary) hypertension: Secondary | ICD-10-CM | POA: Diagnosis not present

## 2013-12-10 DIAGNOSIS — J4489 Other specified chronic obstructive pulmonary disease: Secondary | ICD-10-CM | POA: Insufficient documentation

## 2013-12-10 DIAGNOSIS — Z79899 Other long term (current) drug therapy: Secondary | ICD-10-CM

## 2013-12-10 MED ORDER — DICLOFENAC SODIUM 1 % TD GEL: 2.0000 g | Freq: Three times a day (TID) | TRANSDERMAL | Status: DC

## 2013-12-10 NOTE — Progress Notes (Signed)
Subjective:    Patient ID: Holly Price, female    DOB: 21-Mar-1951, 63 y.o.   MRN: [id number]  HPI: Holly Price is a 63 year old female who returns for follow up for chronic pain and medication refill. She says her pain is located in her right shoulder, mid back and right hand. She rates her pain 6. Her current exercise regime is walking and performing stretching exercises on her right shoulder. She had a MRI of her right shoulder on 11/11/13 IMPRESSION:  Moderate rotator cuff tendinopathy/ tendinosis. No partial or full  thickness retracted tear.  AC joint degenerative changes, inferior spurring from the distal  clavicle, type 2 acromion and mild lateral downsloping of the  acromion may contribute to bony impingement.  Suspect superior labral tear near the biceps anchor but no  detachment.  She is schedule for Right [facility] Joint Shoulder injection on 12/25/13 with Dr. Letta Pate Normal Electrodiagnostic study RUE No evidence of Right Cervical Radiculopathy No eveidence of Right median or ulnar neuropathy.  Pain Inventory Average Pain 7 Pain Right Now 6 My pain is constant, tingling and aching  In the last 24 hours, has pain interfered with the following? General activity 6 Relation with others 6 Enjoyment of life 6 What TIME of day is your pain at its worst? daytime Sleep (in general) Poor  Pain is worse with: some activites Pain improves with: medication Relief from Meds: n/a  Mobility walk without assistance how many minutes can you walk? 5 ability to climb steps?  yes do you drive?  yes Do you have any goals in this area?  yes  Function disabled: date disabled . I need assistance with the following:  meal prep, household duties and shopping Do you have any goals in this area?  yes  Neuro/Psych numbness tingling spasms  Prior Studies Any changes since last visit?  no  Physicians involved in your care Any changes since last visit?  no   Family  History  Problem Relation Age of Onset  . Cancer Mother   . Heart attack Father    History   Social History  . Marital Status: Married    Spouse Name: N/A    Number of Children: N/A  . Years of Education: N/A   Social History Main Topics  . Smoking status: Former Smoker -- 0.50 packs/day    Types: Cigarettes    Quit date: 06/22/2012  . Smokeless tobacco: Never Used     Comment: 8-10 cigarettes a day  . Alcohol Use: None  . Drug Use: None  . Sexual Activity: None   Other Topics Concern  . None   Social History Narrative  . None   Past Surgical History  Procedure Laterality Date  . Cholecystectomy    . Abdominal hysterectomy    . Spine surgery    . Foot surgery Bilateral   . Hand surgery Bilateral    Past Medical History  Diagnosis Date  . Diabetes mellitus without complication   . Vitamin D deficiency disease   . Fibromyalgia   . Hypertension   . Depression   . Anxiety   . COPD (chronic obstructive pulmonary disease)   . GERD (gastroesophageal reflux disease)   . DDD (degenerative disc disease)    BP 141/74  Pulse 79  Resp 16  Ht 5\' 3"  (1.6 m)  Wt 204 lb (92.534 kg)  BMI 36.15 kg/m2  SpO2 97%  Opioid Risk Score:   Fall Risk Score: Moderate  Fall Risk (6-13 points) (patient educated handout declined)   Review of Systems  Neurological: Positive for numbness.       Tingling, spasms  All other systems reviewed and are negative.      Objective:   Physical Exam  Nursing note and vitals reviewed. Constitutional: She is oriented to person, place, and time. She appears well-developed and well-nourished.  HENT:  Head: Normocephalic and atraumatic.  Neck: Normal range of motion. Neck supple.  Cardiovascular: Normal rate and regular rhythm.   Pulmonary/Chest: Effort normal and breath sounds normal.  Musculoskeletal:  Normal Muscle Bulk and Muscle Testing Reveals: Upper Extremities: Full ROM and Muscle Strength 5/5 Right Palm tenderness at Palmar  Digital at Pinky and Ring Finger Spinal Forward Flexion: 30 Degrees and Extension 20 Degrees Greater Trochanteric Tenderness( Right) Arises from chair with ease Narrow Based gait  Neurological: She is alert and oriented to person, place, and time.  Skin: Skin is warm and dry.  Psychiatric: She has a normal mood and affect.          Assessment & Plan:  1. Right hand numbness/ Mild Carpal Tunnel Syndrome:  Carpal Tunnel Steroid Injection Planned for next visit. 2. Mid Back Pain: Continue Heat and Exercise. Continue Tramadol as needed for moderate pain. 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 Continue stretching exercises. 4. Right shoulder pain: Right AC Joint Shoulder Injection on 12/25/13  30 minutes of face to face patient care time was spent during this visit. All questions was encouraged and answered.   F/U in 3 months

## 2013-12-11 ENCOUNTER — Ambulatory Visit: Payer: Medicaid Other | Admitting: Physical Medicine & Rehabilitation

## 2013-12-23 ENCOUNTER — Encounter: Payer: Self-pay | Admitting: Family

## 2013-12-23 ENCOUNTER — Ambulatory Visit (INDEPENDENT_AMBULATORY_CARE_PROVIDER_SITE_OTHER): Payer: Medicaid Other | Admitting: Family

## 2013-12-23 ENCOUNTER — Telehealth: Payer: Self-pay | Admitting: Family

## 2013-12-23 VITALS — BP 116/56 | HR 70 | Temp 98.2°F | Ht 63.0 in | Wt 207.4 lb

## 2013-12-23 DIAGNOSIS — M6530 Trigger finger, unspecified finger: Secondary | ICD-10-CM

## 2013-12-23 DIAGNOSIS — M65341 Trigger finger, right ring finger: Secondary | ICD-10-CM

## 2013-12-23 DIAGNOSIS — M65351 Trigger finger, right little finger: Secondary | ICD-10-CM

## 2013-12-23 NOTE — Telephone Encounter (Signed)
Appt given for today per patients request 

## 2013-12-23 NOTE — Progress Notes (Signed)
   Subjective:    Patient ID: Holly Price, female    DOB: [birth year], 63 y.o.   MRN: [id number]  HPI Pt presents to the office for pain in her right ring finger and pinky. Pt states she has "trigger finger" and the pain has been going on for "at least 8 years". Pt states she had surgery to "release" her middle and thumb when she had "trigger finger" in those 10-15 years ago. Pt states she is just "tired of the pain". Pt states achy pain 8 out 10 in her ring finger and pinky.   Review of Systems  Constitutional: Negative.   HENT: Negative.   Eyes: Negative.   Respiratory: Negative.  Negative for shortness of breath.   Cardiovascular: Negative.  Negative for palpitations.  Gastrointestinal: Negative.   Endocrine: Negative.   Genitourinary: Negative.   Musculoskeletal: Positive for joint swelling.  Neurological: Negative.  Negative for headaches.  Hematological: Negative.   Psychiatric/Behavioral: Negative.   All other systems reviewed and are negative.      Objective:   Physical Exam  Vitals reviewed. Constitutional: She is oriented to person, place, and time. She appears well-developed and well-nourished. No distress.  Cardiovascular: Normal rate, regular rhythm, normal heart sounds and intact distal pulses.   No murmur heard. Pulmonary/Chest: Effort normal and breath sounds normal. No respiratory distress. She has no wheezes.  Musculoskeletal: Normal range of motion. She exhibits tenderness. She exhibits no edema.  Pain and tenderness in palm. Full ROM of fingers.  Neurological: She is alert and oriented to person, place, and time.  Skin: Skin is warm and dry.  Psychiatric: She has a normal mood and affect. Her behavior is normal. Judgment and thought content normal.     BP 116/56  Pulse 70  Temp(Src) 98.2 F (36.8 C) (Oral)  Ht 5\' 3"  (1.6 m)  Wt 207 lb 6.4 oz (94.076 kg)  BMI 36.75 kg/m2      Assessment & Plan:  1. Trigger little finger, right - Ambulatory  referral to Orthopedic Surgery  2. Trigger ring finger of right hand  - Ambulatory referral to Orthopedic Surgery  Ice Rest RTO prn  Evelina Dun, FNP

## 2013-12-23 NOTE — Patient Instructions (Signed)
Trigger Finger Trigger finger (digital tendinitis and stenosing tenosynovitis) is a common disorder that causes an often painful catching of the fingers or thumb. It occurs as a clicking, snapping, or locking of a finger in the palm of the hand. This is caused by a problem with the tendons that flex or bend the fingers sliding smoothly through their sheaths. The condition may occur in any finger or a couple fingers at the same time.  The finger may lock with the finger curled or suddenly straighten out with a snap. This is more common in patients with rheumatoid arthritis and diabetes. Left untreated, the condition may get worse to the point where the finger becomes locked in flexion, like making a fist, or less commonly locked with the finger straightened out. CAUSES   Inflammation and scarring that lead to swelling around the tendon sheath.  Repeated or forceful movements.  Rheumatoid arthritis, an autoimmune disease that affects joints.  Gout.  Diabetes mellitus. SIGNS AND SYMPTOMS  Soreness and swelling of your finger.  A painful clicking or snapping as you bend and straighten your finger. DIAGNOSIS  Your health care provider will do a physical exam of your finger to diagnose trigger finger. TREATMENT   Splinting for 6-8 weeks may be helpful.  Nonsteroidal anti-inflammatory medicines (NSAIDs) can help to relieve the pain and inflammation.  Cortisone injections, along with splinting, may speed up recovery. Several injections may be required. Cortisone may give relief after one injection.  Surgery is another treatment that may be used if conservative treatments do not work. Surgery can be minor, without incisions (a cut does not have to be made), and can be done with a needle through the skin.  Other surgical choices involve an open procedure in which the surgeon opens the hand through a small incision and cuts the pulley so the tendon can again slide smoothly. Your hand will still  work fine. HOME CARE INSTRUCTIONS  Apply ice to the injured area, twice per day:  Put ice in a plastic bag.  Place a towel between your skin and the bag.  Leave the ice on for 20 minutes, 3-4 times a day.  Rest your hand often. MAKE SURE YOU:   Understand these instructions.  Will watch your condition.  Will get help right away if you are not doing well or get worse. Document Released: 03/03/2004 Document Revised: 01/14/2013 Document Reviewed: 10/14/2012 ExitCare Patient Information 2015 [employer]. This information is not intended to replace advice given to you by your health care provider. Make sure you discuss any questions you have with your health care provider.  

## 2013-12-25 ENCOUNTER — Encounter: Payer: Self-pay | Admitting: Physical Medicine & Rehabilitation

## 2013-12-25 ENCOUNTER — Ambulatory Visit (HOSPITAL_BASED_OUTPATIENT_CLINIC_OR_DEPARTMENT_OTHER): Payer: Medicaid Other | Admitting: Physical Medicine & Rehabilitation

## 2013-12-25 VITALS — BP 137/64 | HR 79 | Resp 16 | Ht 63.0 in | Wt 205.0 lb

## 2013-12-25 DIAGNOSIS — R209 Unspecified disturbances of skin sensation: Secondary | ICD-10-CM | POA: Diagnosis not present

## 2013-12-25 DIAGNOSIS — M19019 Primary osteoarthritis, unspecified shoulder: Secondary | ICD-10-CM

## 2013-12-25 DIAGNOSIS — M19011 Primary osteoarthritis, right shoulder: Secondary | ICD-10-CM

## 2013-12-25 NOTE — Patient Instructions (Signed)
Joint Injection  Care After  Refer to this sheet in the next few days. These instructions provide you with information on caring for yourself after you have had a joint injection. Your caregiver also may give you more specific instructions. Your treatment has been planned according to current medical practices, but problems sometimes occur. Call your caregiver if you have any problems or questions after your procedure.  After any type of joint injection, it is not uncommon to experience:  · Soreness, swelling, or bruising around the injection site.  · Mild numbness, tingling, or weakness around the injection site caused by the numbing medicine used before or with the injection.  It also is possible to experience the following effects associated with the specific agent after injection:  · Iodine-based contrast agents:  ¨ Allergic reaction (itching, hives, widespread redness, and swelling beyond the injection site).  · Corticosteroids (These effects are rare.):  ¨ Allergic reaction.  ¨ Increased blood sugar levels (If you have diabetes and you notice that your blood sugar levels have increased, notify your caregiver).  ¨ Increased blood pressure levels.  ¨ Mood swings.  · Hyaluronic acid in the use of viscosupplementation.  ¨ Temporary heat or redness.  ¨ Temporary rash and itching.  ¨ Increased fluid accumulation in the injected joint.  These effects all should resolve within a day after your procedure.   HOME CARE INSTRUCTIONS  · Limit yourself to light activity the day of your procedure. Avoid lifting heavy objects, bending, stooping, or twisting.  · Take prescription or over-the-counter pain medication as directed by your caregiver.  · You may apply ice to your injection site to reduce pain and swelling the day of your procedure. Ice may be applied 03-04 times:  ¨ Put ice in a plastic bag.  ¨ Place a towel between your skin and the bag.  ¨ Leave the ice on for no longer than 15-20 minutes each time.  SEEK  IMMEDIATE MEDICAL CARE IF:   · Pain and swelling get worse rather than better or extend beyond the injection site.  · Numbness does not go away.  · Blood or fluid continues to leak from the injection site.  · You have chest pain.  · You have swelling of your face or tongue.  · You have trouble breathing or you become dizzy.  · You develop a fever, chills, or severe tenderness at the injection site that last longer than 1 day.  MAKE SURE YOU:  · Understand these instructions.  · Watch your condition.  · Get help right away if you are not doing well or if you get worse.  Document Released: 01/25/2011 Document Revised: 08/06/2011 Document Reviewed: 01/25/2011  ExitCare® Patient Information ©2015 [employer]. This information is not intended to replace advice given to you by your health care provider. Make sure you discuss any questions you have with your health care provider.

## 2013-12-25 NOTE — Progress Notes (Signed)
Shoulder injection R AC joint With  ultrasound guidance  Indication:Right Shoulder pain not relieved by medication management and other conservative care. Straight demonstrating DJD right a.c. Joint  Informed consent was obtained after describing risks and benefits of the procedure with the patient, this includes bleeding, bruising, infection and medication side effects. The patient wishes to proceed and has given written consent. Patient was placed in a seated position. The Right shoulder was marked and prepped with betadine in the subacromial area. A 25-gauge 1-1/2 inch needle was inserted into the subacromial area. After negative draw back for blood, a solution containing 1 mL of 6 mg per ML betamethasone and 4 mL of 1% lidocaine was injected. A band aid was applied. The patient tolerated the procedure well. Post procedure instructions were given.

## 2013-12-30 ENCOUNTER — Encounter: Payer: Self-pay | Admitting: Physical Medicine & Rehabilitation

## 2014-01-26 ENCOUNTER — Other Ambulatory Visit: Payer: Self-pay | Admitting: Family Medicine

## 2014-02-10 ENCOUNTER — Encounter: Payer: Self-pay | Admitting: Physical Medicine & Rehabilitation

## 2014-02-15 ENCOUNTER — Ambulatory Visit (INDEPENDENT_AMBULATORY_CARE_PROVIDER_SITE_OTHER): Payer: Medicaid Other | Admitting: Family

## 2014-02-15 ENCOUNTER — Encounter: Payer: Self-pay | Admitting: Family

## 2014-02-15 ENCOUNTER — Telehealth: Payer: Self-pay | Admitting: Family Medicine

## 2014-02-15 VITALS — BP 150/60 | HR 74 | Temp 97.1°F | Ht 63.0 in | Wt 207.0 lb

## 2014-02-15 DIAGNOSIS — E785 Hyperlipidemia, unspecified: Secondary | ICD-10-CM

## 2014-02-15 DIAGNOSIS — I10 Essential (primary) hypertension: Secondary | ICD-10-CM

## 2014-02-15 DIAGNOSIS — E119 Type 2 diabetes mellitus without complications: Secondary | ICD-10-CM

## 2014-02-15 DIAGNOSIS — E1169 Type 2 diabetes mellitus with other specified complication: Secondary | ICD-10-CM | POA: Insufficient documentation

## 2014-02-15 DIAGNOSIS — L659 Nonscarring hair loss, unspecified: Secondary | ICD-10-CM

## 2014-02-15 DIAGNOSIS — E1165 Type 2 diabetes mellitus with hyperglycemia: Secondary | ICD-10-CM

## 2014-02-15 DIAGNOSIS — E559 Vitamin D deficiency, unspecified: Secondary | ICD-10-CM

## 2014-02-15 DIAGNOSIS — M47817 Spondylosis without myelopathy or radiculopathy, lumbosacral region: Secondary | ICD-10-CM

## 2014-02-15 LAB — POCT GLYCOSYLATED HEMOGLOBIN (HGB A1C): Hemoglobin A1C: 5.6

## 2014-02-15 MED ORDER — CYCLOBENZAPRINE HCL 10 MG PO TABS: ORAL_TABLET | ORAL | Status: DC

## 2014-02-15 MED ORDER — ACCU-CHEK AVIVA PLUS W/DEVICE KIT: 1.0000 | PACK | Freq: Two times a day (BID) | Status: DC

## 2014-02-15 MED ORDER — LISINOPRIL-HYDROCHLOROTHIAZIDE 20-12.5 MG PO TABS: 2.0000 | ORAL_TABLET | Freq: Every day | ORAL | Status: DC

## 2014-02-15 NOTE — Addendum Note (Signed)
Addended by: Shelbie Ammons on: 02/15/2014 09:41 AM   Modules accepted: Orders

## 2014-02-15 NOTE — Telephone Encounter (Signed)
appt given for 10/2 with christy

## 2014-02-15 NOTE — Progress Notes (Signed)
Subjective:    Patient ID: Holly Price, female    DOB: April 28, 1951, 63 y.o.   MRN: [id number]  Diabetes She presents for her follow-up diabetic visit. She has type 2 diabetes mellitus. Her disease course has been stable. There are no hypoglycemic associated symptoms. Pertinent negatives for hypoglycemia include no confusion, headaches or sweats. Associated symptoms include foot paresthesias. Pertinent negatives for diabetes include no blurred vision, no foot ulcerations and no visual change. There are no hypoglycemic complications. There are no diabetic complications. Current diabetic treatment includes oral agent (monotherapy) and diet (Victoza). She is compliant with treatment all of the time. She is following a diabetic diet. She participates in exercise daily. Her breakfast blood glucose range is generally 110-130 mg/dl. An ACE inhibitor/angiotensin II receptor blocker is being taken. Eye exam is current.  Hypertension This is a new problem. The current episode started more than 1 year ago. The problem has been resolved since onset. The problem is controlled. Pertinent negatives include no blurred vision, headaches, orthopnea, palpitations, peripheral edema, shortness of breath or sweats. Risk factors for coronary artery disease include post-menopausal state and obesity. Past treatments include ACE inhibitors, beta blockers and diuretics. The current treatment provides significant improvement. Compliance problems include exercise.  There is no history of kidney disease, CAD/MI or a thyroid problem. There is no history of sleep apnea.  Hyperlipidemia This is a new problem. The current episode started more than 1 year ago. The problem is uncontrolled. Recent lipid tests were reviewed and are high. Exacerbating diseases include diabetes. She has no history of hypothyroidism. Factors aggravating her hyperlipidemia include fatty foods. Associated symptoms include myalgias. Pertinent negatives include  no leg pain or shortness of breath. Current antihyperlipidemic treatment includes statins. The current treatment provides mild improvement of lipids. Compliance problems include adherence to exercise.  Risk factors for coronary artery disease include diabetes mellitus, dyslipidemia, family history, hypertension and post-menopausal.      Review of Systems  Constitutional: Negative.   HENT: Negative.   Eyes: Negative.  Negative for blurred vision.  Respiratory: Negative.  Negative for shortness of breath.   Cardiovascular: Negative.  Negative for palpitations and orthopnea.  Gastrointestinal: Negative.   Endocrine: Negative.   Genitourinary: Negative.   Musculoskeletal: Positive for myalgias.  Neurological: Negative.  Negative for headaches.  Hematological: Negative.   Psychiatric/Behavioral: Negative.  Negative for confusion.  All other systems reviewed and are negative.      Objective:   Physical Exam  Vitals reviewed. Constitutional: She is oriented to person, place, and time. She appears well-developed and well-nourished. No distress.  HENT:  Head: Normocephalic and atraumatic.  Right Ear: External ear normal.  Left Ear: External ear normal.  Nose: Nose normal.  Mouth/Throat: Oropharynx is clear and moist.  Eyes: Pupils are equal, round, and reactive to light.  Neck: Normal range of motion. Neck supple. No thyromegaly present.  Cardiovascular: Normal rate, regular rhythm, normal heart sounds and intact distal pulses.   No murmur heard. Pulmonary/Chest: Effort normal and breath sounds normal. No respiratory distress. She has no wheezes.  Abdominal: Soft. Bowel sounds are normal. She exhibits no distension. There is no tenderness.  Musculoskeletal: Normal range of motion. She exhibits no edema and no tenderness.  Neurological: She is alert and oriented to person, place, and time. She has normal reflexes. No cranial nerve deficit.  Skin: Skin is warm and dry.  Psychiatric: She  has a normal mood and affect. Her behavior is normal. Judgment and thought content  normal.    *See Diabetic foot note  BP 150/60  Pulse 74  Temp(Src) 97.1 F (36.2 C) (Oral)  Ht 5' 3"  (1.6 m)  Wt 207 lb (93.895 kg)  BMI 36.68 kg/m2     Assessment & Plan:  1. Essential hypertension -Lisinopril increased 32m daily today  CMP14+EGFR - lisinopril-hydrochlorothiazide (ZESTORETIC) 20-12.5 MG per tablet; Take 2 tablets by mouth daily.  Dispense: 180 tablet; Refill: 3  2. Type 2 diabetes mellitus with hyperglycemia - POCT glycosylated hemoglobin (Hb A1C) - CMP14+EGFR  3. Lumbosacral spondylosis without myelopathy  - CMP14+EGFR - cyclobenzaprine (FLEXERIL) 10 MG tablet; TAKE ONE TABLET BY MOUTH THREE TIMES DAILY AS NEEDED FOR MUSCLE SPASM  Dispense: 30 tablet; Refill: 1  4. Unspecified vitamin D deficiency - CMP14+EGFR - Vit D  25 hydroxy (rtn osteoporosis monitoring)  5. Hyperlipidemia - CMP14+EGFR - Lipid panel  6. Hair loss - Thyroid Panel With TSH   Continue all meds Labs pending Health Maintenance reviewed Diet and exercise encouraged RTO 2 weeks for blood pressure  CEvelina Dun FNP

## 2014-02-15 NOTE — Patient Instructions (Signed)

## 2014-02-16 ENCOUNTER — Other Ambulatory Visit: Payer: Self-pay | Admitting: Family

## 2014-02-16 DIAGNOSIS — E785 Hyperlipidemia, unspecified: Secondary | ICD-10-CM

## 2014-02-16 LAB — CMP14+EGFR
A/G RATIO: 1.9 (ref 1.1–2.5)
ALBUMIN: 4.2 g/dL (ref 3.6–4.8)
ALT: 15 IU/L (ref 0–32)
AST: 14 IU/L (ref 0–40)
Alkaline Phosphatase: 42 IU/L (ref 39–117)
BILIRUBIN TOTAL: 0.2 mg/dL (ref 0.0–1.2)
BUN / CREAT RATIO: 29 — AB (ref 11–26)
BUN: 27 mg/dL (ref 8–27)
CO2: 26 mmol/L (ref 18–29)
CREATININE: 0.94 mg/dL (ref 0.57–1.00)
Calcium: 9.1 mg/dL (ref 8.7–10.3)
Chloride: 97 mmol/L (ref 97–108)
GFR calc non Af Amer: 66 mL/min/{1.73_m2} (ref >59)
GFR, EST AFRICAN AMERICAN: 76 mL/min/{1.73_m2} (ref >59)
GLOBULIN, TOTAL: 2.2 g/dL (ref 1.5–4.5)
Glucose: 107 mg/dL — ABNORMAL HIGH (ref 65–99)
Potassium: 4.4 mmol/L (ref 3.5–5.2)
SODIUM: 140 mmol/L (ref 134–144)
Total Protein: 6.4 g/dL (ref 6.0–8.5)

## 2014-02-16 LAB — LIPID PANEL
Chol/HDL Ratio: 3.8 ratio units (ref 0.0–4.4)
Cholesterol, Total: 199 mg/dL (ref 100–199)
HDL: 53 mg/dL (ref >39)
LDL CALC: 116 mg/dL — AB (ref 0–99)
TRIGLYCERIDES: 152 mg/dL — AB (ref 0–149)
VLDL Cholesterol Cal: 30 mg/dL (ref 5–40)

## 2014-02-16 LAB — THYROID PANEL WITH TSH
Free Thyroxine Index: 2 (ref 1.2–4.9)
T3 UPTAKE RATIO: 26 % (ref 24–39)
T4 TOTAL: 7.6 ug/dL (ref 4.5–12.0)
TSH: 0.548 u[IU]/mL (ref 0.450–4.500)

## 2014-02-16 LAB — VITAMIN D 25 HYDROXY (VIT D DEFICIENCY, FRACTURES): VIT D 25 HYDROXY: 46.2 ng/mL (ref 30.0–100.0)

## 2014-02-16 MED ORDER — PITAVASTATIN CALCIUM 2 MG PO TABS: 2.0000 mg | ORAL_TABLET | Freq: Every day | ORAL | Status: DC

## 2014-02-24 ENCOUNTER — Other Ambulatory Visit: Payer: Self-pay | Admitting: Family

## 2014-02-24 ENCOUNTER — Other Ambulatory Visit: Payer: Self-pay | Admitting: Family Medicine

## 2014-02-24 ENCOUNTER — Ambulatory Visit (INDEPENDENT_AMBULATORY_CARE_PROVIDER_SITE_OTHER): Payer: Medicaid Other

## 2014-02-24 DIAGNOSIS — Z23 Encounter for immunization: Secondary | ICD-10-CM

## 2014-02-26 ENCOUNTER — Ambulatory Visit: Payer: Medicaid Other | Admitting: Family

## 2014-03-25 ENCOUNTER — Encounter: Payer: Self-pay | Admitting: Physical Medicine & Rehabilitation

## 2014-03-25 ENCOUNTER — Encounter: Payer: Medicaid Other | Attending: Physical Medicine & Rehabilitation

## 2014-03-25 ENCOUNTER — Ambulatory Visit (HOSPITAL_BASED_OUTPATIENT_CLINIC_OR_DEPARTMENT_OTHER): Payer: Medicaid Other | Admitting: Physical Medicine & Rehabilitation

## 2014-03-25 VITALS — BP 135/74 | HR 70 | Resp 14 | Ht 63.0 in | Wt 204.4 lb

## 2014-03-25 DIAGNOSIS — M19011 Primary osteoarthritis, right shoulder: Secondary | ICD-10-CM | POA: Insufficient documentation

## 2014-03-25 NOTE — Patient Instructions (Signed)
Repeat in 3 mo

## 2014-03-25 NOTE — Progress Notes (Addendum)
   Subjective:    Patient ID: Holly Price, female    DOB: November 11, 1950, 63 y.o.   MRN: [id number]  HPI     Mobility   Function   Neuro/Psych   Prior Studies   Physicians involved in your care    Family History  Problem Relation Age of Onset  . Cancer Mother   . Heart attack Father    History   Social History  . Marital Status: Married    Spouse Name: N/A    Number of Children: N/A  . Years of Education: N/A   Social History Main Topics  . Smoking status: Former Smoker -- 0.50 packs/day    Types: Cigarettes    Quit date: 06/22/2012  . Smokeless tobacco: Never Used     Comment: 8-10 cigarettes a day  . Alcohol Use: Not on file  . Drug Use: Not on file  . Sexual Activity: Not on file   Other Topics Concern  . Not on file   Social History Narrative  . No narrative on file   Past Surgical History  Procedure Laterality Date  . Cholecystectomy    . Abdominal hysterectomy    . Spine surgery    . Foot surgery Bilateral   . Hand surgery Bilateral    Past Medical History  Diagnosis Date  . Diabetes mellitus without complication   . Vitamin D deficiency disease   . Fibromyalgia   . Hypertension   . Depression   . Anxiety   . COPD (chronic obstructive pulmonary disease)   . GERD (gastroesophageal reflux disease)   . DDD (degenerative disc disease)    There were no vitals taken for this visit.  Opioid Risk Score:   Fall Risk Score:   Review of Systems     Objective:   Physical Exam        Assessment & Plan:  Shoulder injection R AC joint With  ultrasound guidance  Indication:Right Shoulder pain not relieved by medication management and other conservative care. Straight demonstrating DJD right a.c. Joint  Informed consent was obtained after describing risks and benefits of the procedure with the patient, this includes bleeding, bruising, infection and medication side effects. The patient wishes to proceed and has given written consent.  Patient was placed in a seated position. The Right shoulder was marked and prepped with betadine in the subacromial area. A 25-gauge 1-1/2 inch needle was inserted into the subacromial area. After negative draw back for blood, a solution containing 1 mL of 6 mg per ML betamethasone and 4 mL of 1% lidocaine was injected. A band aid was applied. The patient tolerated the procedure well. Post procedure instructions were given.

## 2014-03-25 NOTE — Progress Notes (Deleted)
Shoulder injection R AC joint With  ultrasound guidance  Indication:Right Shoulder pain not relieved by medication management and other conservative care. Straight demonstrating DJD right a.c. Joint  Informed consent was obtained after describing risks and benefits of the procedure with the patient, this includes bleeding, bruising, infection and medication side effects. The patient wishes to proceed and has given written consent. Patient was placed in a seated position. The Right shoulder was marked and prepped with betadine in the subacromial area. A 25-gauge 1-1/2 inch needle was inserted into the subacromial area. After negative draw back for blood, a solution containing 1 mL of 6 mg per ML betamethasone and 4 mL of 1% lidocaine was injected. A band aid was applied. The patient tolerated the procedure well. Post procedure instructions were given.

## 2014-04-25 ENCOUNTER — Other Ambulatory Visit: Payer: Self-pay | Admitting: Family

## 2014-04-29 ENCOUNTER — Other Ambulatory Visit: Payer: Self-pay | Admitting: General Practice

## 2014-05-25 ENCOUNTER — Other Ambulatory Visit: Payer: Self-pay | Admitting: Family

## 2014-06-24 ENCOUNTER — Ambulatory Visit: Payer: Medicaid Other | Admitting: Physical Medicine & Rehabilitation

## 2014-06-27 ENCOUNTER — Other Ambulatory Visit: Payer: Self-pay | Admitting: Family Medicine

## 2014-06-28 NOTE — Telephone Encounter (Signed)
Please advise on refill, patient last seen 02/15/14.

## 2014-07-08 ENCOUNTER — Encounter: Payer: Self-pay | Admitting: Physical Medicine & Rehabilitation

## 2014-07-08 ENCOUNTER — Encounter: Payer: Medicaid Other | Attending: Physical Medicine & Rehabilitation

## 2014-07-08 ENCOUNTER — Ambulatory Visit (HOSPITAL_BASED_OUTPATIENT_CLINIC_OR_DEPARTMENT_OTHER): Payer: Medicaid Other | Admitting: Physical Medicine & Rehabilitation

## 2014-07-08 VITALS — BP 138/61 | HR 70 | Resp 14

## 2014-07-08 DIAGNOSIS — M19011 Primary osteoarthritis, right shoulder: Secondary | ICD-10-CM

## 2014-07-08 NOTE — Patient Instructions (Signed)
Joint Injection  Care After  Refer to this sheet in the next few days. These instructions provide you with information on caring for yourself after you have had a joint injection. Your caregiver also may give you more specific instructions. Your treatment has been planned according to current medical practices, but problems sometimes occur. Call your caregiver if you have any problems or questions after your procedure.  After any type of joint injection, it is not uncommon to experience:  · Soreness, swelling, or bruising around the injection site.  · Mild numbness, tingling, or weakness around the injection site caused by the numbing medicine used before or with the injection.  It also is possible to experience the following effects associated with the specific agent after injection:  · Iodine-based contrast agents:  ¨ Allergic reaction (itching, hives, widespread redness, and swelling beyond the injection site).  · Corticosteroids (These effects are rare.):  ¨ Allergic reaction.  ¨ Increased blood sugar levels (If you have diabetes and you notice that your blood sugar levels have increased, notify your caregiver).  ¨ Increased blood pressure levels.  ¨ Mood swings.  · Hyaluronic acid in the use of viscosupplementation.  ¨ Temporary heat or redness.  ¨ Temporary rash and itching.  ¨ Increased fluid accumulation in the injected joint.  These effects all should resolve within a day after your procedure.   HOME CARE INSTRUCTIONS  · Limit yourself to light activity the day of your procedure. Avoid lifting heavy objects, bending, stooping, or twisting.  · Take prescription or over-the-counter pain medication as directed by your caregiver.  · You may apply ice to your injection site to reduce pain and swelling the day of your procedure. Ice may be applied 03-04 times:  ¨ Put ice in a plastic bag.  ¨ Place a towel between your skin and the bag.  ¨ Leave the ice on for no longer than 15-20 minutes each time.  SEEK  IMMEDIATE MEDICAL CARE IF:   · Pain and swelling get worse rather than better or extend beyond the injection site.  · Numbness does not go away.  · Blood or fluid continues to leak from the injection site.  · You have chest pain.  · You have swelling of your face or tongue.  · You have trouble breathing or you become dizzy.  · You develop a fever, chills, or severe tenderness at the injection site that last longer than 1 day.  MAKE SURE YOU:  · Understand these instructions.  · Watch your condition.  · Get help right away if you are not doing well or if you get worse.  Document Released: 01/25/2011 Document Revised: 08/06/2011 Document Reviewed: 01/25/2011  ExitCare® Patient Information ©2015 [employer]. This information is not intended to replace advice given to you by your health care provider. Make sure you discuss any questions you have with your health care provider.

## 2014-07-08 NOTE — Progress Notes (Signed)
Shoulder injection R AC joint With  ultrasound guidance  Indication:Right Shoulder pain not relieved by medication management and other conservative care. Straight demonstrating DJD right a.c. Joint  Informed consent was obtained after describing risks and benefits of the procedure with the patient, this includes bleeding, bruising, infection and medication side effects. The patient wishes to proceed and has given written consent. Patient was placed in a seated position. The Right shoulder was marked and prepped with betadine in the subacromial area. A 25-gauge 1-1/2 inch needle was inserted into the Right AC joint using long axis views. After negative draw back for blood, a solution containing .5 mL of 6 mg per ML betamethasone and 1 mL of 1% lidocaine was injected. A band aid was applied. The patient tolerated the procedure well. Post procedure instructions were given.

## 2014-07-26 ENCOUNTER — Other Ambulatory Visit: Payer: Self-pay | Admitting: Family

## 2014-07-27 ENCOUNTER — Encounter: Payer: Self-pay | Admitting: Physical Medicine & Rehabilitation

## 2014-07-27 ENCOUNTER — Encounter: Payer: Medicaid Other | Attending: Physical Medicine & Rehabilitation

## 2014-07-27 ENCOUNTER — Ambulatory Visit (HOSPITAL_BASED_OUTPATIENT_CLINIC_OR_DEPARTMENT_OTHER): Payer: Medicaid Other | Admitting: Physical Medicine & Rehabilitation

## 2014-07-27 VITALS — BP 147/54 | HR 69 | Resp 14

## 2014-07-27 DIAGNOSIS — M797 Fibromyalgia: Secondary | ICD-10-CM

## 2014-07-27 DIAGNOSIS — M19011 Primary osteoarthritis, right shoulder: Secondary | ICD-10-CM | POA: Diagnosis not present

## 2014-07-27 DIAGNOSIS — M7918 Myalgia, other site: Secondary | ICD-10-CM

## 2014-07-27 DIAGNOSIS — M549 Dorsalgia, unspecified: Secondary | ICD-10-CM

## 2014-07-27 NOTE — Progress Notes (Signed)
Trigger Point Injection  Indication: Right thoracic paraspinal Myofascial pain not relieved by medication management and other conservative care.  Informed consent was obtained after describing risk and benefits of the procedure with the patient, this includes bleeding, bruising, infection and medication side effects.  The patient wishes to proceed and has given written consent.  The patient was placed in a seated position.  The Right T 8 and T 10 area was marked and prepped with Betadine.  It was entered with a 25-gauge 1-1/2 inch needle and 1 mL of 1% lidocaine was injected into each of 3 trigger points, after negative draw back for blood.  The patient tolerated the procedure well.  Post procedure instructions were given.

## 2014-07-27 NOTE — Patient Instructions (Signed)
Trigger Point Injection Trigger points are areas where you have muscle pain. A trigger point injection is a shot given in the trigger point to relieve that pain. A trigger point might feel like a knot in your muscle. It hurts to press on a trigger point. Sometimes the pain spreads out (radiates) to other parts of the body. For example, pressing on a trigger point in your shoulder might cause pain in your arm or neck. You might have one trigger point. Or, you might have more than one. People often have trigger points in their upper back and lower back. They also occur often in the neck and shoulders. Pain from a trigger point lasts for a long time. It can make it hard to keep moving. You might not be able to do the exercise or physical therapy that could help you deal with the pain. A trigger point injection may help. It does not work for everyone. But, it may relieve your pain for a few days or a few months. A trigger point injection does not cure long-lasting (chronic) pain. LET YOUR CAREGIVER KNOW ABOUT:  Any allergies (especially to latex, lidocaine, or steroids).  Blood-thinning medicines that you take. These drugs can lead to bleeding or bruising after an injection. They include:  Aspirin.  Ibuprofen.  Clopidogrel.  Warfarin.  Other medicines you take. This includes all vitamins, herbs, eyedrops, over-the-counter medicines, and creams.  Use of steroids.  Recent infections.  Past problems with numbing medicines.  Bleeding problems.  Surgeries you have had.  Other health problems. RISKS AND COMPLICATIONS A trigger point injection is a safe treatment. However, problems may develop, such as:  Minor side effects usually go away in 1 to 2 days. These may include:  Soreness.  Bruising.  Stiffness.  More serious problems are rare. But, they may include:  Bleeding under the skin (hematoma).  Skin infection.  Breaking off of the needle under your skin.  Lung  puncture.  The trigger point injection may not work for you. BEFORE THE PROCEDURE You may need to stop taking any medicine that thins your blood. This is to prevent bleeding and bruising. Usually these medicines are stopped several days before the injection. No other preparation is needed. PROCEDURE  A trigger point injection can be given in your caregiver's office or in a clinic. Each injection takes 2 minutes or less.  Your caregiver will feel for trigger points. The caregiver may use a marker to circle the area for the injection.  The skin over the trigger point will be washed with a germ-killing (antiseptic) solution.  The caregiver pinches the spot for the injection.  Then, a very thin needle is used for the shot. You may feel pain or a twitching feeling when the needle enters the trigger point.  A numbing solution may be injected into the trigger point. Sometimes a drug to keep down swelling, redness, and warmth (inflammation) is also injected.  Your caregiver moves the needle around the trigger zone until the tightness and twitching goes away.  After the injection, your caregiver may put gentle pressure over the injection site.  Then it is covered with a bandage. AFTER THE PROCEDURE  You can go right home after the injection.  The bandage can be taken off after a few hours.  You may feel sore and stiff for 1 to 2 days.  Go back to your regular activities slowly. Your caregiver may ask you to stretch your muscles. Do not do anything that takes   extra energy for a few days.  Follow your caregiver's instructions to manage and treat other pain. Document Released: 05/03/2011 Document Revised: 09/08/2012 Document Reviewed: 05/03/2011 [facility] Patient Information 2015 [place], [place]. This information is not intended to replace advice given to you by your health care provider. Make sure you discuss any questions you have with your health care provider.

## 2014-07-29 ENCOUNTER — Other Ambulatory Visit: Payer: Self-pay | Admitting: Family

## 2014-08-26 ENCOUNTER — Other Ambulatory Visit: Payer: Self-pay | Admitting: Family

## 2014-08-27 NOTE — Telephone Encounter (Signed)
Last seen 02/15/14 [facility]

## 2014-09-23 ENCOUNTER — Encounter: Payer: Medicaid Other | Attending: Physical Medicine & Rehabilitation

## 2014-09-23 ENCOUNTER — Encounter: Payer: Self-pay | Admitting: Physical Medicine & Rehabilitation

## 2014-09-23 ENCOUNTER — Ambulatory Visit (HOSPITAL_BASED_OUTPATIENT_CLINIC_OR_DEPARTMENT_OTHER): Payer: Medicaid Other | Admitting: Physical Medicine & Rehabilitation

## 2014-09-23 VITALS — BP 133/70 | HR 69 | Resp 14

## 2014-09-23 DIAGNOSIS — M19011 Primary osteoarthritis, right shoulder: Secondary | ICD-10-CM | POA: Diagnosis not present

## 2014-09-23 NOTE — Progress Notes (Signed)
Shoulder injection R AC joint With  ultrasound guidance  Indication:Right Shoulder pain not relieved by medication management and other conservative care. Straight demonstrating DJD right a.c. Joint  Informed consent was obtained after describing risks and benefits of the procedure with the patient, this includes bleeding, bruising, infection and medication side effects. The patient wishes to proceed and has given written consent. Patient was placed in a seated position. The Right shoulder was marked and prepped with betadine in the subacromial area. A 25-gauge 1-1/2 inch needle was inserted into the Right AC joint using long axis views. After negative draw back for blood, a solution containing .5 mL of 6 mg per ML betamethasone and 1 mL of 1% lidocaine was injected. A band aid was applied. The patient tolerated the procedure well. Post procedure instructions were given.

## 2014-09-23 NOTE — Patient Instructions (Signed)
Joint Injection  Care After  Refer to this sheet in the next few days. These instructions provide you with information on caring for yourself after you have had a joint injection. Your caregiver also may give you more specific instructions. Your treatment has been planned according to current medical practices, but problems sometimes occur. Call your caregiver if you have any problems or questions after your procedure.  After any type of joint injection, it is not uncommon to experience:  · Soreness, swelling, or bruising around the injection site.  · Mild numbness, tingling, or weakness around the injection site caused by the numbing medicine used before or with the injection.  It also is possible to experience the following effects associated with the specific agent after injection:  · Iodine-based contrast agents:  ¨ Allergic reaction (itching, hives, widespread redness, and swelling beyond the injection site).  · Corticosteroids (These effects are rare.):  ¨ Allergic reaction.  ¨ Increased blood sugar levels (If you have diabetes and you notice that your blood sugar levels have increased, notify your caregiver).  ¨ Increased blood pressure levels.  ¨ Mood swings.  · Hyaluronic acid in the use of viscosupplementation.  ¨ Temporary heat or redness.  ¨ Temporary rash and itching.  ¨ Increased fluid accumulation in the injected joint.  These effects all should resolve within a day after your procedure.   HOME CARE INSTRUCTIONS  · Limit yourself to light activity the day of your procedure. Avoid lifting heavy objects, bending, stooping, or twisting.  · Take prescription or over-the-counter pain medication as directed by your caregiver.  · You may apply ice to your injection site to reduce pain and swelling the day of your procedure. Ice may be applied 03-04 times:  ¨ Put ice in a plastic bag.  ¨ Place a towel between your skin and the bag.  ¨ Leave the ice on for no longer than 15-20 minutes each time.  SEEK  IMMEDIATE MEDICAL CARE IF:   · Pain and swelling get worse rather than better or extend beyond the injection site.  · Numbness does not go away.  · Blood or fluid continues to leak from the injection site.  · You have chest pain.  · You have swelling of your face or tongue.  · You have trouble breathing or you become dizzy.  · You develop a fever, chills, or severe tenderness at the injection site that last longer than 1 day.  MAKE SURE YOU:  · Understand these instructions.  · Watch your condition.  · Get help right away if you are not doing well or if you get worse.  Document Released: 01/25/2011 Document Revised: 08/06/2011 Document Reviewed: 01/25/2011  ExitCare® Patient Information ©2015 [employer]. This information is not intended to replace advice given to you by your health care provider. Make sure you discuss any questions you have with your health care provider.

## 2014-09-24 ENCOUNTER — Other Ambulatory Visit: Payer: Self-pay | Admitting: Family

## 2014-10-25 ENCOUNTER — Other Ambulatory Visit: Payer: Self-pay | Admitting: Family

## 2014-11-24 ENCOUNTER — Other Ambulatory Visit: Payer: Self-pay | Admitting: Family Medicine

## 2014-11-24 ENCOUNTER — Other Ambulatory Visit: Payer: Self-pay | Admitting: Family

## 2014-12-01 ENCOUNTER — Ambulatory Visit (INDEPENDENT_AMBULATORY_CARE_PROVIDER_SITE_OTHER): Payer: Medicaid Other | Admitting: Family

## 2014-12-01 ENCOUNTER — Encounter: Payer: Self-pay | Admitting: Family

## 2014-12-01 VITALS — BP 113/56 | HR 68 | Temp 98.6°F | Ht 63.0 in | Wt 209.2 lb

## 2014-12-01 DIAGNOSIS — M47817 Spondylosis without myelopathy or radiculopathy, lumbosacral region: Secondary | ICD-10-CM | POA: Diagnosis not present

## 2014-12-01 DIAGNOSIS — E8881 Metabolic syndrome: Secondary | ICD-10-CM

## 2014-12-01 DIAGNOSIS — E559 Vitamin D deficiency, unspecified: Secondary | ICD-10-CM

## 2014-12-01 DIAGNOSIS — E1165 Type 2 diabetes mellitus with hyperglycemia: Secondary | ICD-10-CM | POA: Diagnosis not present

## 2014-12-01 DIAGNOSIS — E785 Hyperlipidemia, unspecified: Secondary | ICD-10-CM

## 2014-12-01 DIAGNOSIS — M5412 Radiculopathy, cervical region: Secondary | ICD-10-CM

## 2014-12-01 DIAGNOSIS — I10 Essential (primary) hypertension: Secondary | ICD-10-CM | POA: Diagnosis not present

## 2014-12-01 LAB — POCT GLYCOSYLATED HEMOGLOBIN (HGB A1C): Hemoglobin A1C: 6.2

## 2014-12-01 LAB — POCT UA - MICROALBUMIN: Microalbumin Ur, POC: NEGATIVE mg/L

## 2014-12-01 NOTE — Patient Instructions (Signed)

## 2014-12-01 NOTE — Progress Notes (Signed)
Subjective:    Patient ID: Holly Price, female    DOB: 05/28/1951, 64 y.o.   MRN: [id number]  Diabetes She presents for her follow-up diabetic visit. She has type 2 diabetes mellitus. Her disease course has been stable. There are no hypoglycemic associated symptoms. Pertinent negatives for hypoglycemia include no confusion, headaches or sweats. Associated symptoms include foot paresthesias. Pertinent negatives for diabetes include no blurred vision, no foot ulcerations and no visual change. There are no hypoglycemic complications. There are no diabetic complications. Current diabetic treatment includes oral agent (monotherapy) and diet (Victoza). She is compliant with treatment all of the time. She is following a diabetic diet. She participates in exercise daily. Her breakfast blood glucose range is generally 110-130 mg/dl. An ACE inhibitor/angiotensin II receptor blocker is being taken. Eye exam is current.  Hypertension This is a new problem. The current episode started more than 1 year ago. The problem has been resolved since onset. The problem is controlled. Pertinent negatives include no blurred vision, headaches, orthopnea, palpitations, peripheral edema, shortness of breath or sweats. Risk factors for coronary artery disease include post-menopausal state and obesity. Past treatments include ACE inhibitors, beta blockers and diuretics. The current treatment provides significant improvement. Compliance problems include exercise.  There is no history of kidney disease, CAD/MI or a thyroid problem. There is no history of sleep apnea.  Hyperlipidemia This is a new problem. The current episode started more than 1 year ago. The problem is uncontrolled. Recent lipid tests were reviewed and are high. Exacerbating diseases include diabetes. She has no history of hypothyroidism. Factors aggravating her hyperlipidemia include fatty foods. Associated symptoms include myalgias. Pertinent negatives include  no leg pain or shortness of breath. Current antihyperlipidemic treatment includes statins. The current treatment provides mild improvement of lipids. Compliance problems include adherence to exercise.  Risk factors for coronary artery disease include diabetes mellitus, dyslipidemia, family history, hypertension and post-menopausal.      Review of Systems  Constitutional: Negative.   HENT: Negative.   Eyes: Negative.  Negative for blurred vision.  Respiratory: Negative.  Negative for shortness of breath.   Cardiovascular: Negative.  Negative for palpitations and orthopnea.  Gastrointestinal: Negative.   Endocrine: Negative.   Genitourinary: Negative.   Musculoskeletal: Positive for myalgias.  Neurological: Negative.  Negative for headaches.  Hematological: Negative.   Psychiatric/Behavioral: Negative.  Negative for confusion.  All other systems reviewed and are negative.      Objective:   Physical Exam  Constitutional: She is oriented to person, place, and time. She appears well-developed and well-nourished. No distress.  HENT:  Head: Normocephalic and atraumatic.  Right Ear: External ear normal.  Left Ear: External ear normal.  Nose: Nose normal.  Mouth/Throat: Oropharynx is clear and moist.  Eyes: Pupils are equal, round, and reactive to light.  Neck: Normal range of motion. Neck supple. No thyromegaly present.  Cardiovascular: Normal rate, regular rhythm, normal heart sounds and intact distal pulses.   No murmur heard. Pulmonary/Chest: Effort normal and breath sounds normal. No respiratory distress. She has no wheezes.  Abdominal: Soft. Bowel sounds are normal. She exhibits no distension. There is no tenderness.  Musculoskeletal: Normal range of motion. She exhibits no edema or tenderness.  Neurological: She is alert and oriented to person, place, and time. She has normal reflexes. No cranial nerve deficit.  Skin: Skin is warm and dry.  Psychiatric: She has a normal mood and  affect. Her behavior is normal. Judgment and thought content normal.  Vitals  reviewed.    BP 113/56 mmHg  Pulse 68  Temp(Src) 98.6 F (37 C) (Oral)  Ht _0  (1.6 m)  Wt 209 lb 3.2 oz (94.892 kg)  BMI 37.07 kg/m2      Assessment & Plan:  1. Essential hypertension - CMP14+EGFR  2. Cervical radiculitis - CMP14+EGFR  3. Lumbosacral spondylosis without myelopathy - CMP14+EGFR  4. Hyperlipidemia - CMP14+EGFR - Lipid panel  5. Metabolic syndrome - PBD57+IXBO  6. Vitamin D deficiency - CMP14+EGFR - Vit D  25 hydroxy (rtn osteoporosis monitoring)  7. Type 2 diabetes mellitus with hyperglycemia - POCT glycosylated hemoglobin (Hb A1C) - CMP14+EGFR - POCT UA - Microalbumin   Continue all meds Labs pending Health Maintenance reviewed-Pt refuses all Health Maintenance Diet and exercise encouraged RTO 3 months  Evelina Dun, FNP

## 2014-12-02 ENCOUNTER — Telehealth: Payer: Self-pay | Admitting: *Deleted

## 2014-12-02 LAB — CMP14+EGFR
A/G RATIO: 1.8 (ref 1.1–2.5)
ALK PHOS: 44 IU/L (ref 39–117)
ALT: 12 IU/L (ref 0–32)
AST: 12 IU/L (ref 0–40)
Albumin: 4.4 g/dL (ref 3.6–4.8)
BILIRUBIN TOTAL: 0.2 mg/dL (ref 0.0–1.2)
BUN / CREAT RATIO: 29 — AB (ref 11–26)
BUN: 22 mg/dL (ref 8–27)
CO2: 25 mmol/L (ref 18–29)
Calcium: 9.1 mg/dL (ref 8.7–10.3)
Chloride: 102 mmol/L (ref 97–108)
Creatinine, Ser: 0.76 mg/dL (ref 0.57–1.00)
GFR calc Af Amer: 98 mL/min/{1.73_m2} (ref >59)
GFR calc non Af Amer: 85 mL/min/{1.73_m2} (ref >59)
GLOBULIN, TOTAL: 2.4 g/dL (ref 1.5–4.5)
Glucose: 88 mg/dL (ref 65–99)
POTASSIUM: 3.9 mmol/L (ref 3.5–5.2)
Sodium: 145 mmol/L — ABNORMAL HIGH (ref 134–144)
Total Protein: 6.8 g/dL (ref 6.0–8.5)

## 2014-12-02 LAB — LIPID PANEL
CHOLESTEROL TOTAL: 151 mg/dL (ref 100–199)
Chol/HDL Ratio: 3 ratio units (ref 0.0–4.4)
HDL: 50 mg/dL (ref >39)
LDL Calculated: 65 mg/dL (ref 0–99)
TRIGLYCERIDES: 182 mg/dL — AB (ref 0–149)
VLDL CHOLESTEROL CAL: 36 mg/dL (ref 5–40)

## 2014-12-02 LAB — VITAMIN D 25 HYDROXY (VIT D DEFICIENCY, FRACTURES): Vit D, 25-Hydroxy: 42.6 ng/mL (ref 30.0–100.0)

## 2014-12-02 NOTE — Telephone Encounter (Signed)
Pt notified of results Verbalizes understanding 

## 2014-12-02 NOTE — Telephone Encounter (Signed)
-----   Message from Sharion Balloon, [place] sent at 12/02/2014  2:24 PM EDT ----- HgbA1C WNL Microalbumin negative Kidney and liver function stable LDL levels WNL- Triglycerides elevated- PT needs to be on low fat diet Vit D levels WNL

## 2014-12-02 NOTE — Telephone Encounter (Signed)
-----   Message from Sharion Balloon, [place] sent at 12/02/2014  2:24 PM EDT ----- HgbA1C WNL Microalbumin negative Kidney and liver function stable LDL levels WNL- Triglycerides elevated- PT needs to be on low fat diet Vit D levels WNL

## 2014-12-23 ENCOUNTER — Ambulatory Visit (HOSPITAL_BASED_OUTPATIENT_CLINIC_OR_DEPARTMENT_OTHER): Payer: Medicaid Other | Admitting: Physical Medicine & Rehabilitation

## 2014-12-23 ENCOUNTER — Encounter: Payer: Medicaid Other | Attending: Physical Medicine & Rehabilitation

## 2014-12-23 ENCOUNTER — Encounter: Payer: Self-pay | Admitting: Physical Medicine & Rehabilitation

## 2014-12-23 VITALS — BP 129/60 | HR 65 | Resp 14

## 2014-12-23 DIAGNOSIS — M19011 Primary osteoarthritis, right shoulder: Secondary | ICD-10-CM | POA: Diagnosis not present

## 2014-12-23 DIAGNOSIS — M25511 Pain in right shoulder: Secondary | ICD-10-CM | POA: Diagnosis present

## 2014-12-23 NOTE — Progress Notes (Signed)
Shoulder injection R AC joint With  ultrasound guidance  Indication:Right Shoulder pain not relieved by medication management and other conservative care. Straight demonstrating DJD right a.c. Joint  Informed consent was obtained after describing risks and benefits of the procedure with the patient, this includes bleeding, bruising, infection and medication side effects. The patient wishes to proceed and has given written consent. Patient was placed in a seated position. The Right shoulder was marked and prepped with betadine in the subacromial area. A 25-gauge 1-1/2 inch needle was inserted into the Right AC joint using long axis views. After negative draw back for blood, a solution containing .5 mL of 6 mg per ML betamethasone and 1 mL of 1% lidocaine was injected. A band aid was applied. The patient tolerated the procedure well. Post procedure instructions were given.

## 2014-12-23 NOTE — Patient Instructions (Signed)
Acromioclavicular Injuries °The AC (acromioclavicular) joint is the joint in the shoulder where the collarbone (clavicle) meets the shoulder blade (scapula). The part of the shoulder blade connected to the collarbone is called the acromion. Common problems with and treatments for the AC joint are detailed below. °ARTHRITIS °Arthritis occurs when the joint has been injured and the smooth padding between the joints (cartilage) is lost. This is the wear and tear seen in most joints of the body if they have been overused. This causes the joint to produce pain and swelling which is worse with activity.  °AC JOINT SEPARATION °AC joint separation means that the ligaments connecting the acromion of the shoulder blade and collarbone have been damaged, and the two bones no longer line up. AC separations can be anywhere from mild to severe, and are "graded" depending upon which ligaments are torn and how badly they are torn. °· Grade I Injury: the least damage is done, and the AC joint still lines up. °· Grade II Injury: damage to the ligaments which reinforce the AC joint. In a Grade II injury, these ligaments are stretched but not entirely torn. When stressed, the AC joint becomes painful and unstable. °· Grade III Injury: AC and secondary ligaments are completely torn, and the collarbone is no longer attached to the shoulder blade. This results in deformity; a prominence of the end of the clavicle. °AC JOINT FRACTURE °AC joint fracture means that there has been a break in the bones of the AC joint, usually the end of the clavicle. °TREATMENT °TREATMENT OF AC ARTHRITIS °· There is currently no way to replace the cartilage damaged by arthritis. The best way to improve the condition is to decrease the activities which aggravate the problem. Application of ice to the joint helps decrease pain and soreness (inflammation). The use of non-steroidal anti-inflammatory medication is helpful. °· If less conservative measures do not  work, then cortisone shots (injections) may be used. These are anti-inflammatories; they decrease the soreness in the joint and swelling. °· If non-surgical measures fail, surgery may be recommended. The procedure is generally removal of a portion of the end of the clavicle. This is the part of the collarbone closest to your acromion which is stabilized with ligaments to the acromion of the shoulder blade. This surgery may be performed using a tube-like instrument with a light (arthroscope) for looking into a joint. It may also be performed as an open surgery through a small incision by the surgeon. Most patients will have good range of motion within 6 weeks and may return to all activity including sports by 8-12 weeks, barring complications. °TREATMENT OF AN AC SEPARATION °· The initial treatment is to decrease pain. This is best accomplished by immobilizing the arm in a sling and placing an ice pack to the shoulder for 20 to 30 minutes every 2 hours as needed. As the pain starts to subside, it is important to begin moving the fingers, wrist, elbow and eventually the shoulder in order to prevent a stiff or "frozen" shoulder. Instruction on when and how much to move the shoulder will be provided by your caregiver. The length of time needed to regain full motion and function depends on the amount or grade of the injury. Recovery from a Grade I AC separation usually takes 10 to 14 days, whereas a Grade III may take 6 to 8 weeks. °· Grade I and II separations usually do not require surgery. Even Grade III injuries usually allow return to full   activity with few restrictions. Treatment is also based on the activity demands of the injured shoulder. For example, a high level quarterback with an injured throwing arm will receive more aggressive treatment than someone with a desk job who rarely uses his/her arm for strenuous activities. In some cases, a painful lump may persist which could require a later surgery. Surgery  can be very successful, but the benefits must be weighed against the potential risks. °TREATMENT OF AN AC JOINT FRACTURE °Fracture treatment depends on the type of fracture. Sometimes a splint or sling may be all that is required. Other times surgery may be required for repair. This is more frequently the case when the ligaments supporting the clavicle are completely torn. Your caregiver will help you with these decisions and together you can decide what will be the best treatment. °HOME CARE INSTRUCTIONS  °· Apply ice to the injury for 15-20 minutes each hour while awake for 2 days. Put the ice in a plastic bag and place a towel between the bag of ice and skin. °· If a sling has been applied, wear it constantly for as long as directed by your caregiver, even at night. The sling or splint can be removed for bathing or showering or as directed. Be sure to keep the shoulder in the same place as when the sling is on. Do not lift the arm. °· If a figure-of-eight splint has been applied it should be tightened gently by another person every day. Tighten it enough to keep the shoulders held back. Allow enough room to place the index finger between the body and strap. Loosen the splint immediately if there is numbness or tingling in the hands. °· Take over-the-counter or prescription medicines for pain, discomfort or fever as directed by your caregiver. °· If you or your child has received a follow up appointment, it is very important to keep that appointment in order to avoid long term complications, chronic pain or disability. °SEEK MEDICAL CARE IF:  °· The pain is not relieved with medications. °· There is increased swelling or discoloration that continues to get worse rather than better. °· You or your child has been unable to follow up as instructed. °· There is progressive numbness and tingling in the arm, forearm or hand. °SEEK IMMEDIATE MEDICAL CARE IF:  °· The arm is numb, cold or pale. °· There is increasing pain  in the hand, forearm or fingers. °MAKE SURE YOU:  °· Understand these instructions. °· Will watch your condition. °· Will get help right away if you are not doing well or get worse. °Document Released: 02/21/2005 Document Revised: 08/06/2011 Document Reviewed: 08/16/2008 °[employer]® Patient Information ©2015 [employer]. This information is not intended to replace advice given to you by your health care provider. Make sure you discuss any questions you have with your health care provider. ° °

## 2014-12-24 ENCOUNTER — Other Ambulatory Visit: Payer: Self-pay | Admitting: Nurse Practitioner

## 2014-12-24 ENCOUNTER — Other Ambulatory Visit: Payer: Self-pay | Admitting: Family

## 2014-12-28 ENCOUNTER — Other Ambulatory Visit: Payer: Self-pay | Admitting: Family

## 2014-12-28 ENCOUNTER — Telehealth: Payer: Self-pay

## 2014-12-28 ENCOUNTER — Other Ambulatory Visit: Payer: Self-pay | Admitting: Registered Nurse

## 2014-12-28 NOTE — Telephone Encounter (Signed)
Victoza Pen  Non preferred with Medicaid  Preferred meds are: Byetta Pen, Bydureon Pen, or Tanzeum Pen

## 2014-12-29 ENCOUNTER — Telehealth: Payer: Self-pay

## 2014-12-29 NOTE — Telephone Encounter (Signed)
I have to ask if you would like to refill this medication, voltaren gel.  It was last ordered a year ago by you and has not been mentioned in any recent encounter, therefore I must pass this to you.

## 2014-12-29 NOTE — Telephone Encounter (Signed)
Victoza Pen was approved through [facility]

## 2015-01-26 ENCOUNTER — Other Ambulatory Visit: Payer: Self-pay | Admitting: Family

## 2015-02-26 ENCOUNTER — Other Ambulatory Visit: Payer: Self-pay | Admitting: Family

## 2015-02-28 ENCOUNTER — Other Ambulatory Visit: Payer: Self-pay | Admitting: Family

## 2015-03-01 ENCOUNTER — Telehealth: Payer: Self-pay

## 2015-03-01 MED ORDER — LOVASTATIN 20 MG PO TABS: 20.0000 mg | ORAL_TABLET | Freq: Every day | ORAL | Status: DC

## 2015-03-01 NOTE — Telephone Encounter (Signed)
Medicaid denied Livalo. Lovastatin Prescription sent to pharmacy

## 2015-03-01 NOTE — Telephone Encounter (Signed)
Medicaid non preferred Livalo  Preferred are atorvastatin, lovastatin, pravastatin or simvastatin

## 2015-03-15 ENCOUNTER — Telehealth: Payer: Self-pay

## 2015-03-15 NOTE — Telephone Encounter (Signed)
Medicaid authorized Livalo for one year

## 2015-03-24 ENCOUNTER — Ambulatory Visit (HOSPITAL_BASED_OUTPATIENT_CLINIC_OR_DEPARTMENT_OTHER): Payer: Medicaid Other | Admitting: Physical Medicine & Rehabilitation

## 2015-03-24 ENCOUNTER — Encounter: Payer: Self-pay | Admitting: Physical Medicine & Rehabilitation

## 2015-03-24 ENCOUNTER — Encounter: Payer: Medicaid Other | Attending: Physical Medicine & Rehabilitation

## 2015-03-24 VITALS — BP 148/69 | HR 63 | Resp 14

## 2015-03-24 DIAGNOSIS — M25511 Pain in right shoulder: Secondary | ICD-10-CM | POA: Diagnosis not present

## 2015-03-24 DIAGNOSIS — M19011 Primary osteoarthritis, right shoulder: Secondary | ICD-10-CM | POA: Diagnosis not present

## 2015-03-24 NOTE — Progress Notes (Signed)
Shoulder injection R AC joint With  ultrasound guidance  Indication:Right Shoulder pain not relieved by medication management and other conservative care. Straight demonstrating DJD right a.c. Joint  Informed consent was obtained after describing risks and benefits of the procedure with the patient, this includes bleeding, bruising, infection and medication side effects. The patient wishes to proceed and has given written consent. Patient was placed in a seated position. The Right shoulder was marked and prepped with betadine in the subacromial area. A 25-gauge 1-1/2 inch needle was inserted into the Right AC joint using long axis views. After negative draw back for blood, a solution containing .5 mL of 6 mg per ML betamethasone and 1 mL of 1% lidocaine was injected. A band aid was applied. The patient tolerated the procedure well. Post procedure instructions were given. 

## 2015-03-24 NOTE — Patient Instructions (Signed)
This was a acromial clavicular joint injection under ultrasound guidance to help relieve pain from arthritis.  Will repeat in 3 months   Please try heat up to 30 minutes every 2 hours for your low back pain do not sleep with a heating pad on

## 2015-03-29 ENCOUNTER — Other Ambulatory Visit: Payer: Self-pay | Admitting: Family

## 2015-04-13 ENCOUNTER — Telehealth: Payer: Self-pay | Admitting: Physical Medicine & Rehabilitation

## 2015-04-13 NOTE — Telephone Encounter (Signed)
Patient advised that she is coming in 11/28 for a shoulder injection.  She rescheduled due to a dental appointment.

## 2015-04-13 NOTE — Telephone Encounter (Signed)
Patient needs to know if she is supposed to come back on 11/28 for another shoulder injection.  She states she only gets them every few months.  Please call patient.

## 2015-04-15 ENCOUNTER — Ambulatory Visit (INDEPENDENT_AMBULATORY_CARE_PROVIDER_SITE_OTHER): Payer: Medicaid Other

## 2015-04-15 DIAGNOSIS — Z23 Encounter for immunization: Secondary | ICD-10-CM

## 2015-04-18 NOTE — Telephone Encounter (Signed)
Okay to fill? 

## 2015-04-25 ENCOUNTER — Ambulatory Visit: Payer: Medicaid Other | Admitting: Physical Medicine & Rehabilitation

## 2015-04-25 ENCOUNTER — Ambulatory Visit: Payer: Medicaid Other

## 2015-04-28 ENCOUNTER — Other Ambulatory Visit: Payer: Self-pay | Admitting: Family

## 2015-04-29 ENCOUNTER — Ambulatory Visit: Payer: Medicaid Other | Admitting: Physical Medicine & Rehabilitation

## 2015-04-29 ENCOUNTER — Other Ambulatory Visit: Payer: Self-pay | Admitting: Family

## 2015-04-29 ENCOUNTER — Encounter: Payer: Self-pay | Admitting: Physical Medicine & Rehabilitation

## 2015-04-29 VITALS — BP 161/82 | HR 62

## 2015-04-29 NOTE — Progress Notes (Signed)
Patient just had right AC joint injection under all shown guidance approximate one month ago. As discussed with patient too early to repeat. We will schedule for 2 more months from now

## 2015-05-29 ENCOUNTER — Other Ambulatory Visit: Payer: Self-pay | Admitting: Family

## 2015-05-29 DIAGNOSIS — I82409 Acute embolism and thrombosis of unspecified deep veins of unspecified lower extremity: Secondary | ICD-10-CM

## 2015-05-29 HISTORY — DX: Acute embolism and thrombosis of unspecified deep veins of unspecified lower extremity: I82.409

## 2015-06-24 ENCOUNTER — Ambulatory Visit (HOSPITAL_BASED_OUTPATIENT_CLINIC_OR_DEPARTMENT_OTHER): Payer: Medicaid Other | Admitting: Physical Medicine & Rehabilitation

## 2015-06-24 ENCOUNTER — Encounter: Payer: Self-pay | Admitting: Physical Medicine & Rehabilitation

## 2015-06-24 ENCOUNTER — Encounter: Payer: Medicaid Other | Attending: Physical Medicine & Rehabilitation

## 2015-06-24 VITALS — BP 144/91 | HR 62 | Resp 14

## 2015-06-24 DIAGNOSIS — M19011 Primary osteoarthritis, right shoulder: Secondary | ICD-10-CM | POA: Insufficient documentation

## 2015-06-24 MED ORDER — DICLOFENAC SODIUM 1 % TD GEL: TRANSDERMAL | Status: DC

## 2015-06-24 NOTE — Progress Notes (Signed)
Shoulder injection R AC joint With  ultrasound guidance  Indication:Right Shoulder pain not relieved by medication management and other conservative care. Straight demonstrating DJD right a.c. Joint  Informed consent was obtained after describing risks and benefits of the procedure with the patient, this includes bleeding, bruising, infection and medication side effects. The patient wishes to proceed and has given written consent. Patient was placed in a seated position. The Right shoulder was marked and prepped with betadine Acromioclavicular area. A 25-gauge 1-1/2 inch needle was inserted into the Right AC joint using long axis views. After negative draw back for blood, a solution containing .5 mL of 6 mg per ML betamethasone and 1 mL of 1% lidocaine was injected. A band aid was applied. The patient tolerated the procedure well. Post procedure instructions were given.

## 2015-06-24 NOTE — Patient Instructions (Signed)
You received a cortisone injection today, and may take a couple days to take full effect. We can repeat this in 3 months  I sent a prescription for Voltaren gel to your pharmacy

## 2015-06-28 ENCOUNTER — Ambulatory Visit (INDEPENDENT_AMBULATORY_CARE_PROVIDER_SITE_OTHER): Payer: Medicaid Other | Admitting: Family

## 2015-06-28 ENCOUNTER — Encounter: Payer: Self-pay | Admitting: Family

## 2015-06-28 VITALS — BP 148/61 | HR 61 | Temp 97.1°F | Ht 63.0 in | Wt 202.0 lb

## 2015-06-28 DIAGNOSIS — J0100 Acute maxillary sinusitis, unspecified: Secondary | ICD-10-CM

## 2015-06-28 MED ORDER — AMOXICILLIN-POT CLAVULANATE 875-125 MG PO TABS: 1.0000 | ORAL_TABLET | Freq: Two times a day (BID) | ORAL | Status: DC

## 2015-06-28 MED ORDER — FLUTICASONE PROPIONATE 50 MCG/ACT NA SUSP: 2.0000 | Freq: Every day | NASAL | Status: DC

## 2015-06-28 NOTE — Progress Notes (Signed)
Subjective:    Patient ID: Holly Price, female    DOB: [birth year], 65 y.o.   MRN: [id number]  Sinus Problem This is a new problem. The current episode started in the past 7 days. The problem has been waxing and waning since onset. There has been no fever. Her pain is at a severity of 7/10. The pain is moderate. Associated symptoms include congestion, coughing, headaches, a hoarse voice and sinus pressure. Pertinent negatives include no chills, ear pain, shortness of breath, sneezing or sore throat. Past treatments include acetaminophen. The treatment provided mild relief.      Review of Systems  Constitutional: Negative.  Negative for chills.  HENT: Positive for congestion, hoarse voice and sinus pressure. Negative for ear pain, sneezing and sore throat.   Eyes: Negative.   Respiratory: Positive for cough. Negative for shortness of breath.   Cardiovascular: Negative.  Negative for palpitations.  Gastrointestinal: Negative.   Endocrine: Negative.   Genitourinary: Negative.   Musculoskeletal: Negative.   Neurological: Positive for headaches.  Hematological: Negative.   Psychiatric/Behavioral: Negative.   All other systems reviewed and are negative.      Objective:   Physical Exam  Constitutional: She is oriented to person, place, and time. She appears well-developed and well-nourished. No distress.  HENT:  Head: Normocephalic and atraumatic.  Right Ear: External ear normal.  Left Ear: External ear normal.  Nose: Right sinus exhibits maxillary sinus tenderness and frontal sinus tenderness. Left sinus exhibits maxillary sinus tenderness and frontal sinus tenderness.  Nasal passage erythemas with mild swelling  Oropharynx erythemas  Eyes: Pupils are equal, round, and reactive to light.  Neck: Normal range of motion. Neck supple. No thyromegaly present.  Cardiovascular: Normal rate, regular rhythm, normal heart sounds and intact distal pulses.   No murmur  heard. Pulmonary/Chest: Effort normal and breath sounds normal. No respiratory distress. She has no wheezes.  Abdominal: Soft. Bowel sounds are normal. She exhibits no distension. There is no tenderness.  Musculoskeletal: Normal range of motion. She exhibits no edema or tenderness.  Neurological: She is alert and oriented to person, place, and time. She has normal reflexes. No cranial nerve deficit.  Skin: Skin is warm and dry.  Psychiatric: She has a normal mood and affect. Her behavior is normal. Judgment and thought content normal.  Vitals reviewed.    BP 148/61 mmHg  Pulse 61  Temp(Src) 97.1 F (36.2 C) (Oral)  Ht 5\' 3"  (1.6 m)  Wt 202 lb (91.627 kg)  BMI 35.79 kg/m2      Assessment & Plan:  1. Acute maxillary sinusitis, recurrence not specified -- Take meds as prescribed - Use a cool mist humidifier  -Use saline nose sprays frequently -Saline irrigations of the nose can be very helpful if done frequently.  * 4X daily for 1 week*  * Use of a nettie pot can be helpful with this. Follow directions with this* -Force fluids -For any cough or congestion  Use plain Mucinex- regular strength or max strength is fine   * Children- consult with Pharmacist for dosing -For fever or aces or pains- take tylenol or ibuprofen appropriate for age and weight.  * for fevers greater than 101 orally you may alternate ibuprofen and tylenol every  3 hours. -Throat lozenges if help - amoxicillin-clavulanate (AUGMENTIN) 875-125 MG tablet; Take 1 tablet by mouth 2 (two) times daily.  Dispense: 14 tablet; Refill: 0 - fluticasone (FLONASE) 50 MCG/ACT nasal spray; Place 2 sprays into both nostrils daily.  Dispense: 16 g; Refill: [place], FNP

## 2015-06-28 NOTE — Patient Instructions (Signed)
Sinusitis, Adult Sinusitis is redness, soreness, and inflammation of the paranasal sinuses. Paranasal sinuses are air pockets within the bones of your face. They are located beneath your eyes, in the middle of your forehead, and above your eyes. In healthy paranasal sinuses, mucus is able to drain out, and air is able to circulate through them by way of your nose. However, when your paranasal sinuses are inflamed, mucus and air can become trapped. This can allow bacteria and other germs to grow and cause infection. Sinusitis can develop quickly and last only a short time (acute) or continue over a long period (chronic). Sinusitis that lasts for more than 12 weeks is considered chronic. CAUSES Causes of sinusitis include:  Allergies.  Structural abnormalities, such as displacement of the cartilage that separates your nostrils (deviated septum), which can decrease the air flow through your nose and sinuses and affect sinus drainage.  Functional abnormalities, such as when the small hairs (cilia) that line your sinuses and help remove mucus do not work properly or are not present. SIGNS AND SYMPTOMS Symptoms of acute and chronic sinusitis are the same. The primary symptoms are pain and pressure around the affected sinuses. Other symptoms include:  Upper toothache.  Earache.  Headache.  Bad breath.  Decreased sense of smell and taste.  A cough, which worsens when you are lying flat.  Fatigue.  Fever.  Thick drainage from your nose, which often is green and may contain pus (purulent).  Swelling and warmth over the affected sinuses. DIAGNOSIS Your health care provider will perform a physical exam. During your exam, your health care provider may perform any of the following to help determine if you have acute sinusitis or chronic sinusitis:  Look in your nose for signs of abnormal growths in your nostrils (nasal polyps).  Tap over the affected sinus to check for signs of  infection.  View the inside of your sinuses using an imaging device that has a light attached (endoscope). If your health care provider suspects that you have chronic sinusitis, one or more of the following tests may be recommended:  Allergy tests.  Nasal culture. A sample of mucus is taken from your nose, sent to a lab, and screened for bacteria.  Nasal cytology. A sample of mucus is taken from your nose and examined by your health care provider to determine if your sinusitis is related to an allergy. TREATMENT Most cases of acute sinusitis are related to a viral infection and will resolve on their own within 10 days. Sometimes, medicines are prescribed to help relieve symptoms of both acute and chronic sinusitis. These may include pain medicines, decongestants, nasal steroid sprays, or saline sprays. However, for sinusitis related to a bacterial infection, your health care provider will prescribe antibiotic medicines. These are medicines that will help kill the bacteria causing the infection. Rarely, sinusitis is caused by a fungal infection. In these cases, your health care provider will prescribe antifungal medicine. For some cases of chronic sinusitis, surgery is needed. Generally, these are cases in which sinusitis recurs more than 3 times per year, despite other treatments. HOME CARE INSTRUCTIONS  Drink plenty of water. Water helps thin the mucus so your sinuses can drain more easily.  Use a humidifier.  Inhale steam 3-4 times a day (for example, sit in the bathroom with the shower running).  Apply a warm, moist washcloth to your face 3-4 times a day, or as directed by your health care provider.  Use saline nasal sprays to help   moisten and clean your sinuses.  Take medicines only as directed by your health care provider.  If you were prescribed either an antibiotic or antifungal medicine, finish it all even if you start to feel better. SEEK IMMEDIATE MEDICAL CARE IF:  You have  increasing pain or severe headaches.  You have nausea, vomiting, or drowsiness.  You have swelling around your face.  You have vision problems.  You have a stiff neck.  You have difficulty breathing.   This information is not intended to replace advice given to you by your health care provider. Make sure you discuss any questions you have with your health care provider.   Document Released: 05/14/2005 Document Revised: 06/04/2014 Document Reviewed: 05/29/2011 Elsevier Interactive Patient Education 2016 [employer]  - Take meds as prescribed - Use a cool mist humidifier  -Use saline nose sprays frequently -Saline irrigations of the nose can be very helpful if done frequently.  * 4X daily for 1 week*  * Use of a nettie pot can be helpful with this. Follow directions with this* -Force fluids -For any cough or congestion  Use plain Mucinex- regular strength or max strength is fine   * Children- consult with Pharmacist for dosing -For fever or aces or pains- take tylenol or ibuprofen appropriate for age and weight.  * for fevers greater than 101 orally you may alternate ibuprofen and tylenol every  3 hours. -Throat lozenges if help   Josefa [place], FNP   

## 2015-06-30 ENCOUNTER — Other Ambulatory Visit: Payer: Self-pay | Admitting: Family

## 2015-07-01 ENCOUNTER — Other Ambulatory Visit: Payer: Self-pay | Admitting: Family

## 2015-07-04 ENCOUNTER — Other Ambulatory Visit (INDEPENDENT_AMBULATORY_CARE_PROVIDER_SITE_OTHER): Payer: Medicaid Other

## 2015-07-04 DIAGNOSIS — E0800 Diabetes mellitus due to underlying condition with hyperosmolarity without nonketotic hyperglycemic-hyperosmolar coma (NKHHC): Secondary | ICD-10-CM | POA: Diagnosis not present

## 2015-07-04 DIAGNOSIS — E559 Vitamin D deficiency, unspecified: Secondary | ICD-10-CM

## 2015-07-04 DIAGNOSIS — I10 Essential (primary) hypertension: Secondary | ICD-10-CM

## 2015-07-04 DIAGNOSIS — E785 Hyperlipidemia, unspecified: Secondary | ICD-10-CM

## 2015-07-04 LAB — POCT GLYCOSYLATED HEMOGLOBIN (HGB A1C): Hemoglobin A1C: 6

## 2015-07-04 NOTE — Progress Notes (Signed)
Lab only 

## 2015-07-05 ENCOUNTER — Other Ambulatory Visit: Payer: Medicaid Other

## 2015-07-05 LAB — CBC WITH DIFFERENTIAL/PLATELET
BASOS: 0 %
Basophils Absolute: 0 10*3/uL (ref 0.0–0.2)
EOS (ABSOLUTE): 0.3 10*3/uL (ref 0.0–0.4)
EOS: 3 %
HEMATOCRIT: 43.3 % (ref 34.0–46.6)
Hemoglobin: 14.5 g/dL (ref 11.1–15.9)
IMMATURE GRANS (ABS): 0 10*3/uL (ref 0.0–0.1)
IMMATURE GRANULOCYTES: 0 %
LYMPHS: 29 %
Lymphocytes Absolute: 2.7 10*3/uL (ref 0.7–3.1)
MCH: 32 pg (ref 26.6–33.0)
MCHC: 33.5 g/dL (ref 31.5–35.7)
MCV: 96 fL (ref 79–97)
Monocytes Absolute: 0.9 10*3/uL (ref 0.1–0.9)
Monocytes: 9 %
NEUTROS PCT: 59 %
Neutrophils Absolute: 5.5 10*3/uL (ref 1.4–7.0)
PLATELETS: 243 10*3/uL (ref 150–379)
RBC: 4.53 x10E6/uL (ref 3.77–5.28)
RDW: 13.5 % (ref 12.3–15.4)
WBC: 9.3 10*3/uL (ref 3.4–10.8)

## 2015-07-05 LAB — LIPID PANEL
Chol/HDL Ratio: 2.9 ratio units (ref 0.0–4.4)
Cholesterol, Total: 134 mg/dL (ref 100–199)
HDL: 46 mg/dL (ref >39)
LDL Calculated: 68 mg/dL (ref 0–99)
Triglycerides: 101 mg/dL (ref 0–149)
VLDL Cholesterol Cal: 20 mg/dL (ref 5–40)

## 2015-07-05 LAB — HEPATIC FUNCTION PANEL
ALT: 15 IU/L (ref 0–32)
AST: 16 IU/L (ref 0–40)
Albumin: 3.9 g/dL (ref 3.6–4.8)
Alkaline Phosphatase: 39 IU/L (ref 39–117)
BILIRUBIN, DIRECT: 0.07 mg/dL (ref 0.00–0.40)
Total Protein: 5.9 g/dL — ABNORMAL LOW (ref 6.0–8.5)

## 2015-07-05 LAB — BMP8+EGFR
BUN/Creatinine Ratio: 24 (ref 11–26)
BUN: 24 mg/dL (ref 8–27)
CALCIUM: 9.2 mg/dL (ref 8.7–10.3)
CHLORIDE: 99 mmol/L (ref 96–106)
CO2: 26 mmol/L (ref 18–29)
Creatinine, Ser: 1.01 mg/dL — ABNORMAL HIGH (ref 0.57–1.00)
GFR, EST AFRICAN AMERICAN: 69 mL/min/{1.73_m2} (ref >59)
GFR, EST NON AFRICAN AMERICAN: 60 mL/min/{1.73_m2} (ref >59)
Glucose: 103 mg/dL — ABNORMAL HIGH (ref 65–99)
Potassium: 4.8 mmol/L (ref 3.5–5.2)
Sodium: 141 mmol/L (ref 134–144)

## 2015-07-05 LAB — VITAMIN D 25 HYDROXY (VIT D DEFICIENCY, FRACTURES): Vit D, 25-Hydroxy: 78.2 ng/mL (ref 30.0–100.0)

## 2015-07-07 ENCOUNTER — Ambulatory Visit (INDEPENDENT_AMBULATORY_CARE_PROVIDER_SITE_OTHER): Payer: Medicaid Other | Admitting: Family

## 2015-07-07 ENCOUNTER — Ambulatory Visit: Payer: Medicaid Other | Admitting: Family

## 2015-07-07 ENCOUNTER — Encounter: Payer: Self-pay | Admitting: Family

## 2015-07-07 VITALS — BP 114/71 | HR 60 | Temp 97.3°F | Ht 63.0 in | Wt 199.2 lb

## 2015-07-07 DIAGNOSIS — E785 Hyperlipidemia, unspecified: Secondary | ICD-10-CM | POA: Diagnosis not present

## 2015-07-07 DIAGNOSIS — I10 Essential (primary) hypertension: Secondary | ICD-10-CM | POA: Diagnosis not present

## 2015-07-07 DIAGNOSIS — Z1211 Encounter for screening for malignant neoplasm of colon: Secondary | ICD-10-CM | POA: Diagnosis not present

## 2015-07-07 DIAGNOSIS — E0800 Diabetes mellitus due to underlying condition with hyperosmolarity without nonketotic hyperglycemic-hyperosmolar coma (NKHHC): Secondary | ICD-10-CM | POA: Diagnosis not present

## 2015-07-07 DIAGNOSIS — E559 Vitamin D deficiency, unspecified: Secondary | ICD-10-CM

## 2015-07-07 DIAGNOSIS — J0101 Acute recurrent maxillary sinusitis: Secondary | ICD-10-CM | POA: Diagnosis not present

## 2015-07-07 DIAGNOSIS — E8881 Metabolic syndrome: Secondary | ICD-10-CM

## 2015-07-07 DIAGNOSIS — M25511 Pain in right shoulder: Secondary | ICD-10-CM | POA: Diagnosis not present

## 2015-07-07 MED ORDER — PREDNISONE 10 MG (21) PO TBPK: 10.0000 mg | ORAL_TABLET | Freq: Every day | ORAL | Status: DC

## 2015-07-07 MED ORDER — ASPIRIN EC 81 MG PO TBEC: 81.0000 mg | DELAYED_RELEASE_TABLET | Freq: Every day | ORAL | Status: DC

## 2015-07-07 NOTE — Patient Instructions (Signed)
Health Maintenance, Female Adopting a healthy lifestyle and getting preventive care can go a long way to promote health and wellness. Talk with your health care provider about what schedule of regular examinations is right for you. This is a good chance for you to check in with your provider about disease prevention and staying healthy. In between checkups, there are plenty of things you can do on your own. Experts have done a lot of research about which lifestyle changes and preventive measures are most likely to keep you healthy. Ask your health care provider for more information. WEIGHT AND DIET  Eat a healthy diet  Be sure to include plenty of vegetables, fruits, low-fat dairy products, and lean protein.  Do not eat a lot of foods high in solid fats, added sugars, or salt.  Get regular exercise. This is one of the most important things you can do for your health.  Most adults should exercise for at least 150 minutes each week. The exercise should increase your heart rate and make you sweat (moderate-intensity exercise).  Most adults should also do strengthening exercises at least twice a week. This is in addition to the moderate-intensity exercise.  Maintain a healthy weight  Body mass index (BMI) is a measurement that can be used to identify possible weight problems. It estimates body fat based on height and weight. Your health care provider can help determine your BMI and help you achieve or maintain a healthy weight.  For females 20 years of age and older:   A BMI below 18.5 is considered underweight.  A BMI of 18.5 to 24.9 is normal.  A BMI of 25 to 29.9 is considered overweight.  A BMI of 30 and above is considered obese.  Watch levels of cholesterol and blood lipids  You should start having your blood tested for lipids and cholesterol at 63 years of age, then have this test every 5 years.  You may need to have your cholesterol levels checked more often if:  Your lipid  or cholesterol levels are high.  You are older than 63 years of age.  You are at high risk for heart disease.  CANCER SCREENING   Lung Cancer  Lung cancer screening is recommended for adults 55-80 years old who are at high risk for lung cancer because of a history of smoking.  A yearly low-dose CT scan of the lungs is recommended for people who:  Currently smoke.  Have quit within the past 15 years.  Have at least a 30-pack-year history of smoking. A pack year is smoking an average of one pack of cigarettes a day for 1 year.  Yearly screening should continue until it has been 15 years since you quit.  Yearly screening should stop if you develop a health problem that would prevent you from having lung cancer treatment.  Breast Cancer  Practice breast self-awareness. This means understanding how your breasts normally appear and feel.  It also means doing regular breast self-exams. Let your health care provider know about any changes, no matter how small.  If you are in your 20s or 30s, you should have a clinical breast exam (CBE) by a health care provider every 1-3 years as part of a regular health exam.  If you are 40 or older, have a CBE every year. Also consider having a breast X-ray (mammogram) every year.  If you have a family history of breast cancer, talk to your health care provider about genetic screening.  If you   are at high risk for breast cancer, talk to your health care provider about having an MRI and a mammogram every year.  Breast cancer gene (BRCA) assessment is recommended for women who have family members with BRCA-related cancers. BRCA-related cancers include:  Breast.  Ovarian.  Tubal.  Peritoneal cancers.  Results of the assessment will determine the need for genetic counseling and BRCA1 and BRCA2 testing. Cervical Cancer Your health care provider may recommend that you be screened regularly for cancer of the pelvic organs (ovaries, uterus, and  vagina). This screening involves a pelvic examination, including checking for microscopic changes to the surface of your cervix (Pap test). You may be encouraged to have this screening done every 3 years, beginning at age 21.  For women ages 30-65, health care providers may recommend pelvic exams and Pap testing every 3 years, or they may recommend the Pap and pelvic exam, combined with testing for human papilloma virus (HPV), every 5 years. Some types of HPV increase your risk of cervical cancer. Testing for HPV may also be done on women of any age with unclear Pap test results.  Other health care providers may not recommend any screening for nonpregnant women who are considered low risk for pelvic cancer and who do not have symptoms. Ask your health care provider if a screening pelvic exam is right for you.  If you have had past treatment for cervical cancer or a condition that could lead to cancer, you need Pap tests and screening for cancer for at least 20 years after your treatment. If Pap tests have been discontinued, your risk factors (such as having a new sexual partner) need to be reassessed to determine if screening should resume. Some women have medical problems that increase the chance of getting cervical cancer. In these cases, your health care provider may recommend more frequent screening and Pap tests. Colorectal Cancer  This type of cancer can be detected and often prevented.  Routine colorectal cancer screening usually begins at 63 years of age and continues through 63 years of age.  Your health care provider may recommend screening at an earlier age if you have risk factors for colon cancer.  Your health care provider may also recommend using home test kits to check for hidden blood in the stool.  A small camera at the end of a tube can be used to examine your colon directly (sigmoidoscopy or colonoscopy). This is done to check for the earliest forms of colorectal  cancer.  Routine screening usually begins at age 50.  Direct examination of the colon should be repeated every 5-10 years through 63 years of age. However, you may need to be screened more often if early forms of precancerous polyps or small growths are found. Skin Cancer  Check your skin from head to toe regularly.  Tell your health care provider about any new moles or changes in moles, especially if there is a change in a mole's shape or color.  Also tell your health care provider if you have a mole that is larger than the size of a pencil eraser.  Always use sunscreen. Apply sunscreen liberally and repeatedly throughout the day.  Protect yourself by wearing long sleeves, pants, a wide-brimmed hat, and sunglasses whenever you are outside. HEART DISEASE, DIABETES, AND HIGH BLOOD PRESSURE   High blood pressure causes heart disease and increases the risk of stroke. High blood pressure is more likely to develop in:  People who have blood pressure in the high end   of the normal range (130-139/85-89 mm Hg).  People who are overweight or obese.  People who are African American.  If you are 38-23 years of age, have your blood pressure checked every 3-5 years. If you are 61 years of age or older, have your blood pressure checked every year. You should have your blood pressure measured twice--once when you are at a hospital or clinic, and once when you are not at a hospital or clinic. Record the average of the two measurements. To check your blood pressure when you are not at a hospital or clinic, you can use:  An automated blood pressure machine at a pharmacy.  A home blood pressure monitor.  If you are between 45 years and 39 years old, ask your health care provider if you should take aspirin to prevent strokes.  Have regular diabetes screenings. This involves taking a blood sample to check your fasting blood sugar level.  If you are at a normal weight and have a low risk for diabetes,  have this test once every three years after 63 years of age.  If you are overweight and have a high risk for diabetes, consider being tested at a younger age or more often. PREVENTING INFECTION  Hepatitis B  If you have a higher risk for hepatitis B, you should be screened for this virus. You are considered at high risk for hepatitis B if:  You were born in a country where hepatitis B is common. Ask your health care provider which countries are considered high risk.  Your parents were born in a high-risk country, and you have not been immunized against hepatitis B (hepatitis B vaccine).  You have HIV or AIDS.  You use needles to inject street drugs.  You live with someone who has hepatitis B.  You have had sex with someone who has hepatitis B.  You get hemodialysis treatment.  You take certain medicines for conditions, including cancer, organ transplantation, and autoimmune conditions. Hepatitis C  Blood testing is recommended for:  Everyone born from [birth year] through 1963.  Anyone with known risk factors for hepatitis C. Sexually transmitted infections (STIs)  You should be screened for sexually transmitted infections (STIs) including gonorrhea and chlamydia if:  You are sexually active and are younger than 63 years of age.  You are older than 63 years of age and your health care provider tells you that you are at risk for this type of infection.  Your sexual activity has changed since you were last screened and you are at an increased risk for chlamydia or gonorrhea. Ask your health care provider if you are at risk.  If you do not have HIV, but are at risk, it may be recommended that you take a prescription medicine daily to prevent HIV infection. This is called pre-exposure prophylaxis (PrEP). You are considered at risk if:  You are sexually active and do not regularly use condoms or know the HIV status of your partner(s).  You take drugs by injection.  You are sexually  active with a partner who has HIV. Talk with your health care provider about whether you are at high risk of being infected with HIV. If you choose to begin PrEP, you should first be tested for HIV. You should then be tested every 3 months for as long as you are taking PrEP.  PREGNANCY   If you are premenopausal and you may become pregnant, ask your health care provider about preconception counseling.  If you may  become pregnant, take 400 to 800 micrograms (mcg) of folic acid every day.  If you want to prevent pregnancy, talk to your health care provider about birth control (contraception). OSTEOPOROSIS AND MENOPAUSE   Osteoporosis is a disease in which the bones lose minerals and strength with aging. This can result in serious bone fractures. Your risk for osteoporosis can be identified using a bone density scan.  If you are 61 years of age or older, or if you are at risk for osteoporosis and fractures, ask your health care provider if you should be screened.  Ask your health care provider whether you should take a calcium or vitamin D supplement to lower your risk for osteoporosis.  Menopause may have certain physical symptoms and risks.  Hormone replacement therapy may reduce some of these symptoms and risks. Talk to your health care provider about whether hormone replacement therapy is right for you.  HOME CARE INSTRUCTIONS   Schedule regular health, dental, and eye exams.  Stay current with your immunizations.   Do not use any tobacco products including cigarettes, chewing tobacco, or electronic cigarettes.  If you are pregnant, do not drink alcohol.  If you are breastfeeding, limit how much and how often you drink alcohol.  Limit alcohol intake to no more than 1 drink per day for nonpregnant women. One drink equals 12 ounces of beer, 5 ounces of wine, or 1 ounces of hard liquor.  Do not use street drugs.  Do not share needles.  Ask your health care provider for help if  you need support or information about quitting drugs.  Tell your health care provider if you often feel depressed.  Tell your health care provider if you have ever been abused or do not feel safe at home.   This information is not intended to replace advice given to you by your health care provider. Make sure you discuss any questions you have with your health care provider.   Document Released: 11/27/2010 Document Revised: 06/04/2014 Document Reviewed: 04/15/2013 Elsevier Interactive Patient Education [employer].

## 2015-07-07 NOTE — Progress Notes (Signed)
Subjective:    Patient ID: Holly Price, female    DOB: 1950/11/23, 65 y.o.   MRN: [id number]  PT presents to the office today for recurrent sinus infection. PT was seen in the office on 06/28/15 and was given Augmentin and flonase. PT states she continues to have headache, ear pain, sinus pressure.  Sinus Problem This is a recurrent problem. The problem has been waxing and waning since onset. There has been no fever. Her pain is at a severity of 4/10. The pain is mild. Associated symptoms include congestion, coughing, ear pain, headaches and sinus pressure. Pertinent negatives include no hoarse voice, shortness of breath, sneezing or sore throat. Past treatments include antibiotics, lying down, saline sprays and spray decongestants. The treatment provided mild relief.  Hypertension This is a new problem. The current episode started more than 1 year ago. The problem has been resolved since onset. The problem is controlled. Associated symptoms include headaches. Pertinent negatives include no palpitations, peripheral edema, shortness of breath or sweats. Risk factors for coronary artery disease include post-menopausal state and obesity. Past treatments include ACE inhibitors, beta blockers and diuretics. The current treatment provides significant improvement. Compliance problems include exercise.  There is no history of kidney disease, CAD/MI or a thyroid problem. There is no history of sleep apnea.  Diabetes She presents for her follow-up diabetic visit. She has type 2 diabetes mellitus. Her disease course has been stable. Hypoglycemia symptoms include headaches. Pertinent negatives for hypoglycemia include no sweats. Associated symptoms include foot paresthesias. Pertinent negatives for diabetes include no foot ulcerations and no visual change. There are no hypoglycemic complications. There are no diabetic complications. Current diabetic treatment includes oral agent (monotherapy) and diet  (Victoza). She is compliant with treatment all of the time. She is following a diabetic diet. She participates in exercise daily. Her breakfast blood glucose range is generally 110-130 mg/dl. An ACE inhibitor/angiotensin II receptor blocker is being taken. Eye exam is current.  Hyperlipidemia This is a new problem. The current episode started more than 1 year ago. The problem is controlled. Recent lipid tests were reviewed and are normal. Exacerbating diseases include diabetes. She has no history of hypothyroidism. Factors aggravating her hyperlipidemia include fatty foods. Pertinent negatives include no leg pain or shortness of breath. Current antihyperlipidemic treatment includes statins. The current treatment provides mild improvement of lipids. Compliance problems include adherence to exercise.  Risk factors for coronary artery disease include diabetes mellitus, dyslipidemia, family history, hypertension and post-menopausal.      Review of Systems  Constitutional: Negative.   HENT: Positive for congestion, ear pain and sinus pressure. Negative for hoarse voice, sneezing and sore throat.   Eyes: Negative.   Respiratory: Positive for cough. Negative for shortness of breath.   Cardiovascular: Negative.  Negative for palpitations.  Gastrointestinal: Negative.   Endocrine: Negative.   Genitourinary: Negative.   Musculoskeletal: Negative.   Neurological: Positive for headaches.  Hematological: Negative.   Psychiatric/Behavioral: Negative.   All other systems reviewed and are negative.      Objective:   Physical Exam  Constitutional: She is oriented to person, place, and time. She appears well-developed and well-nourished. No distress.  HENT:  Head: Normocephalic and atraumatic.  Right Ear: External ear normal.  Left Ear: External ear normal.  Nose: Nose normal.  Mouth/Throat: Oropharynx is clear and moist.  Eyes: Pupils are equal, round, and reactive to light.  Neck: Normal range of  motion. Neck supple. No thyromegaly present.  Cardiovascular: Normal rate,  regular rhythm, normal heart sounds and intact distal pulses.   No murmur heard. Pulmonary/Chest: Effort normal. No respiratory distress. She has wheezes.  Abdominal: Soft. Bowel sounds are normal. She exhibits no distension. There is no tenderness.  Musculoskeletal: Normal range of motion. She exhibits no edema or tenderness.  Neurological: She is alert and oriented to person, place, and time. She has normal reflexes. No cranial nerve deficit.  Skin: Skin is warm and dry.  Psychiatric: She has a normal mood and affect. Her behavior is normal. Judgment and thought content normal.  Vitals reviewed.     BP 114/71 mmHg  Pulse 60  Temp(Src) 97.3 F (36.3 C) (Oral)  Ht 5\' 3"  (1.6 m)  Wt 199 lb 3.2 oz (90.357 kg)  BMI 35.30 kg/m2     Assessment & Plan:  1. Essential hypertension  2. Diabetes mellitus due to underlying condition with hyperosmolarity without coma, without long-term current use of insulin (HCC - Microalbumin / creatinine urine ratio - Ambulatory referral to Ophthalmology  3. Hyperlipidemia  4. Metabolic syndrome  5. Vitamin D deficiency  6. Pain in joint of right shoulder  7. Colon cancer screening - Fecal occult blood, imunochemical; Future  8. Acute recurrent maxillary sinusitis - predniSONE (STERAPRED UNI-PAK 21 TAB) 10 MG (21) TBPK tablet; Take 1 tablet (10 mg total) by mouth daily. Use as directed  Dispense: 21 tablet; Refill: 0   Continue all meds Labs discussed Health Maintenance reviewed- PT to schedule mammogram Diet and exercise encouraged RTO 6 months  Evelina Dun, FNP

## 2015-07-08 LAB — MICROALBUMIN / CREATININE URINE RATIO
Creatinine, Urine: 191.5 mg/dL
MICROALB/CREAT RATIO: 2.9 mg/g{creat} (ref 0.0–30.0)
Microalbumin, Urine: 5.6 ug/mL

## 2015-07-27 ENCOUNTER — Other Ambulatory Visit: Payer: Self-pay | Admitting: Family

## 2015-08-05 ENCOUNTER — Other Ambulatory Visit: Payer: Self-pay | Admitting: *Deleted

## 2015-08-05 MED ORDER — DICLOFENAC SODIUM 1 % TD GEL: 2.0000 g | Freq: Four times a day (QID) | TRANSDERMAL | Status: DC

## 2015-08-10 ENCOUNTER — Other Ambulatory Visit: Payer: Self-pay | Admitting: Family

## 2015-08-10 ENCOUNTER — Ambulatory Visit (INDEPENDENT_AMBULATORY_CARE_PROVIDER_SITE_OTHER): Payer: Medicaid Other | Admitting: Family

## 2015-08-10 ENCOUNTER — Encounter: Payer: Self-pay | Admitting: Family

## 2015-08-10 ENCOUNTER — Other Ambulatory Visit: Payer: Self-pay | Admitting: *Deleted

## 2015-08-10 ENCOUNTER — Ambulatory Visit (HOSPITAL_COMMUNITY)
Admission: RE | Admit: 2015-08-10 | Discharge: 2015-08-10 | Disposition: A | Discharge status: Home / Self Care | Payer: Medicaid Other | Source: Ambulatory Visit | Attending: Family | Admitting: Family

## 2015-08-10 VITALS — BP 150/57 | HR 67 | Temp 97.1°F | Ht 63.0 in | Wt 203.0 lb

## 2015-08-10 DIAGNOSIS — M79662 Pain in left lower leg: Secondary | ICD-10-CM | POA: Diagnosis not present

## 2015-08-10 DIAGNOSIS — M79652 Pain in left thigh: Secondary | ICD-10-CM | POA: Diagnosis not present

## 2015-08-10 DIAGNOSIS — I82890 Acute embolism and thrombosis of other specified veins: Secondary | ICD-10-CM | POA: Insufficient documentation

## 2015-08-10 MED ORDER — RIVAROXABAN 15 MG PO TABS: 15.0000 mg | ORAL_TABLET | Freq: Two times a day (BID) | ORAL | Status: DC

## 2015-08-10 NOTE — Progress Notes (Signed)
   Subjective:    Patient ID: [place] ZOGG, female    DOB: 1950-10-12, 65 y.o.   MRN: [id number]  Pt presents to the office today with left calf pain that started about 5 days ago. Pt states the pain has now gone to her thigh. Pt denies any injury. Pt reports warmth in the area, but denies any redness. Pt states it was so painful she could not put "covers over it".  Leg Pain  The incident occurred 3 to 5 days ago. There was no injury mechanism. Pain location: left calf. The quality of the pain is described as aching. The pain is at a severity of 8/10. The pain is mild. The pain has been constant since onset. Pertinent negatives include no loss of motion, loss of sensation, muscle weakness, numbness or tingling. She reports no foreign bodies present. The symptoms are aggravated by movement. Treatments tried: voltaren gel. The treatment provided mild relief.      Review of Systems  Constitutional: Negative.   HENT: Negative.   Eyes: Negative.   Respiratory: Negative.  Negative for shortness of breath.   Cardiovascular: Negative.  Negative for palpitations.  Gastrointestinal: Negative.   Endocrine: Negative.   Genitourinary: Negative.   Musculoskeletal: Negative.   Neurological: Negative.  Negative for tingling, numbness and headaches.  Hematological: Negative.   Psychiatric/Behavioral: Negative.   All other systems reviewed and are negative.      Objective:   Physical Exam  Constitutional: She is oriented to person, place, and time. She appears well-developed and well-nourished. No distress.  HENT:  Head: Normocephalic and atraumatic.  Eyes: Pupils are equal, round, and reactive to light.  Neck: Normal range of motion. Neck supple. No thyromegaly present.  Cardiovascular: Normal rate, regular rhythm, normal heart sounds and intact distal pulses.   No murmur heard. Pulmonary/Chest: Effort normal and breath sounds normal. No respiratory distress. She has no wheezes.  Abdominal:  Soft. Bowel sounds are normal. She exhibits no distension. There is no tenderness.  Musculoskeletal: Normal range of motion. She exhibits tenderness. She exhibits no edema.  Positive homan's sign   Neurological: She is alert and oriented to person, place, and time.  Skin: Skin is warm and dry.  Psychiatric: She has a normal mood and affect. Her behavior is normal. Judgment and thought content normal.  Vitals reviewed.    BP 150/57 mmHg  Pulse 67  Temp(Src) 97.1 F (36.2 C) (Oral)  Ht 5\' 3"  (1.6 m)  Wt 203 lb (92.08 kg)  BMI 35.97 kg/m2      Assessment & Plan:  1. Calf pain, left - CBC with Differential/Platelet - Arthritis Panel - US Venous Img Lower Unilateral Left; Future  2. Left thigh pain - CBC with Differential/Platelet - Arthritis Panel - US Venous Img Lower Unilateral Left; Future  Doppler pending to rule out DVT If negative will treat as muscle strain RTO prn   Evelina Dun, FNP

## 2015-08-10 NOTE — Patient Instructions (Signed)
Venous Thromboembolism Prevention Venous thromboembolism (VTE) is a condition in which a blood clot (thrombus) develops in the body. A thrombus usually occurs in a deep vein in the leg or the pelvis (DVT), but it can also occur in the arm. Sometimes, pieces of a thrombus can break off from its original place of development and travel through the bloodstream to other parts of the body. When that happens, the thrombus is called an embolus. An embolus that travels to one or both lungs is called a pulmonary embolism. An embolism can block the blood flow in the blood vessels of other organs as well. VTE is a serious health condition that can cause disability or death. It is very important to get help right away and to not ignore symptoms. HOW CAN A VTE BE PREVENTED?  Exercise regularly. Take a brisk 30 minute walk every day. Staying active and moving around can help you to prevent blood clots.  Avoid sitting or lying in bed for long periods of time without moving your legs. Change your position often, especially during long-distance travel (over 4 hours).  If you are a woman who is over 53 years of age, avoid unnecessary use of medicines that contain estrogen. These include birth control pills and hormone replacement therapy.  Do not smoke, especially if you take estrogen medicines. If you need help quitting, ask your health care provider.  Eat plenty of fruits and vegetables. Ask your health care provider or dietitian if there are foods that you should avoid.  Maintain a weight that is appropriate for your height. Ask your health care provider what weight is healthy for you.  Wear loose-fitting clothing. Avoid constrictive or tight clothing around your legs or waist.  Try not to bump or injure your legs. Avoid crossing your legs when you are sitting.  Do not use pillows under your knees while lying down unless told by your health care provider.  Wear support hose (compression stockings or TED  hose) as told by your health care provider Compression stockings increase blood flow in your legs and can help prevent blood clots. Do not let them bunch up when you are wearing them. HOW CAN I PREVENT VTE WHEN I TRAVEL? Long-distance travel (over 4 hours) can increase the risk of a VTE. To prevent VTE when traveling:  Exercise your legs every hour by standing, stretching, and bending and straightening your legs. If you are traveling by airplane, train, or bus, walk up and down the aisle as often as possible to get your blood moving. If you are traveling by car, stop and get out of the car every hour to exercise your legs and stretch. Other types of exercise might include:  Keeping your feet flat on the ground and raising your toes.  Switching from tightening the muscles in your calves and thighs to relaxing those same muscles while you are sitting.  Pointing and flexing your feet at the ankle joints while you are sitting.  Stay well hydrated while traveling. Drink enough water to keep your urine clear or pale yellow.  Avoid drinking alcohol during long travel. Generally, it is not recommended that you take medicines to prevent DVT during routine travel. HOW CAN VTE BE PREVENTED IF I AM HOSPITALIZED? A VTE may be prevented by taking medicines that are prescribed to prevent blood clots (anticoagulants). You can also help to prevent VTE while in the hospital by taking these actions:  Get out of bed and walk. Ask your health care provider  if this is safe for you to do.  Request the use of a sequential compression device (SCD). This is a machine that pumps air into compression sleeves that are wrapped around your legs.  Request the use of compression stockings, which are tight, elastic stockings that apply pressure to the lower legs. Compression stockings are sometimes used with SCDs. HOW CAN I PREVENT VTE AFTER SURGERY? Understand that there is an increased risk for VTE for the first 4-6 weeks  after surgery. During this time:  Avoid long-distance travel (over 4 hours). If you must travel during this time, ask your health care provider about additional preventive actions that you can take. These might include exercising your arms and legs every hour while you travel.  Avoid sitting or lying still for too long. If possible, get up and walk around one time every hour. Ask your health care provider when this is safe for you to do. SEEK IMMEDIATE MEDICAL CARE IF:  You have new or increased pain, swelling, or redness in an arm or leg.  You have numbness or tingling in an arm or leg.  You have shortness of breath while active or at rest.  You have chest pain.  You have a rapid or irregular heartbeat.  You feel light-headed or dizzy.  You cough up blood.  You notice blood in your vomit, bowel movement, or urine. These symptoms may represent a serious problem that is an emergency. Do not wait to see if the symptoms will go away. Get medical help right away. Call your local emergency services (911 in the [place] Do not drive yourself to the hospital.   This information is not intended to replace advice given to you by your health care provider. Make sure you discuss any questions you have with your health care provider.   Document Released: 05/02/2009 Document Revised: 02/02/2015 Document Reviewed: 09/08/2014 Elsevier Interactive Patient Education 2016 [employer]  

## 2015-08-11 LAB — ARTHRITIS PANEL
BASOS: 0 %
Basophils Absolute: 0 10*3/uL (ref 0.0–0.2)
EOS (ABSOLUTE): 0.3 10*3/uL (ref 0.0–0.4)
Eos: 3 %
HEMATOCRIT: 43.8 % (ref 34.0–46.6)
HEMOGLOBIN: 14.8 g/dL (ref 11.1–15.9)
Immature Grans (Abs): 0 10*3/uL (ref 0.0–0.1)
Immature Granulocytes: 0 %
Lymphocytes Absolute: 2.9 10*3/uL (ref 0.7–3.1)
Lymphs: 29 %
MCH: 32.7 pg (ref 26.6–33.0)
MCHC: 33.8 g/dL (ref 31.5–35.7)
MCV: 97 fL (ref 79–97)
MONOS ABS: 0.7 10*3/uL (ref 0.1–0.9)
Monocytes: 7 %
NEUTROS ABS: 6 10*3/uL (ref 1.4–7.0)
NEUTROS PCT: 61 %
Platelets: 275 10*3/uL (ref 150–379)
RBC: 4.53 x10E6/uL (ref 3.77–5.28)
RDW: 13.7 % (ref 12.3–15.4)
RHEUMATOID FACTOR: 12.5 [IU]/mL (ref 0.0–13.9)
Sed Rate: 5 mm/hr (ref 0–40)
URIC ACID: 6.3 mg/dL (ref 2.5–7.1)
WBC: 10 10*3/uL (ref 3.4–10.8)

## 2015-08-19 ENCOUNTER — Encounter: Payer: Self-pay | Admitting: Family

## 2015-08-19 ENCOUNTER — Ambulatory Visit (INDEPENDENT_AMBULATORY_CARE_PROVIDER_SITE_OTHER): Payer: Medicaid Other | Admitting: Family

## 2015-08-19 VITALS — BP 108/51 | HR 68 | Temp 97.1°F | Ht 63.0 in | Wt 195.0 lb

## 2015-08-19 DIAGNOSIS — I824Y2 Acute embolism and thrombosis of unspecified deep veins of left proximal lower extremity: Secondary | ICD-10-CM

## 2015-08-19 DIAGNOSIS — Z86718 Personal history of other venous thrombosis and embolism: Secondary | ICD-10-CM | POA: Insufficient documentation

## 2015-08-19 DIAGNOSIS — M25562 Pain in left knee: Secondary | ICD-10-CM | POA: Diagnosis not present

## 2015-08-19 DIAGNOSIS — R5383 Other fatigue: Secondary | ICD-10-CM | POA: Diagnosis not present

## 2015-08-19 MED ORDER — TRAMADOL HCL 50 MG PO TABS: 50.0000 mg | ORAL_TABLET | Freq: Two times a day (BID) | ORAL | Status: DC | PRN

## 2015-08-19 MED ORDER — RIVAROXABAN 20 MG PO TABS: 20.0000 mg | ORAL_TABLET | Freq: Every day | ORAL | Status: DC

## 2015-08-19 NOTE — Addendum Note (Signed)
Addended by: Evelina Dun A on: 08/19/2015 04:23 PM   Modules accepted: Orders

## 2015-08-19 NOTE — Patient Instructions (Signed)
Venous Thromboembolism Prevention Venous thromboembolism (VTE) is a condition in which a blood clot (thrombus) develops in the body. A thrombus usually occurs in a deep vein in the leg or the pelvis (DVT), but it can also occur in the arm. Sometimes, pieces of a thrombus can break off from its original place of development and travel through the bloodstream to other parts of the body. When that happens, the thrombus is called an embolus. An embolus that travels to one or both lungs is called a pulmonary embolism. An embolism can block the blood flow in the blood vessels of other organs as well. VTE is a serious health condition that can cause disability or death. It is very important to get help right away and to not ignore symptoms. HOW CAN A VTE BE PREVENTED?  Exercise regularly. Take a brisk 30 minute walk every day. Staying active and moving around can help you to prevent blood clots.  Avoid sitting or lying in bed for long periods of time without moving your legs. Change your position often, especially during long-distance travel (over 4 hours).  If you are a woman who is over 63 years of age, avoid unnecessary use of medicines that contain estrogen. These include birth control pills and hormone replacement therapy.  Do not smoke, especially if you take estrogen medicines. If you need help quitting, ask your health care provider.  Eat plenty of fruits and vegetables. Ask your health care provider or dietitian if there are foods that you should avoid.  Maintain a weight that is appropriate for your height. Ask your health care provider what weight is healthy for you.  Wear loose-fitting clothing. Avoid constrictive or tight clothing around your legs or waist.  Try not to bump or injure your legs. Avoid crossing your legs when you are sitting.  Do not use pillows under your knees while lying down unless told by your health care provider.  Wear support hose (compression stockings or TED  hose) as told by your health care provider Compression stockings increase blood flow in your legs and can help prevent blood clots. Do not let them bunch up when you are wearing them. HOW CAN I PREVENT VTE WHEN I TRAVEL? Long-distance travel (over 4 hours) can increase the risk of a VTE. To prevent VTE when traveling:  Exercise your legs every hour by standing, stretching, and bending and straightening your legs. If you are traveling by airplane, train, or bus, walk up and down the aisle as often as possible to get your blood moving. If you are traveling by car, stop and get out of the car every hour to exercise your legs and stretch. Other types of exercise might include:  Keeping your feet flat on the ground and raising your toes.  Switching from tightening the muscles in your calves and thighs to relaxing those same muscles while you are sitting.  Pointing and flexing your feet at the ankle joints while you are sitting.  Stay well hydrated while traveling. Drink enough water to keep your urine clear or pale yellow.  Avoid drinking alcohol during long travel. Generally, it is not recommended that you take medicines to prevent DVT during routine travel. HOW CAN VTE BE PREVENTED IF I AM HOSPITALIZED? A VTE may be prevented by taking medicines that are prescribed to prevent blood clots (anticoagulants). You can also help to prevent VTE while in the hospital by taking these actions:  Get out of bed and walk. Ask your health care provider  if this is safe for you to do.  Request the use of a sequential compression device (SCD). This is a machine that pumps air into compression sleeves that are wrapped around your legs.  Request the use of compression stockings, which are tight, elastic stockings that apply pressure to the lower legs. Compression stockings are sometimes used with SCDs. HOW CAN I PREVENT VTE AFTER SURGERY? Understand that there is an increased risk for VTE for the first 4-6 weeks  after surgery. During this time:  Avoid long-distance travel (over 4 hours). If you must travel during this time, ask your health care provider about additional preventive actions that you can take. These might include exercising your arms and legs every hour while you travel.  Avoid sitting or lying still for too long. If possible, get up and walk around one time every hour. Ask your health care provider when this is safe for you to do. SEEK IMMEDIATE MEDICAL CARE IF:  You have new or increased pain, swelling, or redness in an arm or leg.  You have numbness or tingling in an arm or leg.  You have shortness of breath while active or at rest.  You have chest pain.  You have a rapid or irregular heartbeat.  You feel light-headed or dizzy.  You cough up blood.  You notice blood in your vomit, bowel movement, or urine. These symptoms may represent a serious problem that is an emergency. Do not wait to see if the symptoms will go away. Get medical help right away. Call your local emergency services (911 in the [place] Do not drive yourself to the hospital.   This information is not intended to replace advice given to you by your health care provider. Make sure you discuss any questions you have with your health care provider.   Document Released: 05/02/2009 Document Revised: 02/02/2015 Document Reviewed: 09/08/2014 Elsevier Interactive Patient Education 2016 [employer] Inc[employer]  

## 2015-08-19 NOTE — Progress Notes (Signed)
   Subjective:    Patient ID: Holly Price, female    DOB: 10/16/1950, 65 y.o.   MRN: [id number]  HPI Pt presents to the office today for left knee pain. Pt was seen in the office on 08/10/15 of left calf pain and was diagnosed with a DVT. Pt was started on xarelto 15 mg. Pt reports not having any concerns or complaints at this time. PT states her calf pain is gone, but continues to have constant left knee pain of a 7 out 10. Pt has not taken any thing for her knee. Pt denies any SOB or chest pains.    Review of Systems  Constitutional: Negative.   HENT: Negative.   Eyes: Negative.   Respiratory: Negative.  Negative for shortness of breath.   Cardiovascular: Negative.  Negative for palpitations.  Gastrointestinal: Negative.   Endocrine: Negative.   Genitourinary: Negative.   Musculoskeletal: Negative.   Neurological: Negative.  Negative for headaches.  Hematological: Negative.   Psychiatric/Behavioral: Negative.   All other systems reviewed and are negative.      Objective:   Physical Exam  Constitutional: She is oriented to person, place, and time. She appears well-developed and well-nourished. No distress.  HENT:  Head: Normocephalic and atraumatic.  Right Ear: External ear normal.  Mouth/Throat: Oropharynx is clear and moist.  Eyes: Pupils are equal, round, and reactive to light.  Neck: Normal range of motion. Neck supple. No thyromegaly present.  Cardiovascular: Normal rate, regular rhythm, normal heart sounds and intact distal pulses.   No murmur heard. Pulmonary/Chest: Effort normal and breath sounds normal. No respiratory distress. She has no wheezes.  Abdominal: Soft. Bowel sounds are normal. She exhibits no distension. There is no tenderness.  Musculoskeletal: Normal range of motion. She exhibits no edema or tenderness.  Neurological: She is alert and oriented to person, place, and time. She has normal reflexes. No cranial nerve deficit.  Skin: Skin is warm and  dry.  Psychiatric: She has a normal mood and affect. Her behavior is normal. Judgment and thought content normal.  Vitals reviewed.   BP 108/51 mmHg  Pulse 68  Temp(Src) 97.1 F (36.2 C) (Oral)  Ht '5\' 3"'$  (1.6 m)  Wt 195 lb (88.451 kg)  BMI 34.55 kg/m2       Assessment & Plan:  1. Acute deep vein thrombosis (DVT) of proximal vein of left lower extremity (HCC) -DVT prevention discussed -Discussed importance of going to ED with SOB or chest pain -Pt to start xarelto 20 mg daily after 21 days of xarelto 15 mg BID - CMP14+EGFR - CBC with Differential/Platelet - rivaroxaban (XARELTO) 20 MG TABS tablet; Take 1 tablet (20 mg total) by mouth daily with supper.  Dispense: 90 tablet; Refill: 1  2. Left knee pain -Pt's ultracet d/c today and pt started on Ultram 50-100 mg BID - CMP14+EGFR - CBC with Differential/Platelet - traMADol (ULTRAM) 50 MG tablet; Take 1-2 tablets (50-100 mg total) by mouth every 12 (twelve) hours as needed.  Dispense: 90 tablet; Refill: 0  Evelina Dun, FNP

## 2015-08-22 LAB — CBC WITH DIFFERENTIAL/PLATELET
BASOS ABS: 0 10*3/uL (ref 0.0–0.2)
Basos: 0 %
EOS (ABSOLUTE): 0.2 10*3/uL (ref 0.0–0.4)
Eos: 2 %
Hematocrit: 39.2 % (ref 34.0–46.6)
Hemoglobin: 13.1 g/dL (ref 11.1–15.9)
Immature Grans (Abs): 0 10*3/uL (ref 0.0–0.1)
Immature Granulocytes: 0 %
LYMPHS ABS: 2.7 10*3/uL (ref 0.7–3.1)
Lymphs: 33 %
MCH: 31.4 pg (ref 26.6–33.0)
MCHC: 33.4 g/dL (ref 31.5–35.7)
MCV: 94 fL (ref 79–97)
MONOS ABS: 0.6 10*3/uL (ref 0.1–0.9)
Monocytes: 7 %
NEUTROS ABS: 4.7 10*3/uL (ref 1.4–7.0)
Neutrophils: 58 %
Platelets: 233 10*3/uL (ref 150–379)
RBC: 4.17 x10E6/uL (ref 3.77–5.28)
RDW: 13.4 % (ref 12.3–15.4)
WBC: 8.2 10*3/uL (ref 3.4–10.8)

## 2015-08-22 LAB — CMP14+EGFR
ALBUMIN: 4 g/dL (ref 3.6–4.8)
ALT: 17 IU/L (ref 0–32)
AST: 20 IU/L (ref 0–40)
Albumin/Globulin Ratio: 1.7 (ref 1.2–2.2)
Alkaline Phosphatase: 38 IU/L — ABNORMAL LOW (ref 39–117)
BUN / CREAT RATIO: 34 — AB (ref 11–26)
BUN: 36 mg/dL — AB (ref 8–27)
Bilirubin Total: 0.5 mg/dL (ref 0.0–1.2)
CALCIUM: 9.3 mg/dL (ref 8.7–10.3)
CO2: 25 mmol/L (ref 18–29)
CREATININE: 1.07 mg/dL — AB (ref 0.57–1.00)
Chloride: 100 mmol/L (ref 96–106)
GFR calc non Af Amer: 56 mL/min/{1.73_m2} — ABNORMAL LOW (ref >59)
GFR, EST AFRICAN AMERICAN: 64 mL/min/{1.73_m2} (ref >59)
GLUCOSE: 165 mg/dL — AB (ref 65–99)
Globulin, Total: 2.3 g/dL (ref 1.5–4.5)
Potassium: 3.9 mmol/L (ref 3.5–5.2)
Sodium: 143 mmol/L (ref 134–144)
TOTAL PROTEIN: 6.3 g/dL (ref 6.0–8.5)

## 2015-08-22 LAB — LYME AB/WESTERN BLOT REFLEX
LYME DISEASE AB, QUANT, IGM: <0.8 index (ref 0.00–0.79)
Lyme IgG/IgM Ab: <0.91 {ISR} (ref 0.00–0.90)

## 2015-08-22 LAB — ROCKY MTN SPOTTED FVR ABS PNL(IGG+IGM)
RMSF IGG: NEGATIVE
RMSF IGM: 0.18 {index} (ref 0.00–0.89)

## 2015-08-23 ENCOUNTER — Telehealth: Payer: Self-pay | Admitting: Family

## 2015-08-23 NOTE — Telephone Encounter (Signed)
Patient aware of results and verbalizes understanding.  

## 2015-08-27 ENCOUNTER — Other Ambulatory Visit: Payer: Self-pay | Admitting: Family

## 2015-08-30 LAB — HM DIABETES EYE EXAM

## 2015-09-16 ENCOUNTER — Ambulatory Visit (INDEPENDENT_AMBULATORY_CARE_PROVIDER_SITE_OTHER): Payer: Medicaid Other | Admitting: Family

## 2015-09-16 ENCOUNTER — Encounter (INDEPENDENT_AMBULATORY_CARE_PROVIDER_SITE_OTHER): Payer: Self-pay

## 2015-09-16 ENCOUNTER — Encounter: Payer: Self-pay | Admitting: Family

## 2015-09-16 VITALS — BP 131/63 | HR 63 | Temp 97.5°F | Ht 63.0 in | Wt 196.0 lb

## 2015-09-16 DIAGNOSIS — L309 Dermatitis, unspecified: Secondary | ICD-10-CM | POA: Diagnosis not present

## 2015-09-16 DIAGNOSIS — I824Y2 Acute embolism and thrombosis of unspecified deep veins of left proximal lower extremity: Secondary | ICD-10-CM

## 2015-09-16 MED ORDER — RIVAROXABAN 20 MG PO TABS: 20.0000 mg | ORAL_TABLET | Freq: Every day | ORAL | Status: DC

## 2015-09-16 MED ORDER — TRIAMCINOLONE ACETONIDE 0.025 % EX OINT: 1.0000 "application " | TOPICAL_OINTMENT | Freq: Two times a day (BID) | CUTANEOUS | Status: DC

## 2015-09-16 NOTE — Progress Notes (Signed)
   Subjective:    Patient ID: Holly Price, female    DOB: 07-26-1950, 65 y.o.   MRN: [id number]  HPI PT presents to the office today with bilateral hands "skin peeling off". Pt states this has been going on for over two weeks and is unchanged. PT denies any pain or itching. PT states she has tired moisturizer with mild relief. PT states she had been using some new hair product, but other than that she has not had any contact with any other new chemicals.     Review of Systems  Constitutional: Negative.   HENT: Negative.   Eyes: Negative.   Respiratory: Negative.  Negative for shortness of breath.   Cardiovascular: Negative.  Negative for palpitations.  Gastrointestinal: Negative.   Endocrine: Negative.   Genitourinary: Negative.   Musculoskeletal: Negative.   Neurological: Negative.  Negative for headaches.  Hematological: Negative.   Psychiatric/Behavioral: Negative.   All other systems reviewed and are negative.      Objective:   Physical Exam  Constitutional: She is oriented to person, place, and time. She appears well-developed and well-nourished. No distress.  HENT:  Head: Normocephalic and atraumatic.  Eyes: Pupils are equal, round, and reactive to light.  Neck: Normal range of motion. Neck supple. No thyromegaly present.  Cardiovascular: Normal rate, regular rhythm, normal heart sounds and intact distal pulses.   No murmur heard. Pulmonary/Chest: Effort normal and breath sounds normal. No respiratory distress. She has no wheezes.  Abdominal: Soft. Bowel sounds are normal. She exhibits no distension. There is no tenderness.  Musculoskeletal: Normal range of motion. She exhibits no edema or tenderness.  Neurological: She is alert and oriented to person, place, and time.  Skin: Skin is warm and dry. There is erythema (bilateral hands peeling, mild erythemas present in sections).  Psychiatric: She has a normal mood and affect. Her behavior is normal. Judgment and thought  content normal.  Vitals reviewed.   BP 131/63 mmHg  Pulse 63  Temp(Src) 97.5 F (36.4 C) (Oral)  Ht 5\' 3"  (1.6 m)  Wt 196 lb (88.905 kg)  BMI 34.73 kg/m2       Assessment & Plan:  1. Eczema of both hands -Keep moisturizer on hands -Avoid harsh chemicals on hands -Do not scratch  - triamcinolone (KENALOG) 0.025 % ointment; Apply 1 application topically 2 (two) times daily.  Dispense: 30 g; Refill: 0  Evelina Dun, FNP

## 2015-09-16 NOTE — Patient Instructions (Signed)
Contact Dermatitis Dermatitis is redness, soreness, and swelling (inflammation) of the skin. Contact dermatitis is a reaction to certain substances that touch the skin. There are two types of contact dermatitis:   Irritant contact dermatitis. This type is caused by something that irritates your skin, such as dry hands from washing them too much. This type does not require previous exposure to the substance for a reaction to occur. This type is more common.  Allergic contact dermatitis. This type is caused by a substance that you are allergic to, such as a nickel allergy or poison ivy. This type only occurs if you have been exposed to the substance (allergen) before. Upon a repeat exposure, your body reacts to the substance. This type is less common. CAUSES  Many different substances can cause contact dermatitis. Irritant contact dermatitis is most commonly caused by exposure to:   Makeup.   Soaps.   Detergents.   Bleaches.   Acids.   Metal salts, such as nickel.  Allergic contact dermatitis is most commonly caused by exposure to:   Poisonous plants.   Chemicals.   Jewelry.   Latex.   Medicines.   Preservatives in products, such as clothing.  RISK FACTORS This condition is more likely to develop in:   People who have jobs that expose them to irritants or allergens.  People who have certain medical conditions, such as asthma or eczema.  SYMPTOMS  Symptoms of this condition may occur anywhere on your body where the irritant has touched you or is touched by you. Symptoms include:  Dryness or flaking.   Redness.   Cracks.   Itching.   Pain or a burning feeling.   Blisters.  Drainage of small amounts of blood or clear fluid from skin cracks. With allergic contact dermatitis, there may also be swelling in areas such as the eyelids, mouth, or genitals.  DIAGNOSIS  This condition is diagnosed with a medical history and physical exam. A patch skin test  may be performed to help determine the cause. If the condition is related to your job, you may need to see an occupational medicine specialist. TREATMENT Treatment for this condition includes figuring out what caused the reaction and protecting your skin from further contact. Treatment may also include:   Steroid creams or ointments. Oral steroid medicines may be needed in more severe cases.  Antibiotics or antibacterial ointments, if a skin infection is present.  Antihistamine lotion or an antihistamine taken by mouth to ease itching.  A bandage (dressing). HOME CARE INSTRUCTIONS Skin Care  Moisturize your skin as needed.   Apply cool compresses to the affected areas.  Try taking a bath with:  Epsom salts. Follow the instructions on the packaging. You can get these at your local pharmacy or grocery store.  Baking soda. Pour a small amount into the bath as directed by your health care provider.  Colloidal oatmeal. Follow the instructions on the packaging. You can get this at your local pharmacy or grocery store.  Try applying baking soda paste to your skin. Stir water into baking soda until it reaches a paste-like consistency.  Do not scratch your skin.  Bathe less frequently, such as every other day.  Bathe in lukewarm water. Avoid using hot water. Medicines  Take or apply over-the-counter and prescription medicines only as told by your health care provider.   If you were prescribed an antibiotic medicine, take or apply your antibiotic as told by your health care provider. Do not stop using the   antibiotic even if your condition starts to improve. General Instructions  Keep all follow-up visits as told by your health care provider. This is important.  Avoid the substance that caused your reaction. If you do not know what caused it, keep a journal to try to track what caused it. Write down:  What you eat.  What cosmetic products you use.  What you drink.  What  you wear in the affected area. This includes jewelry.  If you were given a dressing, take care of it as told by your health care provider. This includes when to change and remove it. SEEK MEDICAL CARE IF:   Your condition does not improve with treatment.  Your condition gets worse.  You have signs of infection such as swelling, tenderness, redness, soreness, or warmth in the affected area.  You have a fever.  You have new symptoms. SEEK IMMEDIATE MEDICAL CARE IF:   You have a severe headache, neck pain, or neck stiffness.  You vomit.  You feel very sleepy.  You notice red streaks coming from the affected area.  Your bone or joint underneath the affected area becomes painful after the skin has healed.  The affected area turns darker.  You have difficulty breathing.   This information is not intended to replace advice given to you by your health care provider. Make sure you discuss any questions you have with your health care provider.   Document Released: 05/11/2000 Document Revised: 02/02/2015 Document Reviewed: 09/29/2014 Elsevier Interactive Patient Education 2016 [employer]  

## 2015-09-22 ENCOUNTER — Ambulatory Visit: Payer: Medicaid Other | Admitting: Physical Medicine & Rehabilitation

## 2015-09-25 ENCOUNTER — Other Ambulatory Visit: Payer: Self-pay | Admitting: Family

## 2015-09-26 NOTE — Telephone Encounter (Signed)
Last filled 08/29/15, last seen 08/19/15. Route to pool, [facility]

## 2015-10-22 ENCOUNTER — Other Ambulatory Visit: Payer: Self-pay | Admitting: Family

## 2015-10-22 ENCOUNTER — Other Ambulatory Visit: Payer: Self-pay | Admitting: Nurse Practitioner

## 2015-10-25 NOTE — Telephone Encounter (Signed)
Last seen 09/16/15  Holly Price  If approved route to nurse to call into [facility]

## 2015-10-27 ENCOUNTER — Other Ambulatory Visit: Payer: Self-pay | Admitting: Nurse Practitioner

## 2015-11-22 ENCOUNTER — Ambulatory Visit (INDEPENDENT_AMBULATORY_CARE_PROVIDER_SITE_OTHER): Payer: Medicaid Other | Admitting: Family

## 2015-11-22 ENCOUNTER — Encounter: Payer: Self-pay | Admitting: Family

## 2015-11-22 VITALS — BP 131/55 | HR 61 | Temp 97.1°F | Ht 63.0 in | Wt 195.2 lb

## 2015-11-22 DIAGNOSIS — E785 Hyperlipidemia, unspecified: Secondary | ICD-10-CM

## 2015-11-22 DIAGNOSIS — Z794 Long term (current) use of insulin: Secondary | ICD-10-CM

## 2015-11-22 DIAGNOSIS — I10 Essential (primary) hypertension: Secondary | ICD-10-CM | POA: Diagnosis not present

## 2015-11-22 DIAGNOSIS — E669 Obesity, unspecified: Secondary | ICD-10-CM | POA: Diagnosis not present

## 2015-11-22 DIAGNOSIS — M25511 Pain in right shoulder: Secondary | ICD-10-CM | POA: Diagnosis not present

## 2015-11-22 DIAGNOSIS — E8881 Metabolic syndrome: Secondary | ICD-10-CM

## 2015-11-22 DIAGNOSIS — E1142 Type 2 diabetes mellitus with diabetic polyneuropathy: Secondary | ICD-10-CM | POA: Diagnosis not present

## 2015-11-22 DIAGNOSIS — E1140 Type 2 diabetes mellitus with diabetic neuropathy, unspecified: Secondary | ICD-10-CM | POA: Insufficient documentation

## 2015-11-22 DIAGNOSIS — E0800 Diabetes mellitus due to underlying condition with hyperosmolarity without nonketotic hyperglycemic-hyperosmolar coma (NKHHC): Secondary | ICD-10-CM | POA: Diagnosis not present

## 2015-11-22 DIAGNOSIS — E559 Vitamin D deficiency, unspecified: Secondary | ICD-10-CM | POA: Diagnosis not present

## 2015-11-22 DIAGNOSIS — M549 Dorsalgia, unspecified: Secondary | ICD-10-CM

## 2015-11-22 DIAGNOSIS — I824Y2 Acute embolism and thrombosis of unspecified deep veins of left proximal lower extremity: Secondary | ICD-10-CM | POA: Diagnosis not present

## 2015-11-22 DIAGNOSIS — E663 Overweight: Secondary | ICD-10-CM | POA: Insufficient documentation

## 2015-11-22 DIAGNOSIS — Z1159 Encounter for screening for other viral diseases: Secondary | ICD-10-CM

## 2015-11-22 LAB — BAYER DCA HB A1C WAIVED: HB A1C: 5.9 % (ref <7.0)

## 2015-11-22 MED ORDER — ATENOLOL 50 MG PO TABS: ORAL_TABLET | ORAL | Status: DC

## 2015-11-22 MED ORDER — TRAMADOL HCL 50 MG PO TABS: ORAL_TABLET | ORAL | Status: DC

## 2015-11-22 MED ORDER — DICLOFENAC SODIUM 1 % TD GEL: 2.0000 g | Freq: Four times a day (QID) | TRANSDERMAL | Status: DC

## 2015-11-22 MED ORDER — RIVAROXABAN 20 MG PO TABS: 20.0000 mg | ORAL_TABLET | Freq: Every day | ORAL | Status: DC

## 2015-11-22 MED ORDER — MELOXICAM 15 MG PO TABS: ORAL_TABLET | ORAL | Status: DC

## 2015-11-22 MED ORDER — GABAPENTIN 300 MG PO CAPS: 300.0000 mg | ORAL_CAPSULE | Freq: Two times a day (BID) | ORAL | Status: DC

## 2015-11-22 MED ORDER — LISINOPRIL-HYDROCHLOROTHIAZIDE 20-12.5 MG PO TABS: 2.0000 | ORAL_TABLET | Freq: Every day | ORAL | Status: DC

## 2015-11-22 MED ORDER — GLIMEPIRIDE 4 MG PO TABS: ORAL_TABLET | ORAL | Status: DC

## 2015-11-22 MED ORDER — LIRAGLUTIDE 18 MG/3ML ~~LOC~~ SOPN: PEN_INJECTOR | SUBCUTANEOUS | Status: DC

## 2015-11-22 MED ORDER — PITAVASTATIN CALCIUM 2 MG PO TABS: ORAL_TABLET | ORAL | Status: DC

## 2015-11-22 NOTE — Patient Instructions (Signed)
Health Maintenance, Female Adopting a healthy lifestyle and getting preventive care can go a long way to promote health and wellness. Talk with your health care provider about what schedule of regular examinations is right for you. This is a good chance for you to check in with your provider about disease prevention and staying healthy. In between checkups, there are plenty of things you can do on your own. Experts have done a lot of research about which lifestyle changes and preventive measures are most likely to keep you healthy. Ask your health care provider for more information. WEIGHT AND DIET  Eat a healthy diet  Be sure to include plenty of vegetables, fruits, low-fat dairy products, and lean protein.  Do not eat a lot of foods high in solid fats, added sugars, or salt.  Get regular exercise. This is one of the most important things you can do for your health.  Most adults should exercise for at least 150 minutes each week. The exercise should increase your heart rate and make you sweat (moderate-intensity exercise).  Most adults should also do strengthening exercises at least twice a week. This is in addition to the moderate-intensity exercise.  Maintain a healthy weight  Body mass index (BMI) is a measurement that can be used to identify possible weight problems. It estimates body fat based on height and weight. Your health care provider can help determine your BMI and help you achieve or maintain a healthy weight.  For females 20 years of age and older:   A BMI below 18.5 is considered underweight.  A BMI of 18.5 to 24.9 is normal.  A BMI of 25 to 29.9 is considered overweight.  A BMI of 30 and above is considered obese.  Watch levels of cholesterol and blood lipids  You should start having your blood tested for lipids and cholesterol at 63 years of age, then have this test every 5 years.  You may need to have your cholesterol levels checked more often if:  Your lipid  or cholesterol levels are high.  You are older than 63 years of age.  You are at high risk for heart disease.  CANCER SCREENING   Lung Cancer  Lung cancer screening is recommended for adults 55-80 years old who are at high risk for lung cancer because of a history of smoking.  A yearly low-dose CT scan of the lungs is recommended for people who:  Currently smoke.  Have quit within the past 15 years.  Have at least a 30-pack-year history of smoking. A pack year is smoking an average of one pack of cigarettes a day for 1 year.  Yearly screening should continue until it has been 15 years since you quit.  Yearly screening should stop if you develop a health problem that would prevent you from having lung cancer treatment.  Breast Cancer  Practice breast self-awareness. This means understanding how your breasts normally appear and feel.  It also means doing regular breast self-exams. Let your health care provider know about any changes, no matter how small.  If you are in your 20s or 30s, you should have a clinical breast exam (CBE) by a health care provider every 1-3 years as part of a regular health exam.  If you are 40 or older, have a CBE every year. Also consider having a breast X-ray (mammogram) every year.  If you have a family history of breast cancer, talk to your health care provider about genetic screening.  If you   are at high risk for breast cancer, talk to your health care provider about having an MRI and a mammogram every year.  Breast cancer gene (BRCA) assessment is recommended for women who have family members with BRCA-related cancers. BRCA-related cancers include:  Breast.  Ovarian.  Tubal.  Peritoneal cancers.  Results of the assessment will determine the need for genetic counseling and BRCA1 and BRCA2 testing. Cervical Cancer Your health care provider may recommend that you be screened regularly for cancer of the pelvic organs (ovaries, uterus, and  vagina). This screening involves a pelvic examination, including checking for microscopic changes to the surface of your cervix (Pap test). You may be encouraged to have this screening done every 3 years, beginning at age 21.  For women ages 30-65, health care providers may recommend pelvic exams and Pap testing every 3 years, or they may recommend the Pap and pelvic exam, combined with testing for human papilloma virus (HPV), every 5 years. Some types of HPV increase your risk of cervical cancer. Testing for HPV may also be done on women of any age with unclear Pap test results.  Other health care providers may not recommend any screening for nonpregnant women who are considered low risk for pelvic cancer and who do not have symptoms. Ask your health care provider if a screening pelvic exam is right for you.  If you have had past treatment for cervical cancer or a condition that could lead to cancer, you need Pap tests and screening for cancer for at least 20 years after your treatment. If Pap tests have been discontinued, your risk factors (such as having a new sexual partner) need to be reassessed to determine if screening should resume. Some women have medical problems that increase the chance of getting cervical cancer. In these cases, your health care provider may recommend more frequent screening and Pap tests. Colorectal Cancer  This type of cancer can be detected and often prevented.  Routine colorectal cancer screening usually begins at 63 years of age and continues through 63 years of age.  Your health care provider may recommend screening at an earlier age if you have risk factors for colon cancer.  Your health care provider may also recommend using home test kits to check for hidden blood in the stool.  A small camera at the end of a tube can be used to examine your colon directly (sigmoidoscopy or colonoscopy). This is done to check for the earliest forms of colorectal  cancer.  Routine screening usually begins at age 50.  Direct examination of the colon should be repeated every 5-10 years through 63 years of age. However, you may need to be screened more often if early forms of precancerous polyps or small growths are found. Skin Cancer  Check your skin from head to toe regularly.  Tell your health care provider about any new moles or changes in moles, especially if there is a change in a mole's shape or color.  Also tell your health care provider if you have a mole that is larger than the size of a pencil eraser.  Always use sunscreen. Apply sunscreen liberally and repeatedly throughout the day.  Protect yourself by wearing long sleeves, pants, a wide-brimmed hat, and sunglasses whenever you are outside. HEART DISEASE, DIABETES, AND HIGH BLOOD PRESSURE   High blood pressure causes heart disease and increases the risk of stroke. High blood pressure is more likely to develop in:  People who have blood pressure in the high end   of the normal range (130-139/85-89 mm Hg).  People who are overweight or obese.  People who are African American.  If you are 38-23 years of age, have your blood pressure checked every 3-5 years. If you are 61 years of age or older, have your blood pressure checked every year. You should have your blood pressure measured twice--once when you are at a hospital or clinic, and once when you are not at a hospital or clinic. Record the average of the two measurements. To check your blood pressure when you are not at a hospital or clinic, you can use:  An automated blood pressure machine at a pharmacy.  A home blood pressure monitor.  If you are between 45 years and 39 years old, ask your health care provider if you should take aspirin to prevent strokes.  Have regular diabetes screenings. This involves taking a blood sample to check your fasting blood sugar level.  If you are at a normal weight and have a low risk for diabetes,  have this test once every three years after 63 years of age.  If you are overweight and have a high risk for diabetes, consider being tested at a younger age or more often. PREVENTING INFECTION  Hepatitis B  If you have a higher risk for hepatitis B, you should be screened for this virus. You are considered at high risk for hepatitis B if:  You were born in a country where hepatitis B is common. Ask your health care provider which countries are considered high risk.  Your parents were born in a high-risk country, and you have not been immunized against hepatitis B (hepatitis B vaccine).  You have HIV or AIDS.  You use needles to inject street drugs.  You live with someone who has hepatitis B.  You have had sex with someone who has hepatitis B.  You get hemodialysis treatment.  You take certain medicines for conditions, including cancer, organ transplantation, and autoimmune conditions. Hepatitis C  Blood testing is recommended for:  Everyone born from [birth year] through 1963.  Anyone with known risk factors for hepatitis C. Sexually transmitted infections (STIs)  You should be screened for sexually transmitted infections (STIs) including gonorrhea and chlamydia if:  You are sexually active and are younger than 63 years of age.  You are older than 63 years of age and your health care provider tells you that you are at risk for this type of infection.  Your sexual activity has changed since you were last screened and you are at an increased risk for chlamydia or gonorrhea. Ask your health care provider if you are at risk.  If you do not have HIV, but are at risk, it may be recommended that you take a prescription medicine daily to prevent HIV infection. This is called pre-exposure prophylaxis (PrEP). You are considered at risk if:  You are sexually active and do not regularly use condoms or know the HIV status of your partner(s).  You take drugs by injection.  You are sexually  active with a partner who has HIV. Talk with your health care provider about whether you are at high risk of being infected with HIV. If you choose to begin PrEP, you should first be tested for HIV. You should then be tested every 3 months for as long as you are taking PrEP.  PREGNANCY   If you are premenopausal and you may become pregnant, ask your health care provider about preconception counseling.  If you may  become pregnant, take 400 to 800 micrograms (mcg) of folic acid every day.  If you want to prevent pregnancy, talk to your health care provider about birth control (contraception). OSTEOPOROSIS AND MENOPAUSE   Osteoporosis is a disease in which the bones lose minerals and strength with aging. This can result in serious bone fractures. Your risk for osteoporosis can be identified using a bone density scan.  If you are 61 years of age or older, or if you are at risk for osteoporosis and fractures, ask your health care provider if you should be screened.  Ask your health care provider whether you should take a calcium or vitamin D supplement to lower your risk for osteoporosis.  Menopause may have certain physical symptoms and risks.  Hormone replacement therapy may reduce some of these symptoms and risks. Talk to your health care provider about whether hormone replacement therapy is right for you.  HOME CARE INSTRUCTIONS   Schedule regular health, dental, and eye exams.  Stay current with your immunizations.   Do not use any tobacco products including cigarettes, chewing tobacco, or electronic cigarettes.  If you are pregnant, do not drink alcohol.  If you are breastfeeding, limit how much and how often you drink alcohol.  Limit alcohol intake to no more than 1 drink per day for nonpregnant women. One drink equals 12 ounces of beer, 5 ounces of wine, or 1 ounces of hard liquor.  Do not use street drugs.  Do not share needles.  Ask your health care provider for help if  you need support or information about quitting drugs.  Tell your health care provider if you often feel depressed.  Tell your health care provider if you have ever been abused or do not feel safe at home.   This information is not intended to replace advice given to you by your health care provider. Make sure you discuss any questions you have with your health care provider.   Document Released: 11/27/2010 Document Revised: 06/04/2014 Document Reviewed: 04/15/2013 Elsevier Interactive Patient Education [employer].

## 2015-11-22 NOTE — Progress Notes (Signed)
Subjective:    Patient ID: Holly Price, female    DOB: January 10, 1951, 65 y.o.   MRN: [id number]  PT presents to the office today for chronic follow up.  PT states she is losing her Medicaid on 11/25/15 and needs refills on all of her medications. Hypertension This is a new problem. The current episode started more than 1 year ago. The problem has been resolved since onset. The problem is controlled. Associated symptoms include anxiety. Pertinent negatives include no headaches, palpitations, peripheral edema or sweats. Risk factors for coronary artery disease include post-menopausal state and obesity. Past treatments include ACE inhibitors, beta blockers and diuretics. The current treatment provides significant improvement. Compliance problems include exercise.  There is no history of kidney disease, CAD/MI, CVA, heart failure or a thyroid problem. There is no history of sleep apnea.  Diabetes She presents for her follow-up diabetic visit. She has type 2 diabetes mellitus. Her disease course has been stable. Pertinent negatives for hypoglycemia include no headaches or sweats. Associated symptoms include foot paresthesias. Pertinent negatives for diabetes include no foot ulcerations and no visual change. There are no hypoglycemic complications. There are no diabetic complications. Pertinent negatives for diabetic complications include no CVA. Current diabetic treatment includes oral agent (monotherapy) and diet (Victoza). She is compliant with treatment all of the time. She is following a diabetic diet. She participates in exercise daily. Her breakfast blood glucose range is generally 110-130 mg/dl. An ACE inhibitor/angiotensin II receptor blocker is being taken. Eye exam is current.  Hyperlipidemia This is a new problem. The current episode started more than 1 year ago. The problem is controlled. Recent lipid tests were reviewed and are normal. Exacerbating diseases include diabetes. She has no history  of hypothyroidism. Factors aggravating her hyperlipidemia include fatty foods. Pertinent negatives include no leg pain. Current antihyperlipidemic treatment includes statins. The current treatment provides mild improvement of lipids. Compliance problems include adherence to exercise.  Risk factors for coronary artery disease include diabetes mellitus, dyslipidemia, family history, hypertension and post-menopausal.  Back Pain This is a chronic problem. The current episode started more than 1 year ago. The problem occurs intermittently. The problem has been waxing and waning since onset. The pain is present in the lumbar spine. The quality of the pain is described as aching. The pain is at a severity of 7/10. The pain is moderate. The symptoms are aggravated by bending. Pertinent negatives include no bladder incontinence, bowel incontinence, headaches or leg pain. She has tried analgesics for the symptoms. The treatment provided moderate relief.  DVT PT was diagnosed with a LLE DVT in March. PT continues to take xarelto 20 mg daily. Pt denies any swelling or SOB at this time.  Diabetic Neuropathy  Pt currently taking gabapentin 300 mg BID. Pt states this helps with the burning in her hands and feet.   Review of Systems  Constitutional: Negative.   Eyes: Negative.   Cardiovascular: Negative.  Negative for palpitations.  Gastrointestinal: Negative.  Negative for bowel incontinence.  Endocrine: Negative.   Genitourinary: Negative.  Negative for bladder incontinence.  Musculoskeletal: Positive for back pain.  Neurological: Negative for headaches.  Hematological: Negative.   Psychiatric/Behavioral: Negative.   All other systems reviewed and are negative.      Objective:   Physical Exam  Constitutional: She is oriented to person, place, and time. She appears well-developed and well-nourished. No distress.  HENT:  Head: Normocephalic and atraumatic.  Right Ear: External ear normal.  Left Ear:  External  ear normal.  Nose: Nose normal.  Mouth/Throat: Oropharynx is clear and moist.  Eyes: Pupils are equal, round, and reactive to light.  Neck: Normal range of motion. Neck supple. No thyromegaly present.  Cardiovascular: Normal rate, regular rhythm, normal heart sounds and intact distal pulses.   No murmur heard. Pulmonary/Chest: Effort normal. No respiratory distress. She has wheezes.  Abdominal: Soft. Bowel sounds are normal. She exhibits no distension. There is no tenderness.  Musculoskeletal: Normal range of motion. She exhibits no edema or tenderness.  Neurological: She is alert and oriented to person, place, and time. She has normal reflexes. No cranial nerve deficit.  Skin: Skin is warm and dry.  Psychiatric: She has a normal mood and affect. Her behavior is normal. Judgment and thought content normal.  Vitals reviewed.     BP 131/55 mmHg  Pulse 61  Temp(Src) 97.1 F (36.2 C) (Oral)  Ht 5' 3"  (1.6 m)  Wt 195 lb 3.2 oz (88.542 kg)  BMI 34.59 kg/m2     Assessment & Plan:  1. Essential hypertension - CMP14+EGFR - lisinopril-hydrochlorothiazide (PRINZIDE,ZESTORETIC) 20-12.5 MG tablet; Take 2 tablets by mouth daily.  Dispense: 180 tablet; Refill: 0 - atenolol (TENORMIN) 50 MG tablet; TAKE ONE (1) TABLET EACH DAY  Dispense: 90 tablet; Refill: 4  2. Diabetes mellitus due to underlying condition with hyperosmolarity without coma, without long-term current use of insulin (HCC) - CMP14+EGFR - Bayer DCA Hb A1c Waived - Liraglutide (VICTOZA) 18 MG/3ML SOPN; INJECT 1.8MCG SQ DAILY  Dispense: 9 mL; Refill: 1 - glimepiride (AMARYL) 4 MG tablet; TAKE ONE TABLET EACH MORNING BEFORE BREAKFAST  Dispense: 90 tablet; Refill: 0  3. Hyperlipidemia - CMP14+EGFR - Pitavastatin Calcium (LIVALO) 2 MG TABS; TAKE ONE (1) TABLET EACH DAY  Dispense: 90 tablet; Refill: 0  4. Pain in joint of right shoulder - CMP14+EGFR - traMADol (ULTRAM) 50 MG tablet; TAKE 1 TO 2 TABLETS EVERY 12 HOURS AS  NEEDED FOR PAIN  Dispense: 90 tablet; Refill: 3 - meloxicam (MOBIC) 15 MG tablet; TAKE ONE (1) TABLET EACH DAY  Dispense: 90 tablet; Refill: 0 - diclofenac sodium (VOLTAREN) 1 % GEL; Apply 2 g topically 4 (four) times daily.  Dispense: 300 g; Refill: 3  5. Metabolic syndrome - WSF68+LEXN  6. Vitamin D deficiency - CMP14+EGFR  7. Obesity (BMI 30-39.9) - CMP14+EGFR  8. Acute deep vein thrombosis (DVT) of proximal vein of left lower extremity (HCC) - CMP14+EGFR - rivaroxaban (XARELTO) 20 MG TABS tablet; Take 1 tablet (20 mg total) by mouth daily with supper.  Dispense: 90 tablet; Refill: 1  9. Mid back pain - CMP14+EGFR - traMADol (ULTRAM) 50 MG tablet; TAKE 1 TO 2 TABLETS EVERY 12 HOURS AS NEEDED FOR PAIN  Dispense: 90 tablet; Refill: 3 - gabapentin (NEURONTIN) 300 MG capsule; Take 1 capsule (300 mg total) by mouth 2 (two) times daily.  Dispense: 180 capsule; Refill: 11 - diclofenac sodium (VOLTAREN) 1 % GEL; Apply 2 g topically 4 (four) times daily.  Dispense: 300 g; Refill: 3  10. Diabetic polyneuropathy associated with type 2 diabetes mellitus (HCC) - CMP14+EGFR - Bayer DCA Hb A1c Waived - Liraglutide (VICTOZA) 18 MG/3ML SOPN; INJECT 1.8MCG SQ DAILY  Dispense: 9 mL; Refill: 1 - glimepiride (AMARYL) 4 MG tablet; TAKE ONE TABLET EACH MORNING BEFORE BREAKFAST  Dispense: 90 tablet; Refill: 0  11. Type 2 diabetes mellitus with diabetic polyneuropathy, with long-term current use of insulin (HCC) - CMP14+EGFR - gabapentin (NEURONTIN) 300 MG capsule; Take 1 capsule (300 mg  total) by mouth 2 (two) times daily.  Dispense: 180 capsule; Refill: 11  12. Need for hepatitis C screening test - CMP14+EGFR - Hepatitis C Antibody   Continue all meds Labs pending Health Maintenance reviewed Diet and exercise encouraged RTO 3 months  Pt is very anxious and upset about her insurance. PT is worried she will not be able to afford her medications and will have to start going to the [facility]. All of patient's medications were refilled today for 90 days.   Evelina Dun, FNP

## 2015-11-23 LAB — CMP14+EGFR
A/G RATIO: 1.7 (ref 1.2–2.2)
ALK PHOS: 39 IU/L (ref 39–117)
ALT: 12 IU/L (ref 0–32)
AST: 13 IU/L (ref 0–40)
Albumin: 4.1 g/dL (ref 3.6–4.8)
BILIRUBIN TOTAL: 0.2 mg/dL (ref 0.0–1.2)
BUN/Creatinine Ratio: 17 (ref 12–28)
BUN: 17 mg/dL (ref 8–27)
CO2: 25 mmol/L (ref 18–29)
Calcium: 9.4 mg/dL (ref 8.7–10.3)
Chloride: 99 mmol/L (ref 96–106)
Creatinine, Ser: 1 mg/dL (ref 0.57–1.00)
GFR calc non Af Amer: 61 mL/min/{1.73_m2} (ref >59)
GFR, EST AFRICAN AMERICAN: 70 mL/min/{1.73_m2} (ref >59)
GLUCOSE: 78 mg/dL (ref 65–99)
Globulin, Total: 2.4 g/dL (ref 1.5–4.5)
Potassium: 4.7 mmol/L (ref 3.5–5.2)
SODIUM: 142 mmol/L (ref 134–144)
Total Protein: 6.5 g/dL (ref 6.0–8.5)

## 2015-11-23 LAB — HEPATITIS C ANTIBODY

## 2017-01-01 ENCOUNTER — Ambulatory Visit (INDEPENDENT_AMBULATORY_CARE_PROVIDER_SITE_OTHER): Payer: Self-pay | Admitting: Family

## 2017-01-01 ENCOUNTER — Encounter: Payer: Self-pay | Admitting: Family

## 2017-01-01 VITALS — BP 139/57 | HR 72 | Temp 97.1°F | Ht 63.0 in | Wt 190.6 lb

## 2017-01-01 DIAGNOSIS — E669 Obesity, unspecified: Secondary | ICD-10-CM

## 2017-01-01 DIAGNOSIS — E785 Hyperlipidemia, unspecified: Secondary | ICD-10-CM

## 2017-01-01 DIAGNOSIS — E0800 Diabetes mellitus due to underlying condition with hyperosmolarity without nonketotic hyperglycemic-hyperosmolar coma (NKHHC): Secondary | ICD-10-CM

## 2017-01-01 DIAGNOSIS — I10 Essential (primary) hypertension: Secondary | ICD-10-CM

## 2017-01-01 DIAGNOSIS — M549 Dorsalgia, unspecified: Secondary | ICD-10-CM

## 2017-01-01 DIAGNOSIS — M25511 Pain in right shoulder: Secondary | ICD-10-CM

## 2017-01-01 DIAGNOSIS — E1142 Type 2 diabetes mellitus with diabetic polyneuropathy: Secondary | ICD-10-CM

## 2017-01-01 MED ORDER — TRAMADOL HCL 50 MG PO TABS: ORAL_TABLET | ORAL | 3 refills | Status: DC

## 2017-01-01 MED ORDER — ATENOLOL 50 MG PO TABS: ORAL_TABLET | ORAL | 4 refills | Status: DC

## 2017-01-01 MED ORDER — GLIMEPIRIDE 4 MG PO TABS: ORAL_TABLET | ORAL | 3 refills | Status: DC

## 2017-01-01 NOTE — Addendum Note (Signed)
Addended by: Evelina Dun A on: 01/01/2017 12:41 PM   Modules accepted: Orders

## 2017-01-01 NOTE — Progress Notes (Signed)
Subjective:    Patient ID: Holly Price, female    DOB: February 08, 1951, 66 y.o.   MRN: [id number]  Pt presents to the office today for chronic follow up. Pt states she lost her insurance about a year ago and has had lab work drawn at [facility].  Diabetes  She presents for her follow-up diabetic visit. She has type 2 diabetes mellitus. Her disease course has been stable. There are no hypoglycemic associated symptoms. Associated symptoms include foot paresthesias. Pertinent negatives for diabetes include no blurred vision and no visual change. There are no hypoglycemic complications. Symptoms are stable. Diabetic complications include peripheral neuropathy. Pertinent negatives for diabetic complications include no CVA, heart disease or nephropathy. Risk factors for coronary artery disease include dyslipidemia, diabetes mellitus, obesity, sedentary lifestyle and post-menopausal. She is following a diabetic diet. Her breakfast blood glucose range is generally 110-130 mg/dl. An ACE inhibitor/angiotensin II receptor blocker is being taken.  Hyperlipidemia  This is a chronic problem. The current episode started more than 1 year ago. The problem is controlled. Recent lipid tests were reviewed and are normal. Exacerbating diseases include obesity. Pertinent negatives include no shortness of breath. Current antihyperlipidemic treatment includes statins. The current treatment provides moderate improvement of lipids.  Hypertension  This is a chronic problem. The current episode started more than 1 year ago. The problem has been resolved since onset. The problem is controlled. Pertinent negatives include no blurred vision, malaise/fatigue, peripheral edema or shortness of breath. Risk factors for coronary artery disease include diabetes mellitus, dyslipidemia, obesity and sedentary lifestyle. There is no history of kidney disease, CAD/MI, CVA or heart failure.  Diabetic Neuropathy PT currently taking  gabapentin TID. States working well.    Review of Systems  Constitutional: Negative for malaise/fatigue.  Eyes: Negative for blurred vision.  Respiratory: Negative for shortness of breath.   All other systems reviewed and are negative.      Objective:   Physical Exam  Constitutional: She is oriented to person, place, and time. She appears well-developed and well-nourished. No distress.  HENT:  Head: Normocephalic and atraumatic.  Right Ear: External ear normal.  Left Ear: External ear normal.  Nose: Nose normal.  Mouth/Throat: Oropharynx is clear and moist.  Eyes: Pupils are equal, round, and reactive to light.  Neck: Normal range of motion. Neck supple. No thyromegaly present.  Cardiovascular: Normal rate, regular rhythm, normal heart sounds and intact distal pulses.   No murmur heard. Pulmonary/Chest: Effort normal and breath sounds normal. No respiratory distress. She has no wheezes.  Abdominal: Soft. Bowel sounds are normal. She exhibits no distension. There is no tenderness.  Musculoskeletal: Normal range of motion. She exhibits no edema or tenderness.  Neurological: She is alert and oriented to person, place, and time.  Skin: Skin is warm and dry.  Psychiatric: She has a normal mood and affect. Her behavior is normal. Judgment and thought content normal.  Vitals reviewed.   BP (!) 139/57   Pulse 72   Temp (!) 97.1 F (36.2 C) (Oral)   Ht 5\' 3"  (1.6 m)   Wt 190 lb 9.6 oz (86.5 kg)   BMI 33.76 kg/m      Assessment & Plan:  1. Essential hypertension - atenolol (TENORMIN) 50 MG tablet; TAKE ONE (1) TABLET EACH DAY  Dispense: 90 tablet; Refill: 4  2. Diabetes mellitus due to underlying condition with hyperosmolarity without coma, without long-term current use of insulin (HCC) - glimepiride (AMARYL) 4 MG tablet; TAKE ONE TABLET  EACH MORNING BEFORE BREAKFAST  Dispense: 90 tablet; Refill: 3  3. Hyperlipidemia, unspecified hyperlipidemia type  4. Obesity (BMI  30-39.9)  5. Diabetic polyneuropathy associated with type 2 diabetes mellitus (HCC) - glimepiride (AMARYL) 4 MG tablet; TAKE ONE TABLET EACH MORNING BEFORE BREAKFAST  Dispense: 90 tablet; Refill: 3   Continue all meds and keep financial assistance  Labs drawn at [facility] Maintenance reviewed Diet and exercise encouraged RTO 6 months, keep follow up with [place], FNP

## 2017-01-01 NOTE — Patient Instructions (Signed)
Diabetes Mellitus and Food It is important for you to manage your blood sugar (glucose) level. Your blood glucose level can be greatly affected by what you eat. Eating healthier foods in the appropriate amounts throughout the day at about the same time each day will help you control your blood glucose level. It can also help slow or prevent worsening of your diabetes mellitus. Healthy eating may even help you improve the level of your blood pressure and reach or maintain a healthy weight. General recommendations for healthful eating and cooking habits include:  Eating meals and snacks regularly. Avoid going long periods of time without eating to lose weight.  Eating a diet that consists mainly of plant-based foods, such as fruits, vegetables, nuts, legumes, and whole grains.  Using low-heat cooking methods, such as baking, instead of high-heat cooking methods, such as deep frying.  Work with your dietitian to make sure you understand how to use the Nutrition Facts information on food labels. How can food affect me? Carbohydrates Carbohydrates affect your blood glucose level more than any other type of food. Your dietitian will help you determine how many carbohydrates to eat at each meal and teach you how to count carbohydrates. Counting carbohydrates is important to keep your blood glucose at a healthy level, especially if you are using insulin or taking certain medicines for diabetes mellitus. Alcohol Alcohol can cause sudden decreases in blood glucose (hypoglycemia), especially if you use insulin or take certain medicines for diabetes mellitus. Hypoglycemia can be a life-threatening condition. Symptoms of hypoglycemia (sleepiness, dizziness, and disorientation) are similar to symptoms of having too much alcohol. If your health care provider has given you approval to drink alcohol, do so in moderation and use the following guidelines:  Women should not have more than one drink per day, and men  should not have more than two drinks per day. One drink is equal to: ? 12 oz of beer. ? 5 oz of wine. ? 1 oz of hard liquor.  Do not drink on an empty stomach.  Keep yourself hydrated. Have water, diet soda, or unsweetened iced tea.  Regular soda, juice, and other mixers might contain a lot of carbohydrates and should be counted.  What foods are not recommended? As you make food choices, it is important to remember that all foods are not the same. Some foods have fewer nutrients per serving than other foods, even though they might have the same number of calories or carbohydrates. It is difficult to get your body what it needs when you eat foods with fewer nutrients. Examples of foods that you should avoid that are high in calories and carbohydrates but low in nutrients include:  Trans fats (most processed foods list trans fats on the Nutrition Facts label).  Regular soda.  Juice.  Candy.  Sweets, such as cake, pie, doughnuts, and cookies.  Fried foods.  What foods can I eat? Eat nutrient-rich foods, which will nourish your body and keep you healthy. The food you should eat also will depend on several factors, including:  The calories you need.  The medicines you take.  Your weight.  Your blood glucose level.  Your blood pressure level.  Your cholesterol level.  You should eat a variety of foods, including:  Protein. ? Lean cuts of meat. ? Proteins low in saturated fats, such as fish, egg whites, and beans. Avoid processed meats.  Fruits and vegetables. ? Fruits and vegetables that may help control blood glucose levels, such as apples,   mangoes, and yams.  Dairy products. ? Choose fat-free or low-fat dairy products, such as milk, yogurt, and cheese.  Grains, bread, pasta, and rice. ? Choose whole grain products, such as multigrain bread, whole oats, and brown rice. These foods may help control blood pressure.  Fats. ? Foods containing healthful fats, such as  nuts, avocado, olive oil, canola oil, and fish.  Does everyone with diabetes mellitus have the same meal plan? Because every person with diabetes mellitus is different, there is not one meal plan that works for everyone. It is very important that you meet with a dietitian who will help you create a meal plan that is just right for you. This information is not intended to replace advice given to you by your health care provider. Make sure you discuss any questions you have with your health care provider. Document Released: 02/08/2005 Document Revised: 10/20/2015 Document Reviewed: 04/10/2013 Elsevier Interactive Patient Education  2017 [employer]  

## 2017-03-27 ENCOUNTER — Other Ambulatory Visit: Payer: Self-pay | Admitting: Family

## 2017-03-27 DIAGNOSIS — I824Y2 Acute embolism and thrombosis of unspecified deep veins of left proximal lower extremity: Secondary | ICD-10-CM

## 2017-05-17 ENCOUNTER — Ambulatory Visit (INDEPENDENT_AMBULATORY_CARE_PROVIDER_SITE_OTHER): Payer: Self-pay | Admitting: Family Medicine

## 2017-05-17 ENCOUNTER — Encounter: Payer: Self-pay | Admitting: Family Medicine

## 2017-05-17 VITALS — BP 134/72 | HR 68 | Temp 97.4°F | Ht 63.0 in | Wt 192.8 lb

## 2017-05-17 DIAGNOSIS — J0101 Acute recurrent maxillary sinusitis: Secondary | ICD-10-CM

## 2017-05-17 MED ORDER — AZITHROMYCIN 250 MG PO TABS: ORAL_TABLET | ORAL | 0 refills | Status: DC

## 2017-05-17 NOTE — Progress Notes (Signed)
BP 134/72   Pulse 68   Temp (!) 97.4 F (36.3 C) (Oral)   Ht 5\' 3"  (1.6 m)   Wt 192 lb 12.8 oz (87.5 kg)   BMI 34.15 kg/m    Subjective:    Patient ID: Holly Price, female    DOB: May 22, 1951, 66 y.o.   MRN: [id number]  HPI: Holly Price is a 66 y.o. female presenting on 05/17/2017 for Ear Pain; Facial Pain; and Nasal Congestion   HPI Ear pain and facial pain and nasal congestion Patient has been having ear pain and facial pain and nasal congestion that is been going on for the past 1 week.  She has been using her Flonase and allergy medication but it does not seem to be helping.  She denies any shortness of breath or wheezing.  She says the congestion and facial pain is getting worse and her drainage is getting worse and her cough is getting worse as well.  She says sometimes she gets like this where she just needs an antibiotic to get it treated.  She denies any sick contacts that she knows of.  Relevant past medical, surgical, family and social history reviewed and updated as indicated. Interim medical history since our last visit reviewed. Allergies and medications reviewed and updated.  Review of Systems  Constitutional: Negative for chills and fever.  HENT: Positive for congestion, ear pain, postnasal drip, rhinorrhea, sinus pressure, sinus pain, sneezing and sore throat. Negative for ear discharge.   Eyes: Negative for pain, redness and visual disturbance.  Respiratory: Positive for cough. Negative for chest tightness and shortness of breath.   Cardiovascular: Negative for chest pain and leg swelling.  Genitourinary: Negative for difficulty urinating and dysuria.  Musculoskeletal: Negative for back pain and gait problem.  Skin: Negative for rash.  Neurological: Negative for light-headedness and headaches.  Psychiatric/Behavioral: Negative for agitation and behavioral problems.  All other systems reviewed and are negative.   Per HPI unless specifically indicated  above        Objective:    BP 134/72   Pulse 68   Temp (!) 97.4 F (36.3 C) (Oral)   Ht 5\' 3"  (1.6 m)   Wt 192 lb 12.8 oz (87.5 kg)   BMI 34.15 kg/m   Wt Readings from Last 3 Encounters:  05/17/17 192 lb 12.8 oz (87.5 kg)  01/01/17 190 lb 9.6 oz (86.5 kg)  11/22/15 195 lb 3.2 oz (88.5 kg)    Physical Exam  Constitutional: She is oriented to person, place, and time. She appears well-developed and well-nourished. No distress.  HENT:  Right Ear: Tympanic membrane, external ear and ear canal normal.  Left Ear: Tympanic membrane, external ear and ear canal normal.  Nose: Mucosal edema and rhinorrhea present. No epistaxis. Right sinus exhibits maxillary sinus tenderness and frontal sinus tenderness. Left sinus exhibits maxillary sinus tenderness and frontal sinus tenderness.  Mouth/Throat: Uvula is midline and mucous membranes are normal. Posterior oropharyngeal edema and posterior oropharyngeal erythema present. No oropharyngeal exudate or tonsillar abscesses.  Eyes: Conjunctivae and EOM are normal.  Cardiovascular: Normal rate, regular rhythm, normal heart sounds and intact distal pulses.  No murmur heard. Pulmonary/Chest: Effort normal and breath sounds normal. No respiratory distress. She has no wheezes.  Musculoskeletal: Normal range of motion. She exhibits no edema or tenderness.  Neurological: She is alert and oriented to person, place, and time. Coordination normal.  Skin: Skin is warm and dry. No rash noted. She is not diaphoretic.  Psychiatric: She has a normal mood and affect. Her behavior is normal.  Vitals reviewed.       Assessment & Plan:   Problem List Items Addressed This Visit    None    Visit Diagnoses    Acute recurrent maxillary sinusitis    -  Primary   Relevant Medications   azithromycin (ZITHROMAX) 250 MG tablet       Follow up plan: Return if symptoms worsen or fail to improve.  Counseling provided for all of the vaccine components No orders  of the defined types were placed in this encounter.   Caryl Pina, MD [facility] 05/17/2017, 6:16 PM

## 2017-06-03 ENCOUNTER — Other Ambulatory Visit: Payer: Self-pay | Admitting: Family

## 2017-06-03 DIAGNOSIS — I824Y2 Acute embolism and thrombosis of unspecified deep veins of left proximal lower extremity: Secondary | ICD-10-CM

## 2017-07-04 ENCOUNTER — Ambulatory Visit: Payer: Self-pay | Admitting: Family

## 2017-07-05 ENCOUNTER — Ambulatory Visit (INDEPENDENT_AMBULATORY_CARE_PROVIDER_SITE_OTHER): Payer: Self-pay | Admitting: Family

## 2017-07-05 ENCOUNTER — Encounter: Payer: Self-pay | Admitting: Family

## 2017-07-05 VITALS — BP 157/84 | HR 63 | Temp 97.7°F | Ht 63.0 in | Wt 195.6 lb

## 2017-07-05 DIAGNOSIS — M549 Dorsalgia, unspecified: Secondary | ICD-10-CM

## 2017-07-05 DIAGNOSIS — E1159 Type 2 diabetes mellitus with other circulatory complications: Secondary | ICD-10-CM

## 2017-07-05 DIAGNOSIS — J0100 Acute maxillary sinusitis, unspecified: Secondary | ICD-10-CM

## 2017-07-05 DIAGNOSIS — E669 Obesity, unspecified: Secondary | ICD-10-CM

## 2017-07-05 DIAGNOSIS — E1169 Type 2 diabetes mellitus with other specified complication: Secondary | ICD-10-CM

## 2017-07-05 DIAGNOSIS — I152 Hypertension secondary to endocrine disorders: Secondary | ICD-10-CM

## 2017-07-05 DIAGNOSIS — E0800 Diabetes mellitus due to underlying condition with hyperosmolarity without nonketotic hyperglycemic-hyperosmolar coma (NKHHC): Secondary | ICD-10-CM

## 2017-07-05 DIAGNOSIS — E785 Hyperlipidemia, unspecified: Secondary | ICD-10-CM

## 2017-07-05 DIAGNOSIS — I10 Essential (primary) hypertension: Secondary | ICD-10-CM

## 2017-07-05 LAB — BAYER DCA HB A1C WAIVED: HB A1C (BAYER DCA - WAIVED): 6 % (ref <7.0)

## 2017-07-05 MED ORDER — AMOXICILLIN-POT CLAVULANATE 875-125 MG PO TABS: 1.0000 | ORAL_TABLET | Freq: Two times a day (BID) | ORAL | 0 refills | Status: DC

## 2017-07-05 NOTE — Patient Instructions (Signed)

## 2017-07-05 NOTE — Progress Notes (Signed)
Subjective:    Patient ID: Holly Price, female    DOB: 11/14/1950, 67 y.o.   MRN: [id number]  Pt presents to the office today for chronic follow up. Pt is self pay at this time.  Diabetes  She presents for her follow-up diabetic visit. She has type 2 diabetes mellitus. Her disease course has been stable. Hypoglycemia symptoms include headaches. There are no diabetic associated symptoms. Pertinent negatives for diabetes include no blurred vision, no foot paresthesias and no visual change. There are no hypoglycemic complications. Symptoms are stable. Pertinent negatives for diabetic complications include no CVA, heart disease or nephropathy. Risk factors for coronary artery disease include diabetes mellitus, dyslipidemia, obesity, hypertension and sedentary lifestyle.  Hypertension  This is a chronic problem. The current episode started more than 1 year ago. The problem has been waxing and waning since onset. The problem is uncontrolled. Associated symptoms include headaches. Pertinent negatives include no blurred vision, peripheral edema or shortness of breath. Risk factors for coronary artery disease include dyslipidemia, diabetes mellitus and obesity. The current treatment provides mild improvement. There is no history of CVA.  Hyperlipidemia  This is a chronic problem. The current episode started more than 1 year ago. The problem is controlled. Recent lipid tests were reviewed and are normal. Exacerbating diseases include obesity. Pertinent negatives include no shortness of breath. Current antihyperlipidemic treatment includes statins. The current treatment provides moderate improvement of lipids. Risk factors for coronary artery disease include diabetes mellitus, dyslipidemia and obesity.  Arthritis  Presents for follow-up visit. She complains of pain and stiffness. The symptoms have been stable. Affected locations include the right hip and left hip (back). Her pain is at a severity of 2/10.    Sinusitis  This is a recurrent problem. The current episode started 1 to 4 weeks ago. The problem has been waxing and waning since onset. There has been no fever. Her pain is at a severity of 4/10. The pain is moderate. Associated symptoms include congestion, ear pain, headaches, sinus pressure and sneezing. Pertinent negatives include no coughing, shortness of breath or sore throat. Past treatments include oral decongestants. The treatment provided mild relief.      Review of Systems  HENT: Positive for congestion, ear pain, sinus pressure and sneezing. Negative for sore throat.   Eyes: Negative for blurred vision.  Respiratory: Negative for cough and shortness of breath.   Musculoskeletal: Positive for arthritis and stiffness.  Neurological: Positive for headaches.  All other systems reviewed and are negative.      Objective:   Physical Exam  Constitutional: She is oriented to person, place, and time. She appears well-developed and well-nourished. No distress.  HENT:  Head: Normocephalic and atraumatic.  Right Ear: External ear normal.  Left Ear: External ear normal.  Nose: Nose normal.  Mouth/Throat: Oropharynx is clear and moist.  Eyes: Pupils are equal, round, and reactive to light.  Neck: Normal range of motion. Neck supple. No thyromegaly present.  Cardiovascular: Normal rate, regular rhythm, normal heart sounds and intact distal pulses.  No murmur heard. Pulmonary/Chest: Effort normal and breath sounds normal. No respiratory distress. She has no wheezes.  Abdominal: Soft. Bowel sounds are normal. She exhibits no distension. There is no tenderness.  Musculoskeletal: Normal range of motion. She exhibits no edema or tenderness.  Neurological: She is alert and oriented to person, place, and time.  Skin: Skin is warm and dry.  Psychiatric: She has a normal mood and affect. Her behavior is normal. Judgment and  thought content normal.  Vitals reviewed.    BP (!) 157/84    Pulse 63   Temp 97.7 F (36.5 C) (Oral)   Ht 5' 3"  (1.6 m)   Wt 195 lb 9.6 oz (88.7 kg)   BMI 34.65 kg/m      Assessment & Plan:  1. Hypertension associated with diabetes ([place]) - CMP14+EGFR  2. Diabetes mellitus due to underlying condition with hyperosmolarity without coma, without long-term current use of insulin ([facility]) - Bayer DCA Hb A1c Waived - CMP14+EGFR  3. Mid back pain - CMP14+EGFR  4. Obesity (BMI 30-39.9) - CMP14+EGFR  5. Hyperlipidemia associated with type 2 diabetes mellitus ([facility]) - CMP14+EGFR  6. Acute non-recurrent maxillary sinusitis - Take meds as prescribed - Use a cool mist humidifier  -Force fluids -For any cough or congestion  Use plain Mucinex- regular strength or max strength is fine -For fever or aces or pains- take tylenol or ibuprofen appropriate for age and weight. -Throat lozenges if help -New toothbrush in 3 days - CMP14+EGFR - amoxicillin-clavulanate (AUGMENTIN) 875-125 MG tablet; Take 1 tablet by mouth 2 (two) times daily.  Dispense: 14 tablet; Refill: 0   Continue all meds Labs pending Health Maintenance reviewed- PT refuses all of these at this time since she is self pay Diet and exercise encouraged RTO 6 months   Evelina Dun, FNP

## 2017-07-06 LAB — CMP14+EGFR
ALK PHOS: 45 IU/L (ref 39–117)
ALT: 14 IU/L (ref 0–32)
AST: 18 IU/L (ref 0–40)
Albumin/Globulin Ratio: 1.7 (ref 1.2–2.2)
Albumin: 4.3 g/dL (ref 3.6–4.8)
BILIRUBIN TOTAL: 0.6 mg/dL (ref 0.0–1.2)
BUN / CREAT RATIO: 15 (ref 12–28)
BUN: 14 mg/dL (ref 8–27)
CHLORIDE: 101 mmol/L (ref 96–106)
CO2: 26 mmol/L (ref 20–29)
Calcium: 9.5 mg/dL (ref 8.7–10.3)
Creatinine, Ser: 0.94 mg/dL (ref 0.57–1.00)
GFR calc Af Amer: 74 mL/min/{1.73_m2} (ref >59)
GFR calc non Af Amer: 64 mL/min/{1.73_m2} (ref >59)
GLUCOSE: 80 mg/dL (ref 65–99)
Globulin, Total: 2.5 g/dL (ref 1.5–4.5)
POTASSIUM: 4.3 mmol/L (ref 3.5–5.2)
Sodium: 143 mmol/L (ref 134–144)
Total Protein: 6.8 g/dL (ref 6.0–8.5)

## 2017-08-30 ENCOUNTER — Other Ambulatory Visit: Payer: Self-pay | Admitting: Family

## 2017-08-30 DIAGNOSIS — I824Y2 Acute embolism and thrombosis of unspecified deep veins of left proximal lower extremity: Secondary | ICD-10-CM

## 2017-09-27 ENCOUNTER — Other Ambulatory Visit: Payer: Self-pay | Admitting: Family

## 2017-09-27 DIAGNOSIS — I824Y2 Acute embolism and thrombosis of unspecified deep veins of left proximal lower extremity: Secondary | ICD-10-CM

## 2018-01-13 IMAGING — US US EXTREM LOW VENOUS*L*
1 series · 13 of 24 positions shown · non-contrast
Comparison: None.

CLINICAL DATA: 64-year-old female with a history of left calf pain



[Series 1: us extrem low venous*left* · 0.08mm/px · 13 of 39 slices shown]
[im 1/39]
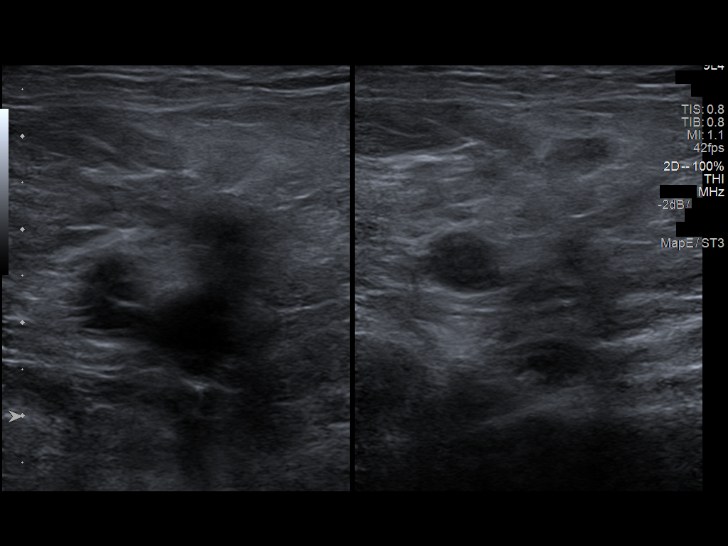
[im 4/39]
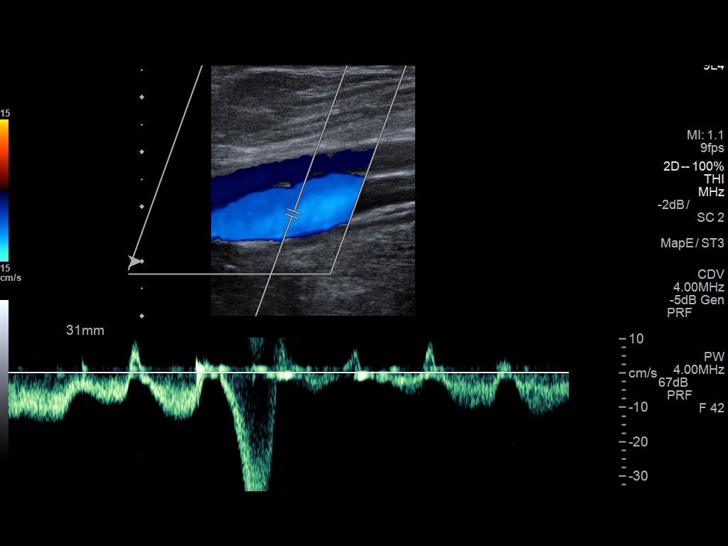
[im 7/39]
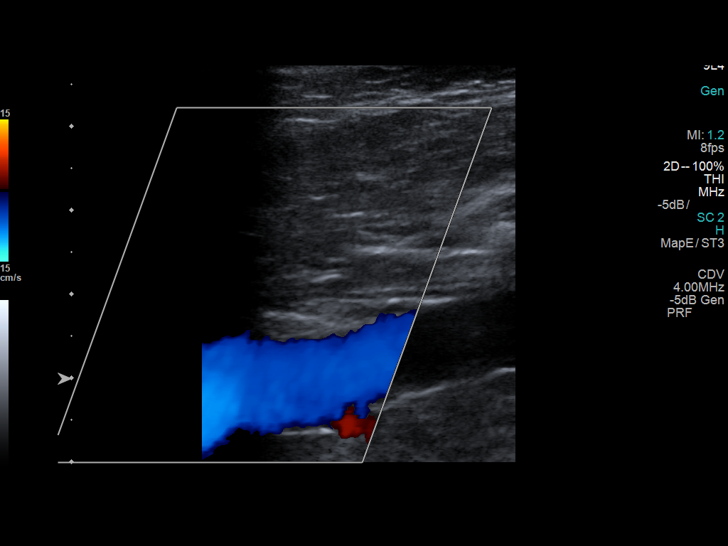
[im 10/39]
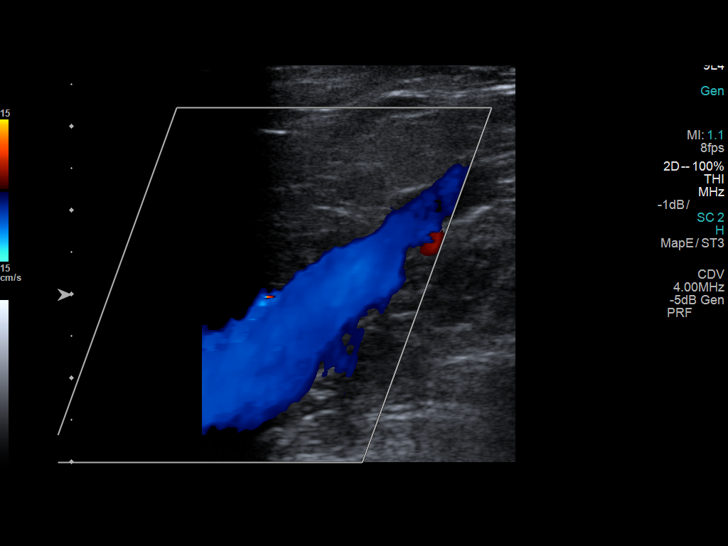
[im 14/39]
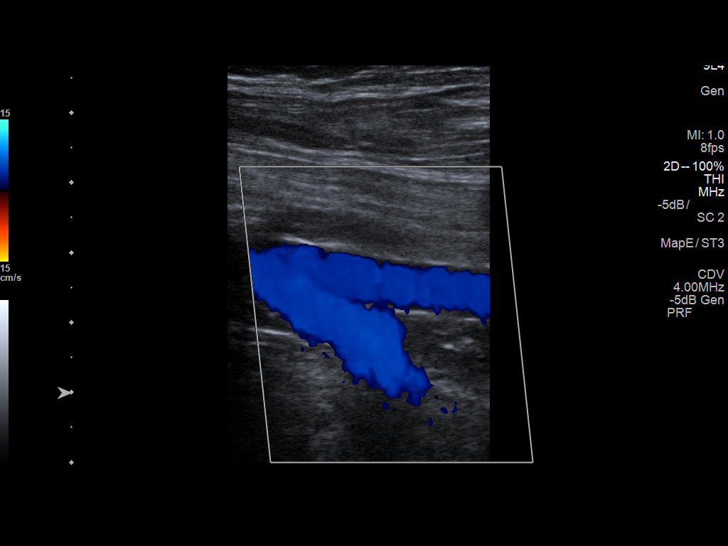
[im 17/39]
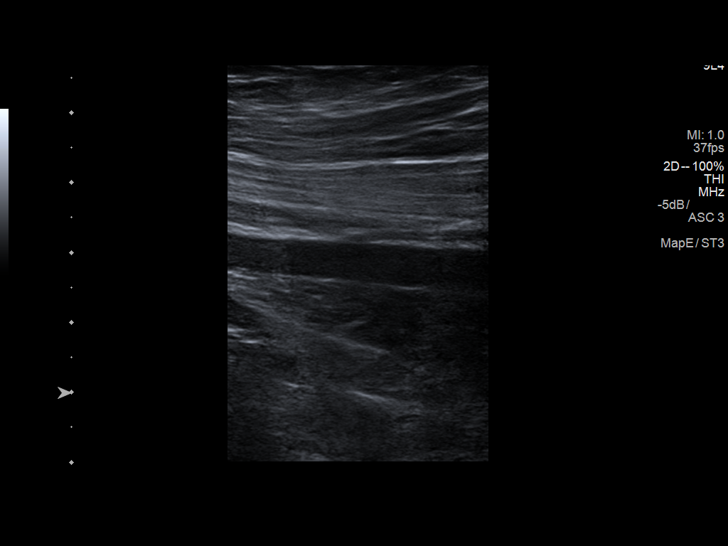
[im 20/39]
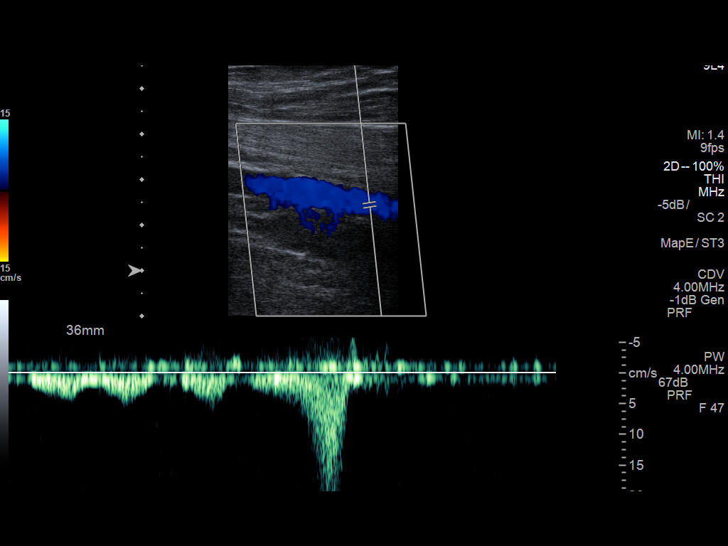
[im 22/39]
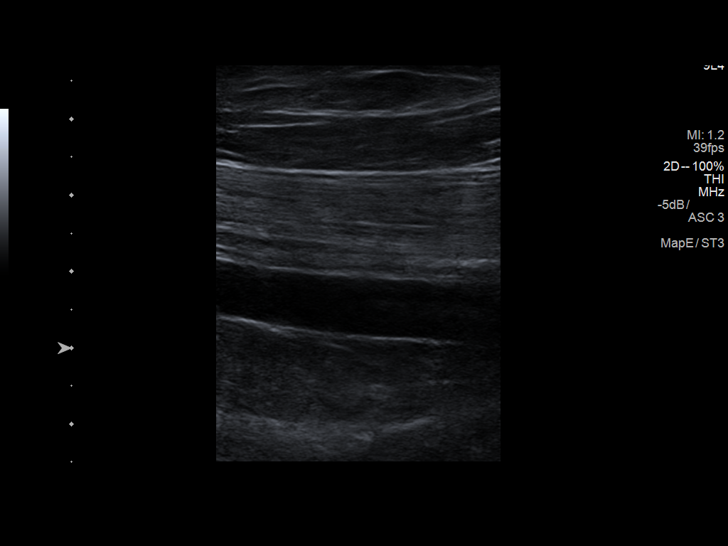
[im 25/39]
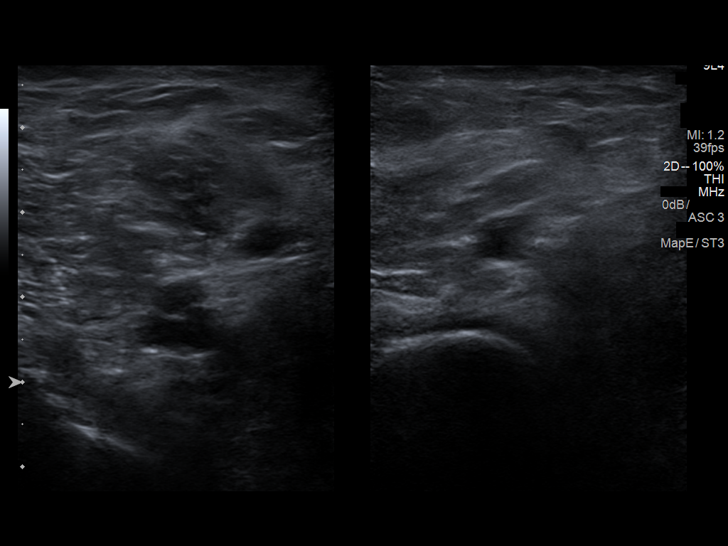
[im 29/39]
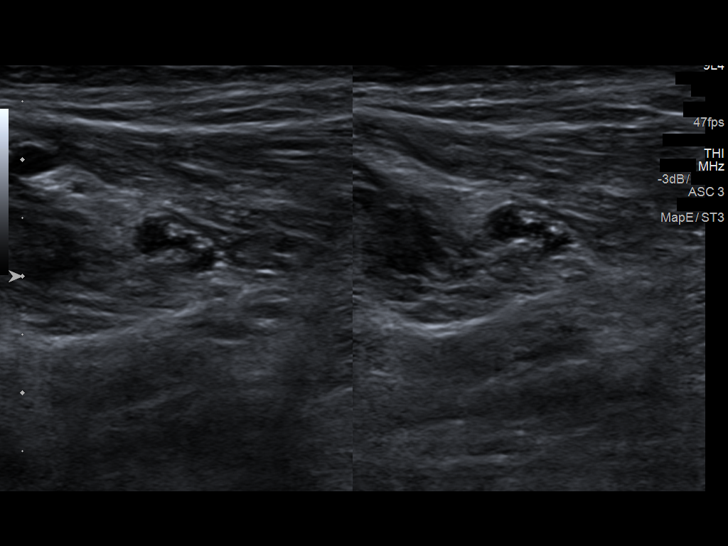
[im 32/39]
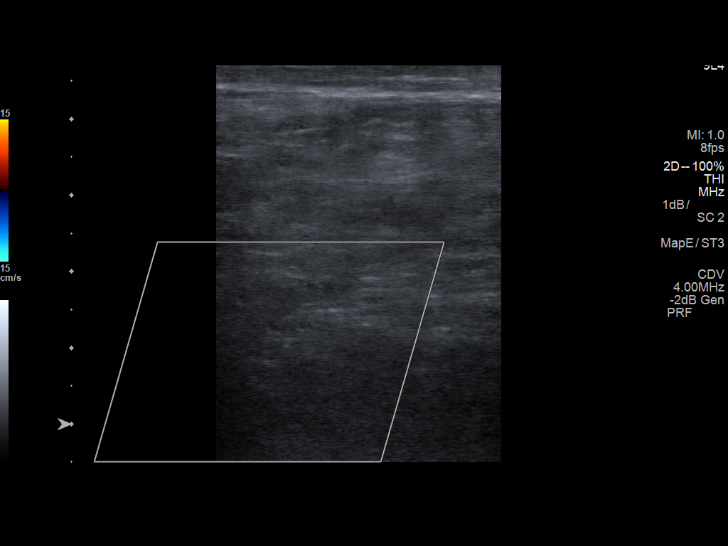
[im 35/39]
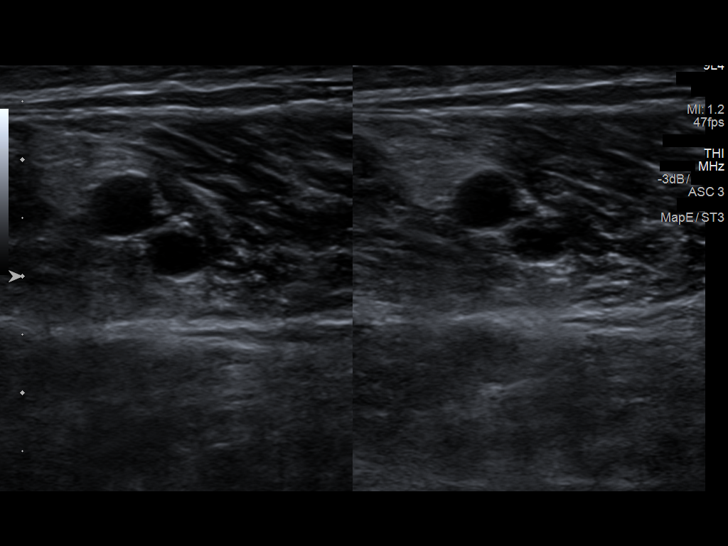
[im 39/39]
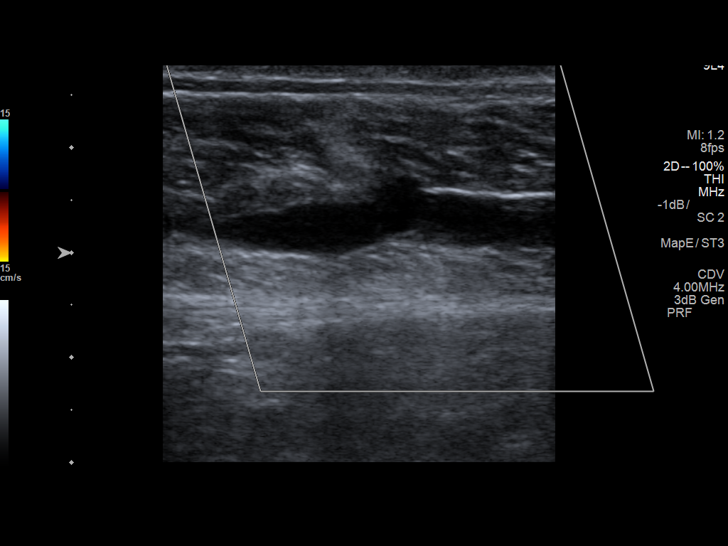

[13 of 24 positions shown; findings below may reference images not displayed]

FINDINGS: Contralateral Common Femoral Vein: Respiratory phasicity is normal
and symmetric with the symptomatic side. No evidence of thrombus.
Normal compressibility.

Common Femoral Vein: No evidence of thrombus. Normal
compressibility, respiratory phasicity and response to augmentation.

Saphenofemoral Junction: No evidence of thrombus. Normal
compressibility and flow on color Doppler imaging.

Profunda Femoral Vein: No evidence of thrombus. Normal
compressibility and flow on color Doppler imaging.

Femoral Vein: No evidence of thrombus. Normal compressibility,
respiratory phasicity and response to augmentation.

Popliteal Vein: No evidence of thrombus. Normal compressibility,
respiratory phasicity and response to augmentation.

Calf Veins: Occlusive thrombus of the left intramuscular
gastrocnemius vein without extension into the popliteal vein.

Superficial Great Saphenous Vein: No evidence of thrombus. Normal
compressibility and flow on color Doppler imaging.

Other Findings:  None.
IMPRESSION: Sonographic survey of the left lower extremity is positive for acute
thrombus of the intramuscular gastroc vein, without extension into
the popliteal vein.

Study is negative for left common femoral, femoral, and popliteal
DVT.

## 2018-05-31 ENCOUNTER — Other Ambulatory Visit: Payer: Self-pay | Admitting: Family

## 2018-05-31 DIAGNOSIS — I824Y2 Acute embolism and thrombosis of unspecified deep veins of left proximal lower extremity: Secondary | ICD-10-CM

## 2018-06-06 ENCOUNTER — Other Ambulatory Visit: Payer: Self-pay | Admitting: Family

## 2018-06-06 DIAGNOSIS — M25511 Pain in right shoulder: Secondary | ICD-10-CM

## 2018-06-06 DIAGNOSIS — M549 Dorsalgia, unspecified: Secondary | ICD-10-CM

## 2018-06-16 ENCOUNTER — Encounter: Payer: Self-pay | Admitting: Family

## 2018-06-16 ENCOUNTER — Telehealth: Payer: Self-pay | Admitting: Family

## 2018-06-16 ENCOUNTER — Other Ambulatory Visit (HOSPITAL_COMMUNITY)
Admission: RE | Admit: 2018-06-16 | Discharge: 2018-06-16 | Disposition: A | Discharge status: Home / Self Care | Payer: Medicare Other | Source: Ambulatory Visit | Attending: Family | Admitting: Family

## 2018-06-16 ENCOUNTER — Ambulatory Visit (INDEPENDENT_AMBULATORY_CARE_PROVIDER_SITE_OTHER): Payer: Medicare Other | Admitting: Family

## 2018-06-16 VITALS — BP 158/79 | HR 72 | Temp 97.5°F | Ht 63.0 in | Wt 192.2 lb

## 2018-06-16 DIAGNOSIS — Z23 Encounter for immunization: Secondary | ICD-10-CM

## 2018-06-16 DIAGNOSIS — E785 Hyperlipidemia, unspecified: Secondary | ICD-10-CM

## 2018-06-16 DIAGNOSIS — E1159 Type 2 diabetes mellitus with other circulatory complications: Secondary | ICD-10-CM | POA: Insufficient documentation

## 2018-06-16 DIAGNOSIS — E1100 Type 2 diabetes mellitus with hyperosmolarity without nonketotic hyperglycemic-hyperosmolar coma (NKHHC): Secondary | ICD-10-CM | POA: Diagnosis not present

## 2018-06-16 DIAGNOSIS — Z780 Asymptomatic menopausal state: Secondary | ICD-10-CM | POA: Insufficient documentation

## 2018-06-16 DIAGNOSIS — E669 Obesity, unspecified: Secondary | ICD-10-CM | POA: Diagnosis not present

## 2018-06-16 DIAGNOSIS — I10 Essential (primary) hypertension: Secondary | ICD-10-CM

## 2018-06-16 DIAGNOSIS — E1142 Type 2 diabetes mellitus with diabetic polyneuropathy: Secondary | ICD-10-CM | POA: Diagnosis not present

## 2018-06-16 DIAGNOSIS — E0800 Diabetes mellitus due to underlying condition with hyperosmolarity without nonketotic hyperglycemic-hyperosmolar coma (NKHHC): Secondary | ICD-10-CM

## 2018-06-16 DIAGNOSIS — M549 Dorsalgia, unspecified: Secondary | ICD-10-CM | POA: Diagnosis not present

## 2018-06-16 DIAGNOSIS — Z0000 Encounter for general adult medical examination without abnormal findings: Secondary | ICD-10-CM | POA: Insufficient documentation

## 2018-06-16 DIAGNOSIS — E559 Vitamin D deficiency, unspecified: Secondary | ICD-10-CM | POA: Diagnosis not present

## 2018-06-16 DIAGNOSIS — E1169 Type 2 diabetes mellitus with other specified complication: Secondary | ICD-10-CM | POA: Insufficient documentation

## 2018-06-16 DIAGNOSIS — Z86718 Personal history of other venous thrombosis and embolism: Secondary | ICD-10-CM

## 2018-06-16 DIAGNOSIS — I152 Hypertension secondary to endocrine disorders: Secondary | ICD-10-CM

## 2018-06-16 DIAGNOSIS — Z0001 Encounter for general adult medical examination with abnormal findings: Secondary | ICD-10-CM | POA: Diagnosis not present

## 2018-06-16 LAB — COMPREHENSIVE METABOLIC PANEL
ALT: 17 U/L (ref 0–44)
AST: 19 U/L (ref 15–41)
Albumin: 4 g/dL (ref 3.5–5.0)
Alkaline Phosphatase: 31 U/L — ABNORMAL LOW (ref 38–126)
Anion gap: 10 (ref 5–15)
BILIRUBIN TOTAL: 0.2 mg/dL — AB (ref 0.3–1.2)
BUN: 21 mg/dL (ref 8–23)
CO2: 29 mmol/L (ref 22–32)
Calcium: 9.3 mg/dL (ref 8.9–10.3)
Chloride: 100 mmol/L (ref 98–111)
Creatinine, Ser: 0.81 mg/dL (ref 0.44–1.00)
GFR calc Af Amer: >60 mL/min (ref >60)
GFR calc non Af Amer: >60 mL/min (ref >60)
Glucose, Bld: 82 mg/dL (ref 70–99)
Potassium: 3.8 mmol/L (ref 3.5–5.1)
Sodium: 139 mmol/L (ref 135–145)
Total Protein: 7 g/dL (ref 6.5–8.1)

## 2018-06-16 LAB — CBC WITH DIFFERENTIAL/PLATELET
Abs Immature Granulocytes: 0.03 10*3/uL (ref 0.00–0.07)
Basophils Absolute: 0.1 10*3/uL (ref 0.0–0.1)
Basophils Relative: 1 %
Eosinophils Absolute: 0.2 10*3/uL (ref 0.0–0.5)
Eosinophils Relative: 2 %
HEMATOCRIT: 45.8 % (ref 36.0–46.0)
Hemoglobin: 15 g/dL (ref 12.0–15.0)
Immature Granulocytes: 0 %
Lymphocytes Relative: 30 %
Lymphs Abs: 2.9 10*3/uL (ref 0.7–4.0)
MCH: 32.2 pg (ref 26.0–34.0)
MCHC: 32.8 g/dL (ref 30.0–36.0)
MCV: 98.3 fL (ref 80.0–100.0)
Monocytes Absolute: 1 10*3/uL (ref 0.1–1.0)
Monocytes Relative: 10 %
Neutro Abs: 5.6 10*3/uL (ref 1.7–7.7)
Neutrophils Relative %: 57 %
Platelets: 233 10*3/uL (ref 150–400)
RBC: 4.66 MIL/uL (ref 3.87–5.11)
RDW: 12.7 % (ref 11.5–15.5)
WBC: 9.7 10*3/uL (ref 4.0–10.5)
nRBC: 0 % (ref 0.0–0.2)

## 2018-06-16 LAB — LIPID PANEL
Cholesterol: 149 mg/dL (ref 0–200)
HDL: 50 mg/dL (ref >40)
LDL Cholesterol: 63 mg/dL (ref 0–99)
Total CHOL/HDL Ratio: 3 RATIO
Triglycerides: 182 mg/dL — ABNORMAL HIGH (ref <150)
VLDL: 36 mg/dL (ref 0–40)

## 2018-06-16 LAB — TSH: TSH: 1.102 u[IU]/mL (ref 0.350–4.500)

## 2018-06-16 LAB — BAYER DCA HB A1C WAIVED: HB A1C (BAYER DCA - WAIVED): 6.2 % (ref <7.0)

## 2018-06-16 MED ORDER — AMLODIPINE BESYLATE 5 MG PO TABS: 5.0000 mg | ORAL_TABLET | Freq: Every day | ORAL | 3 refills | Status: DC

## 2018-06-16 NOTE — Progress Notes (Signed)
Subjective:    Patient ID: Holly Price, female    DOB: [birth year], 68 y.o.   MRN: [id number]  Chief Complaint  Patient presents with  . Annual Exam   PT presents to the office today for CPE.  Diabetes  She presents for her follow-up diabetic visit. She has type 2 diabetes mellitus. Her disease course has been stable. There are no hypoglycemic associated symptoms. Pertinent negatives for diabetes include no blurred vision, no foot paresthesias and no visual change. Symptoms are stable. Diabetic complications include peripheral neuropathy. Pertinent negatives for diabetic complications include no CVA or heart disease. Risk factors for coronary artery disease include dyslipidemia, diabetes mellitus and hypertension. She is following a generally healthy diet. Her overall blood glucose range is 110-130 mg/dl. Eye exam is not current.  Hypertension  This is a chronic problem. The current episode started more than 1 year ago. The problem has been waxing and waning since onset. The problem is uncontrolled. Pertinent negatives include no blurred vision, malaise/fatigue, peripheral edema or shortness of breath. Risk factors for coronary artery disease include dyslipidemia, obesity and stress. The current treatment provides mild improvement. There is no history of CVA.  Hyperlipidemia  This is a chronic problem. The current episode started more than 1 year ago. The problem is controlled. Recent lipid tests were reviewed and are normal. Exacerbating diseases include obesity. Pertinent negatives include no shortness of breath. The current treatment provides no improvement of lipids. Risk factors for coronary artery disease include diabetes mellitus, dyslipidemia and hypertension.  Back Pain  This is a chronic problem. The current episode started more than 1 year ago. The problem occurs intermittently. The problem has been waxing and waning since onset. The pain is present in the lumbar spine. The quality  of the pain is described as aching. The pain is mild. Pertinent negatives include no bladder incontinence, bowel incontinence or perianal numbness.  Diabetic Neuropathy PT complaining of burning and aching pain of 4-5 out 10.  HX DVT PT takes Xarelto 20 mg daily.    Review of Systems  Constitutional: Negative for malaise/fatigue.  Eyes: Negative for blurred vision.  Respiratory: Negative for shortness of breath.   Gastrointestinal: Negative for bowel incontinence.  Genitourinary: Negative for bladder incontinence.  Musculoskeletal: Positive for back pain.  All other systems reviewed and are negative.      Objective:   Physical Exam Vitals signs reviewed.  Constitutional:      General: She is not in acute distress.    Appearance: She is well-developed.  HENT:     Head: Normocephalic and atraumatic.     Right Ear: Tympanic membrane normal.     Left Ear: Tympanic membrane normal.  Eyes:     Pupils: Pupils are equal, round, and reactive to light.  Neck:     Musculoskeletal: Normal range of motion and neck supple.     Thyroid: No thyromegaly.  Cardiovascular:     Rate and Rhythm: Normal rate and regular rhythm.     Heart sounds: Normal heart sounds. No murmur.  Pulmonary:     Effort: Pulmonary effort is normal. No respiratory distress.     Breath sounds: Normal breath sounds. No wheezing.  Abdominal:     General: Bowel sounds are normal. There is no distension.     Palpations: Abdomen is soft.     Tenderness: There is no abdominal tenderness.  Musculoskeletal: Normal range of motion.        General: No tenderness.  Skin:  General: Skin is warm and dry.  Neurological:     Mental Status: She is alert and oriented to person, place, and time.     Cranial Nerves: No cranial nerve deficit.     Deep Tendon Reflexes: Reflexes are normal and symmetric.  Psychiatric:        Behavior: Behavior normal.        Thought Content: Thought content normal.        Judgment: Judgment  normal.       BP (!) 160/72   Pulse 72   Temp (!) 97.5 F (36.4 C) (Oral)   Ht 5' 3"  (1.6 m)   Wt 192 lb 3.2 oz (87.2 kg)   BMI 34.05 kg/m      Assessment & Plan:  Holly Price comes in today with chief complaint of Annual Exam   Diagnosis and orders addressed:  1. Annual physical exam - Bayer DCA Hb A1c Waived - CBC with Differential/Platelet - CMP14+EGFR - Lipid panel - TSH  2. Hypertension associated with diabetes ([place]) We will start Norvasc 5 mg today -Dash diet information given -Exercise encouraged - Stress Management  -Continue current meds -RTO in 2 weeks  - CBC with Differential/Platelet - CMP14+EGFR - amLODipine (NORVASC) 5 MG tablet; Take 1 tablet (5 mg total) by mouth daily.  Dispense: 90 tablet; Refill: 3  3. Diabetes mellitus due to underlying condition with hyperosmolarity without coma, without long-term current use of insulin ([facility]) - Bayer DCA Hb A1c Waived - CBC with Differential/Platelet - CMP14+EGFR - Microalbumin / creatinine urine ratio  4. Hyperlipidemia associated with type 2 diabetes mellitus ([place]) - CBC with Differential/Platelet - CMP14+EGFR - Lipid panel  5. Diabetic polyneuropathy associated with type 2 diabetes mellitus ([place]) - CBC with Differential/Platelet - CMP14+EGFR  6. Obesity (BMI 30-39.9) - CBC with Differential/Platelet - CMP14+EGFR  7. Vitamin D deficiency - CBC with Differential/Platelet - CMP14+EGFR - VITAMIN D 25 Hydroxy (Vit-D Deficiency, Fractures)  8. Encounter for immunization - Flu vaccine HIGH DOSE PF  9. Mid back pain - CBC with Differential/Platelet - CMP14+EGFR  10. Post-menopause - CBC with Differential/Platelet - CMP14+EGFR - DG [facility] DEXA  11. History of DVT (deep vein thrombosis) - Ambulatory referral to Hematology   Labs pending Health Maintenance reviewed Diet and exercise encouraged  Follow up plan: 2 weeks to recheck HTN   Holly Dun, FNP

## 2018-06-16 NOTE — Addendum Note (Signed)
Addended by: Shelbie Ammons on: 06/16/2018 11:05 AM   Modules accepted: Orders

## 2018-06-16 NOTE — Patient Instructions (Signed)

## 2018-06-17 LAB — CBC WITH DIFFERENTIAL/PLATELET
BASOS: 1 %
Basophils Absolute: 0.1 10*3/uL (ref 0.0–0.2)
EOS (ABSOLUTE): 0.2 10*3/uL (ref 0.0–0.4)
Eos: 2 %
Hematocrit: 43.4 % (ref 34.0–46.6)
Hemoglobin: 15 g/dL (ref 11.1–15.9)
IMMATURE GRANULOCYTES: 0 %
Immature Grans (Abs): 0 10*3/uL (ref 0.0–0.1)
Lymphocytes Absolute: 2.3 10*3/uL (ref 0.7–3.1)
Lymphs: 29 %
MCH: 32.2 pg (ref 26.6–33.0)
MCHC: 34.6 g/dL (ref 31.5–35.7)
MCV: 93 fL (ref 79–97)
Monocytes Absolute: 0.5 10*3/uL (ref 0.1–0.9)
Monocytes: 6 %
NEUTROS PCT: 62 %
Neutrophils Absolute: 5.1 10*3/uL (ref 1.4–7.0)
Platelets: 249 10*3/uL (ref 150–450)
RBC: 4.66 x10E6/uL (ref 3.77–5.28)
RDW: 12.3 % (ref 11.7–15.4)
WBC: 8.1 10*3/uL (ref 3.4–10.8)

## 2018-06-17 LAB — CMP14+EGFR
ALT: 15 IU/L (ref 0–32)
AST: 14 IU/L (ref 0–40)
Albumin/Globulin Ratio: 1.7 (ref 1.2–2.2)
Albumin: 4 g/dL (ref 3.8–4.8)
Alkaline Phosphatase: 41 IU/L (ref 39–117)
BUN/Creatinine Ratio: 19 (ref 12–28)
BUN: 18 mg/dL (ref 8–27)
Bilirubin Total: 0.4 mg/dL (ref 0.0–1.2)
CALCIUM: 9.4 mg/dL (ref 8.7–10.3)
CO2: 26 mmol/L (ref 20–29)
Chloride: 100 mmol/L (ref 96–106)
Creatinine, Ser: 0.94 mg/dL (ref 0.57–1.00)
GFR calc Af Amer: 74 mL/min/{1.73_m2} (ref >59)
GFR, EST NON AFRICAN AMERICAN: 64 mL/min/{1.73_m2} (ref >59)
Globulin, Total: 2.3 g/dL (ref 1.5–4.5)
Glucose: 186 mg/dL — ABNORMAL HIGH (ref 65–99)
Potassium: 4.2 mmol/L (ref 3.5–5.2)
Sodium: 143 mmol/L (ref 134–144)
Total Protein: 6.3 g/dL (ref 6.0–8.5)

## 2018-06-17 LAB — VITAMIN D 25 HYDROXY (VIT D DEFICIENCY, FRACTURES)
Vit D, 25-Hydroxy: 82.3 ng/mL (ref 30.0–100.0)
Vit D, 25-Hydroxy: 83.7 ng/mL (ref 30.0–100.0)

## 2018-06-17 LAB — MICROALBUMIN / CREATININE URINE RATIO
Creatinine, Urine: 87.7 mg/dL
Microalb Creat Ratio: <3 mg/g creat (ref 0–29)
Microalb, Ur: <3 ug/mL — ABNORMAL HIGH

## 2018-06-17 LAB — LIPID PANEL
Chol/HDL Ratio: 2.9 ratio (ref 0.0–4.4)
Cholesterol, Total: 150 mg/dL (ref 100–199)
HDL: 52 mg/dL (ref >39)
LDL Calculated: 65 mg/dL (ref 0–99)
Triglycerides: 165 mg/dL — ABNORMAL HIGH (ref 0–149)
VLDL Cholesterol Cal: 33 mg/dL (ref 5–40)

## 2018-06-17 LAB — TSH: TSH: 0.91 u[IU]/mL (ref 0.450–4.500)

## 2018-06-23 ENCOUNTER — Ambulatory Visit (INDEPENDENT_AMBULATORY_CARE_PROVIDER_SITE_OTHER): Payer: Medicare Other

## 2018-06-23 DIAGNOSIS — Z13820 Encounter for screening for osteoporosis: Secondary | ICD-10-CM | POA: Diagnosis not present

## 2018-06-23 DIAGNOSIS — Z780 Asymptomatic menopausal state: Secondary | ICD-10-CM | POA: Diagnosis not present

## 2018-06-24 ENCOUNTER — Inpatient Hospital Stay (HOSPITAL_COMMUNITY): Payer: Medicare Other | Attending: Internal Medicine | Admitting: Internal Medicine

## 2018-06-24 ENCOUNTER — Other Ambulatory Visit: Payer: Self-pay

## 2018-06-24 ENCOUNTER — Encounter (HOSPITAL_COMMUNITY): Payer: Self-pay | Admitting: Internal Medicine

## 2018-06-24 ENCOUNTER — Inpatient Hospital Stay (HOSPITAL_COMMUNITY): Payer: Medicare Other

## 2018-06-24 VITALS — BP 140/66 | HR 66 | Temp 98.0°F | Resp 16 | Ht 62.5 in | Wt 189.2 lb

## 2018-06-24 DIAGNOSIS — Z87891 Personal history of nicotine dependence: Secondary | ICD-10-CM | POA: Diagnosis not present

## 2018-06-24 DIAGNOSIS — E119 Type 2 diabetes mellitus without complications: Secondary | ICD-10-CM | POA: Diagnosis not present

## 2018-06-24 DIAGNOSIS — Z7984 Long term (current) use of oral hypoglycemic drugs: Secondary | ICD-10-CM | POA: Diagnosis not present

## 2018-06-24 DIAGNOSIS — Z8249 Family history of ischemic heart disease and other diseases of the circulatory system: Secondary | ICD-10-CM | POA: Diagnosis not present

## 2018-06-24 DIAGNOSIS — Z79899 Other long term (current) drug therapy: Secondary | ICD-10-CM | POA: Insufficient documentation

## 2018-06-24 DIAGNOSIS — Z7901 Long term (current) use of anticoagulants: Secondary | ICD-10-CM | POA: Insufficient documentation

## 2018-06-24 DIAGNOSIS — I10 Essential (primary) hypertension: Secondary | ICD-10-CM | POA: Diagnosis not present

## 2018-06-24 DIAGNOSIS — Z720 Tobacco use: Secondary | ICD-10-CM

## 2018-06-24 DIAGNOSIS — Z791 Long term (current) use of non-steroidal anti-inflammatories (NSAID): Secondary | ICD-10-CM | POA: Insufficient documentation

## 2018-06-24 DIAGNOSIS — R634 Abnormal weight loss: Secondary | ICD-10-CM

## 2018-06-24 DIAGNOSIS — Z86718 Personal history of other venous thrombosis and embolism: Secondary | ICD-10-CM | POA: Diagnosis not present

## 2018-06-24 NOTE — Progress Notes (Signed)
Referring Physician:  Evelina Dun, NP  [facility]  Diagnosis History of DVT (deep vein thrombosis) - Plan: Factor 5 leiden, Prothrombin gene mutation, Beta-2-glycoprotein i abs, IgG/M/A, Lupus anticoagulant panel, ANA, IFA (with reflex), CT CHEST W CONTRAST, US Venous Img Lower Bilateral  Tobacco use - Plan: Factor 5 leiden, Prothrombin gene mutation, Beta-2-glycoprotein i abs, IgG/M/A, Lupus anticoagulant panel, ANA, IFA (with reflex), CT CHEST W CONTRAST, CT ABDOMEN PELVIS W CONTRAST, US Venous Img Lower Bilateral  Abnormal weight loss - Plan: Factor 5 leiden, Prothrombin gene mutation, Beta-2-glycoprotein i abs, IgG/M/A, Lupus anticoagulant panel, ANA, IFA (with reflex), CT CHEST W CONTRAST, CT ABDOMEN PELVIS W CONTRAST, US Venous Img Lower Bilateral  Staging Cancer Staging No matching staging information was found for the patient.  Assessment and Plan:  1.  Left LE DVT.  68 year old female referred for evaluation due to being diagnosed with DVT in 07/2015 when she had USN done 08/10/2015 that showed  IMPRESSION: Sonographic survey of the left lower extremity is positive for acute thrombus of the intramuscular gastroc vein, without extension into the popliteal vein.  Study is negative for left common femoral, femoral, and popliteal DVT.  She has been on Xarelto since that time.  She denies any bruising or bleeding.  She denies any family history of clotting disorders.  Pt denies any prior clot.  She denies surgery at time of event, hormonal therapy or extensive travel.  She reports she is active.  She denies any cancer history.  She has never undergone colonoscopy.  She had mammograms in the past.  Pt is seen today for consultation due to DVT.    Long talk held with pt.  I discussed with her unprovoked DVTs are usually recommended for 6 months of therapy.   Pt has been on anticoagulation since 2017.   She will undergo hypercoagulable work-up.  She will be set up  for CT of CAP for evaluation of any occult malignancy.  She will also undergo bilateral LE doppler for interval follow-up.  Pt will follow-up to go over lab and scan results.    2  HTN.  BP is 140/66.  Follow-up with PCP.    3.  DM.  Follow-up with PCP for management as directed.   4.  Health maintenance.  Pt reports she has never undergone colonoscopy.  CT abdomen and pelvis pending.  Pt will be referred to GI on RTC.    40 minutes spent with more than 50% spent in counseling and coordination of care and review of records.    HPI:  68 year old female referred for evaluation due to being diagnosed with DVT in 07/2015 when she had USN done 08/10/2015 that showed  IMPRESSION: Sonographic survey of the left lower extremity is positive for acute thrombus of the intramuscular gastroc vein, without extension into the popliteal vein.  Study is negative for left common femoral, femoral, and popliteal DVT.  She has been on Xarelto since that time.  She denies any bruising or bleeding.  She denies any family history of clotting disorders.  Pt denies any prior clot.  She denies surgery at time of event, hormonal therapy or extensive travel.  She reports she is active.  She denies any cancer history.  She has never undergone colonoscopy.  She had mammograms in the past.  Pt is seen today for consultation due to DVT.    Problem List Patient Active Problem List   Diagnosis Date Noted  .  Obesity (BMI 30-39.9) [E66.9] 11/22/2015  . Diabetic neuropathy ([place]) [E11.40] 11/22/2015  . History of DVT (deep vein thrombosis) [Z86.718] 08/19/2015  . Hyperlipidemia associated with type 2 diabetes mellitus ([place]) [E11.69, E78.5] 02/15/2014  . 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 Cervical radiculitis [M54.12] 07/17/2013  . Pain in joint, shoulder region [M25.519] 07/17/2013  . Vitamin D deficiency [E55.9] 11/13/2012  . Hypertension associated with diabetes ([place]) [E11.59, I10] 11/13/2012  . DM  (diabetes mellitus) ([place]) [E11.9] 11/13/2012  . Metabolic syndrome [H21.22] 11/13/2012  . Cervical post-laminectomy syndrome [M96.1] 11/12/2012  . Lumbosacral spondylosis without myelopathy [M47.817] 11/12/2012  . Mid back pain [M54.9] 06/25/2012    Past Medical History Past Medical History:  Diagnosis Date  . Anxiety   . COPD (chronic obstructive pulmonary disease) ([place])   . DDD (degenerative disc disease)   . Depression   . Diabetes mellitus without complication ([place])   . Fibromyalgia   . GERD (gastroesophageal reflux disease)   . Hypertension   . Vitamin D deficiency disease     Past Surgical History Past Surgical History:  Procedure Laterality Date  . ABDOMINAL HYSTERECTOMY    . CHOLECYSTECTOMY    . FOOT SURGERY Bilateral   . HAND SURGERY Bilateral   . SPINE SURGERY      Family History Family History  Problem Relation Age of Onset  . Cancer Mother   . Heart attack Father      Social History  reports that she quit smoking about 6 years ago. Her smoking use included cigarettes. She smoked 0.50 packs per day. She has never used smokeless tobacco.  Medications  Current Outpatient Medications:  .  ACCU-CHEK AVIVA PLUS test strip, TWICE DAILY, Disp: 100 each, Rfl: 0 .  amLODipine (NORVASC) 5 MG tablet, Take 1 tablet (5 mg total) by mouth daily., Disp: 90 tablet, Rfl: 3 .  atenolol (TENORMIN) 50 MG tablet, TAKE ONE (1) TABLET EACH DAY, Disp: 90 tablet, Rfl: 4 .  Blood Glucose Monitoring Suppl (ACCU-CHEK AVIVA PLUS) W/DEVICE KIT, 1 Device by Does not apply route 2 (two) times daily. Test blood sugar twice daily.dx.250.0, Disp: 1 kit, Rfl: 1 .  calcium-vitamin D 250-100 MG-UNIT per tablet, Take 1 tablet by mouth 2 (two) times daily., Disp: , Rfl:  .  glimepiride (AMARYL) 4 MG tablet, TAKE ONE TABLET EACH MORNING BEFORE BREAKFAST, Disp: 90 tablet, Rfl: 3 .  Liraglutide (VICTOZA) 18 MG/3ML SOPN, INJECT 1.8MCG SQ DAILY, Disp: 9 mL, Rfl: 1 .  lisinopril-hydrochlorothiazide  (PRINZIDE,ZESTORETIC) 20-12.5 MG tablet, Take 2 tablets by mouth daily., Disp: 180 tablet, Rfl: 0 .  NOVOTWIST 32G X 5 MM MISC, USE AS DIRECTED, Disp: 100 each, Rfl: 0 .  Pitavastatin Calcium (LIVALO) 2 MG TABS, TAKE ONE (1) TABLET EACH DAY, Disp: 90 tablet, Rfl: 0 .  rivaroxaban (XARELTO) 20 MG TABS tablet, Take 1 tablet (20 mg total) by mouth daily with supper. (Needs to be seen before next refill), Disp: 30 tablet, Rfl: 0 .  diclofenac sodium (VOLTAREN) 1 % GEL, Apply 2 g topically 4 (four) times daily. (Patient not taking: Reported on 06/24/2018), Disp: 300 g, Rfl: 3 .  docusate sodium (COLACE) 250 MG capsule, Take 1 capsule (250 mg total) by mouth daily. (Patient not taking: Reported on 06/24/2018), Disp: 30 capsule, Rfl: 0 .  fluticasone (FLONASE) 50 MCG/ACT nasal spray, Place 2 sprays into both nostrils daily. (Patient not taking: Reported on 06/24/2018), Disp: 16 g, Rfl: 6 .  meloxicam (MOBIC) 15 MG tablet, TAKE ONE (1) TABLET  EACH DAY (Patient not taking: Reported on 06/24/2018), Disp: 90 tablet, Rfl: 0  Allergies Patient has no known allergies.  Review of Systems Review of Systems - Oncology ROS negative   Physical Exam  Vitals Wt Readings from Last 3 Encounters:  06/24/18 189 lb 4 oz (85.8 kg)  06/16/18 192 lb 3.2 oz (87.2 kg)  07/05/17 195 lb 9.6 oz (88.7 kg)   Temp Readings from Last 3 Encounters:  06/24/18 98 F (36.7 C) (Oral)  06/16/18 (!) 97.5 F (36.4 C) (Oral)  07/05/17 97.7 F (36.5 C) (Oral)   BP Readings from Last 3 Encounters:  06/24/18 140/66  06/16/18 (!) 158/79  07/05/17 (!) 157/84   Pulse Readings from Last 3 Encounters:  06/24/18 66  06/16/18 72  07/05/17 63   Constitutional: Well-developed, well-nourished, and in no distress.   HENT: Head: Normocephalic and atraumatic.  Mouth/Throat: No oropharyngeal exudate. Mucosa moist. Eyes: Pupils are equal, round, and reactive to light. Conjunctivae are normal. No scleral icterus.  Neck: Normal range of  motion. Neck supple. No JVD present.  Cardiovascular: Normal rate, regular rhythm and normal heart sounds.  Exam reveals no gallop and no friction rub.   No murmur heard. Pulmonary/Chest: Effort normal and breath sounds normal. No respiratory distress. No wheezes.No rales.  Abdominal: Soft. Bowel sounds are normal. No distension. There is no tenderness. There is no guarding.  Musculoskeletal: No edema or tenderness.  Lymphadenopathy: No cervical,axillary or supraclavicular adenopathy.  Neurological: Alert and oriented to person, place, and time. No cranial nerve deficit.  Skin: Skin is warm and dry. No rash noted. No erythema. No pallor.  Psychiatric: Affect and judgment normal.   Labs No visits with results within 3 Day(s) from this visit.  Latest known visit with results is:  Hospital Outpatient Visit on 06/16/2018  Component Date Value Ref Range Status  . WBC 06/16/2018 9.7  4.0 - 10.5 K/uL Final  . RBC 06/16/2018 4.66  3.87 - 5.11 MIL/uL Final  . Hemoglobin 06/16/2018 15.0  12.0 - 15.0 g/dL Final  . HCT 06/16/2018 45.8  36.0 - 46.0 % Final  . MCV 06/16/2018 98.3  80.0 - 100.0 fL Final  . MCH 06/16/2018 32.2  26.0 - 34.0 pg Final  . MCHC 06/16/2018 32.8  30.0 - 36.0 g/dL Final  . RDW 06/16/2018 12.7  11.5 - 15.5 % Final  . Platelets 06/16/2018 233  150 - 400 K/uL Final  . nRBC 06/16/2018 0.0  0.0 - 0.2 % Final  . Neutrophils Relative % 06/16/2018 57  % Final  . Neutro Abs 06/16/2018 5.6  1.7 - 7.7 K/uL Final  . Lymphocytes Relative 06/16/2018 30  % Final  . Lymphs Abs 06/16/2018 2.9  0.7 - 4.0 K/uL Final  . Monocytes Relative 06/16/2018 10  % Final  . Monocytes Absolute 06/16/2018 1.0  0.1 - 1.0 K/uL Final  . Eosinophils Relative 06/16/2018 2  % Final  . Eosinophils Absolute 06/16/2018 0.2  0.0 - 0.5 K/uL Final  . Basophils Relative 06/16/2018 1  % Final  . Basophils Absolute 06/16/2018 0.1  0.0 - 0.1 K/uL Final  . Immature Granulocytes 06/16/2018 0  % Final  . Abs Immature  Granulocytes 06/16/2018 0.03  0.00 - 0.07 K/uL Final   Performed at [facility], [address] [place], [place] [address]  . Sodium 06/16/2018 139  135 - 145 mmol/L Final  . Potassium 06/16/2018 3.8  3.5 - 5.1 mmol/L Final  . Chloride 06/16/2018 100  98 - 111  mmol/L Final  . CO2 06/16/2018 29  22 - 32 mmol/L Final  . Glucose, Bld 06/16/2018 82  70 - 99 mg/dL Final  . BUN 06/16/2018 21  8 - 23 mg/dL Final  . Creatinine, Ser 06/16/2018 0.81  0.44 - 1.00 mg/dL Final  . Calcium 06/16/2018 9.3  8.9 - 10.3 mg/dL Final  . Total Protein 06/16/2018 7.0  6.5 - 8.1 g/dL Final  . Albumin 06/16/2018 4.0  3.5 - 5.0 g/dL Final  . AST 06/16/2018 19  15 - 41 U/L Final  . ALT 06/16/2018 17  0 - 44 U/L Final  . Alkaline Phosphatase 06/16/2018 31* 38 - 126 U/L Final  . Total Bilirubin 06/16/2018 0.2* 0.3 - 1.2 mg/dL Final  . GFR calc non Af Amer 06/16/2018 >60  >60 mL/min Final  . GFR calc Af Amer 06/16/2018 >60  >60 mL/min Final  . Anion gap 06/16/2018 10  5 - 15 Final   Performed at [facility], [address] [place], [place] [address]  . Cholesterol 06/16/2018 149  0 - 200 mg/dL Final  . Triglycerides 06/16/2018 182* <150 mg/dL Final  . HDL 06/16/2018 50  >40 mg/dL Final  . Total CHOL/HDL Ratio 06/16/2018 3.0  RATIO Final  . VLDL 06/16/2018 36  0 - 40 mg/dL Final  . LDL Cholesterol 06/16/2018 63  0 - 99 mg/dL Final   Comment:        Total Cholesterol/HDL:CHD Risk Coronary Heart Disease Risk Table                     Men   Women  1/2 Average Risk   3.4   3.3  Average Risk       5.0   4.4  2 X Average Risk   9.6   7.1  3 X Average Risk  23.4   11.0        Use the calculated Patient Ratio above and the CHD Risk Table to determine the patient's CHD Risk.        ATP III CLASSIFICATION (LDL):  <100     mg/dL   Optimal  100-129  mg/dL   Near or Above                    Optimal  130-159  mg/dL   Borderline  160-189  mg/dL   High  >190     mg/dL   Very High Performed at [facility],  [address], [place], [place] [address]   . TSH 06/16/2018 1.102  0.350 - 4.500 uIU/mL Final   Comment: Performed by a 3rd Generation assay with a functional sensitivity of <=0.01 uIU/mL. Performed at [facility], [address], [place], [place] [address]   . Vit D, 25-Hydroxy 06/16/2018 83.7  30.0 - 100.0 ng/mL Final   Comment: (NOTE) Vitamin D deficiency has been defined by the [place] practice guideline as a level of serum 25-OH vitamin D less than 20 ng/mL (1,2). The Endocrine Society went on to further define vitamin D insufficiency as a level between 21 and 29 ng/mL (2). 1. IOM (Institute of Medicine). 2010. Dietary reference   intakes for calcium and D. [place]: The   [employer]. 2. Holick MF, Binkley , Bischoff-Ferrari HA, et al.   Evaluation, treatment, and prevention of vitamin D   deficiency: an Endocrine Society clinical practice   guideline. JCEM. 2011 Jul; 96(7):1911-30. Performed At: [facility]  [place], [place] [id number] Rush Farmer MD [id number]   . Microalb, Ur 06/16/2018 <3.0* Not Estab. ug/mL Final   **Verified by repeat analysis**  . Microalb Creat Ratio 06/16/2018 <3  0 - 29 mg/g creat Final   Comment: (NOTE)                       Normal:                0 -  29                       Moderately increased: 30 - 300                       Severely Increased:       >300              **Please note reference interval change** Performed At: [facility] [place], [place] [id number] Rush Farmer MD [id number]   . Creatinine, Urine 06/16/2018 87.7  Not Estab. mg/dL Final     Pathology Orders Placed This Encounter  Procedures  . CT CHEST W CONTRAST    Standing Status:   Future    Standing Expiration Date:   06/24/2019    Order Specific Question:   If indicated for the ordered procedure, I authorize the administration of contrast media per Radiology  protocol    Answer:   Yes    Order Specific Question:   Preferred imaging location?    Answer:   [facility]    Order Specific Question:   Radiology Contrast Protocol - do NOT remove file path    Answer:   \\[email or web address]  . CT ABDOMEN PELVIS W CONTRAST    Standing Status:   Future    Standing Expiration Date:   06/24/2019    Order Specific Question:   If indicated for the ordered procedure, I authorize the administration of contrast media per Radiology protocol    Answer:   Yes    Order Specific Question:   Preferred imaging location?    Answer:   [facility]    Order Specific Question:   Is Oral Contrast requested for this exam?    Answer:   Yes, Per Radiology protocol    Order Specific Question:   Radiology Contrast Protocol - do NOT remove file path    Answer:   \\[email or web address]  . US Venous Img Lower Bilateral    Standing Status:   Future    Standing Expiration Date:   06/24/2019    Order Specific Question:   Reason for Exam (SYMPTOM  OR DIAGNOSIS REQUIRED)    Answer:   left DVT history    Order Specific Question:   Preferred imaging location?    Answer:   [facility]  . Factor 5 leiden    Standing Status:   Future    Number of Occurrences:   1    Standing Expiration Date:   06/25/2019  . Prothrombin gene mutation    Standing Status:   Future    Number of Occurrences:   1    Standing Expiration Date:   06/25/2019  . Beta-2-glycoprotein i abs, IgG/M/A    Standing Status:   Future    Number of Occurrences:   1    Standing Expiration Date:   06/25/2019  . Lupus anticoagulant panel    Standing Status:  Future    Number of Occurrences:   1    Standing Expiration Date:   06/25/2019  . ANA, IFA (with reflex)    Standing Status:   Future    Number of Occurrences:   1    Standing Expiration Date:   06/25/2019       Zoila Shutter MD

## 2018-06-25 LAB — BETA-2-GLYCOPROTEIN I ABS, IGG/M/A
Beta-2 Glyco I IgG: <9 GPI IgG units (ref 0–20)
Beta-2-Glycoprotein I IgA: <9 GPI IgA units (ref 0–25)
Beta-2-Glycoprotein I IgM: <9 GPI IgM units (ref 0–32)

## 2018-06-25 LAB — ANTINUCLEAR ANTIBODIES, IFA: ANA Ab, IFA: NEGATIVE

## 2018-06-26 ENCOUNTER — Other Ambulatory Visit: Payer: Self-pay | Admitting: Family

## 2018-06-26 ENCOUNTER — Telehealth: Payer: Self-pay | Admitting: Family

## 2018-06-26 DIAGNOSIS — I824Y2 Acute embolism and thrombosis of unspecified deep veins of left proximal lower extremity: Secondary | ICD-10-CM

## 2018-06-26 LAB — DRVVT CONFIRM: dRVVT Confirm: 2 ratio — ABNORMAL HIGH (ref 0.8–1.2)

## 2018-06-26 LAB — LUPUS ANTICOAGULANT PANEL
DRVVT: 111.4 s — ABNORMAL HIGH (ref 0.0–47.0)
PTT Lupus Anticoagulant: 33.9 s (ref 0.0–51.9)

## 2018-06-26 LAB — DRVVT MIX: dRVVT Mix: 69.7 s — ABNORMAL HIGH (ref 0.0–47.0)

## 2018-06-26 NOTE — Telephone Encounter (Signed)
Patient aware and verbalizes understanding. 

## 2018-06-26 NOTE — Telephone Encounter (Signed)
Last combo script with lisinopril was done in 2017.  Please review and advise.

## 2018-06-26 NOTE — Telephone Encounter (Signed)
Yes she is to continue taking the lisinopril-HCTZ and the amlodipine. We added the Amlodipine because her BP was elevated on her last visit.

## 2018-06-27 LAB — FACTOR 5 LEIDEN

## 2018-06-30 LAB — PROTHROMBIN GENE MUTATION

## 2018-07-01 ENCOUNTER — Ambulatory Visit (INDEPENDENT_AMBULATORY_CARE_PROVIDER_SITE_OTHER): Payer: Medicare Other | Admitting: Family

## 2018-07-01 ENCOUNTER — Encounter: Payer: Self-pay | Admitting: Family

## 2018-07-01 VITALS — BP 127/60 | HR 75 | Temp 96.8°F | Ht 62.5 in | Wt 186.8 lb

## 2018-07-01 DIAGNOSIS — M79675 Pain in left toe(s): Secondary | ICD-10-CM | POA: Diagnosis not present

## 2018-07-01 DIAGNOSIS — E1159 Type 2 diabetes mellitus with other circulatory complications: Secondary | ICD-10-CM | POA: Diagnosis not present

## 2018-07-01 DIAGNOSIS — L03032 Cellulitis of left toe: Secondary | ICD-10-CM | POA: Diagnosis not present

## 2018-07-01 DIAGNOSIS — I10 Essential (primary) hypertension: Secondary | ICD-10-CM | POA: Diagnosis not present

## 2018-07-01 NOTE — Progress Notes (Signed)
   Subjective:    Patient ID: ASPEN LAWRANCE, female    DOB: 24-Oct-1950, 68 y.o.   MRN: [id number]  Chief Complaint  Patient presents with  . Hypertension    two week recheck    Hypertension  This is a chronic problem. The current episode started more than 1 year ago. The problem has been resolved since onset. The problem is controlled. Pertinent negatives include no headaches, malaise/fatigue, peripheral edema or shortness of breath. Past treatments include calcium channel blockers and beta blockers. The current treatment provides moderate improvement.      Review of Systems  Constitutional: Negative for malaise/fatigue.  Respiratory: Negative for shortness of breath.   Neurological: Negative for headaches.  All other systems reviewed and are negative.      Objective:   Physical Exam Vitals signs reviewed.  Constitutional:      General: She is not in acute distress.    Appearance: She is well-developed.  HENT:     Head: Normocephalic and atraumatic.  Eyes:     Pupils: Pupils are equal, round, and reactive to light.  Neck:     Musculoskeletal: Normal range of motion and neck supple.     Thyroid: No thyromegaly.  Cardiovascular:     Rate and Rhythm: Normal rate and regular rhythm.     Heart sounds: Normal heart sounds. No murmur.  Pulmonary:     Effort: Pulmonary effort is normal. No respiratory distress.     Breath sounds: Normal breath sounds. No wheezing.  Abdominal:     General: Bowel sounds are normal. There is no distension.     Palpations: Abdomen is soft.     Tenderness: There is no abdominal tenderness.  Musculoskeletal: Normal range of motion.        General: No tenderness.  Skin:    General: Skin is warm and dry.  Neurological:     Mental Status: She is alert and oriented to person, place, and time.     Cranial Nerves: No cranial nerve deficit.     Deep Tendon Reflexes: Reflexes are normal and symmetric.  Psychiatric:        Behavior: Behavior  normal.        Thought Content: Thought content normal.        Judgment: Judgment normal.     BP 127/60   Pulse 75   Temp (!) 96.8 F (36 C) (Oral)   Ht 5' 2.5" (1.588 m)   Wt 186 lb 12.8 oz (84.7 kg)   BMI 33.62 kg/m      Assessment & Plan:  SHATONIA HOOTS comes in today with chief complaint of Hypertension (two week recheck)   Diagnosis and orders addressed:  1. Hypertension associated with diabetes ([place]) -Daily blood pressure log given with instructions on how to fill out and told to bring to next visit -Dash diet information given -Exercise encouraged - Stress Management  -Continue current meds -RTO in 6 month  - [place], FNP

## 2018-07-01 NOTE — Patient Instructions (Signed)

## 2018-07-02 LAB — BMP8+EGFR
BUN/Creatinine Ratio: 23 (ref 12–28)
BUN: 26 mg/dL (ref 8–27)
CHLORIDE: 100 mmol/L (ref 96–106)
CO2: 25 mmol/L (ref 20–29)
Calcium: 9.5 mg/dL (ref 8.7–10.3)
Creatinine, Ser: 1.14 mg/dL — ABNORMAL HIGH (ref 0.57–1.00)
GFR, EST AFRICAN AMERICAN: 58 mL/min/{1.73_m2} — AB (ref >59)
GFR, EST NON AFRICAN AMERICAN: 51 mL/min/{1.73_m2} — AB (ref >59)
Glucose: 126 mg/dL — ABNORMAL HIGH (ref 65–99)
Potassium: 4.5 mmol/L (ref 3.5–5.2)
Sodium: 142 mmol/L (ref 134–144)

## 2018-07-10 ENCOUNTER — Ambulatory Visit (HOSPITAL_COMMUNITY)
Admission: RE | Admit: 2018-07-10 | Discharge: 2018-07-10 | Disposition: A | Discharge status: Home / Self Care | Payer: Medicare Other | Source: Ambulatory Visit | Attending: Internal Medicine | Admitting: Internal Medicine

## 2018-07-10 DIAGNOSIS — Z720 Tobacco use: Secondary | ICD-10-CM | POA: Diagnosis not present

## 2018-07-10 DIAGNOSIS — R634 Abnormal weight loss: Secondary | ICD-10-CM

## 2018-07-10 DIAGNOSIS — R918 Other nonspecific abnormal finding of lung field: Secondary | ICD-10-CM | POA: Diagnosis not present

## 2018-07-10 DIAGNOSIS — Z86718 Personal history of other venous thrombosis and embolism: Secondary | ICD-10-CM | POA: Diagnosis not present

## 2018-07-10 DIAGNOSIS — D3502 Benign neoplasm of left adrenal gland: Secondary | ICD-10-CM | POA: Diagnosis not present

## 2018-07-10 MED ORDER — IOHEXOL 300 MG/ML  SOLN
100.0000 mL | Freq: Once | INTRAMUSCULAR | Status: AC | PRN
  Administered 2018-07-10: 100 mL via INTRAVENOUS

## 2018-07-14 ENCOUNTER — Inpatient Hospital Stay (HOSPITAL_COMMUNITY): Payer: Medicare Other | Attending: Internal Medicine | Admitting: Internal Medicine

## 2018-07-14 ENCOUNTER — Encounter (HOSPITAL_COMMUNITY): Payer: Self-pay | Admitting: Internal Medicine

## 2018-07-14 ENCOUNTER — Other Ambulatory Visit: Payer: Self-pay

## 2018-07-14 VITALS — BP 132/41 | HR 67 | Temp 97.9°F | Resp 18 | Wt 186.4 lb

## 2018-07-14 DIAGNOSIS — Z791 Long term (current) use of non-steroidal anti-inflammatories (NSAID): Secondary | ICD-10-CM | POA: Insufficient documentation

## 2018-07-14 DIAGNOSIS — Z79899 Other long term (current) drug therapy: Secondary | ICD-10-CM | POA: Diagnosis not present

## 2018-07-14 DIAGNOSIS — Z7901 Long term (current) use of anticoagulants: Secondary | ICD-10-CM | POA: Insufficient documentation

## 2018-07-14 DIAGNOSIS — Z87891 Personal history of nicotine dependence: Secondary | ICD-10-CM | POA: Diagnosis not present

## 2018-07-14 DIAGNOSIS — D3502 Benign neoplasm of left adrenal gland: Secondary | ICD-10-CM | POA: Diagnosis not present

## 2018-07-14 DIAGNOSIS — E119 Type 2 diabetes mellitus without complications: Secondary | ICD-10-CM | POA: Insufficient documentation

## 2018-07-14 DIAGNOSIS — Z86718 Personal history of other venous thrombosis and embolism: Secondary | ICD-10-CM | POA: Diagnosis not present

## 2018-07-14 DIAGNOSIS — Z7984 Long term (current) use of oral hypoglycemic drugs: Secondary | ICD-10-CM | POA: Diagnosis not present

## 2018-07-14 DIAGNOSIS — I10 Essential (primary) hypertension: Secondary | ICD-10-CM | POA: Diagnosis not present

## 2018-07-14 DIAGNOSIS — R918 Other nonspecific abnormal finding of lung field: Secondary | ICD-10-CM | POA: Diagnosis not present

## 2018-07-14 NOTE — Progress Notes (Signed)
Diagnosis History of DVT (deep vein thrombosis) - Plan: CBC with Differential, Comprehensive metabolic panel, Lactate dehydrogenase, Lupus anticoagulant panel  Staging Cancer Staging No matching staging information was found for the patient.  Assessment and Plan:  1.  Left LE DVT.  68 year old female referred for evaluation due to being diagnosed with DVT in 07/2015 when she had USN done 08/10/2015 that showed  IMPRESSION: Sonographic survey of the left lower extremity is positive for acute thrombus of the intramuscular gastroc vein, without extension into the popliteal vein.  Study is negative for left common femoral, femoral, and popliteal DVT.  She has been on Xarelto since that time.  She denies any bruising or bleeding.  She denies any family history of clotting disorders.  Pt denies any prior clot.  She denies surgery at time of event, hormonal therapy or extensive travel.  She reports she is active.  She denies any cancer history.  She has never undergone colonoscopy.  She had mammograms in the past.    Labs done 06/24/2018 reviewed and showed Negative Factor V Leiden and Prothrombin gene mutation.  She has normal Beta 2 Glycoprotein ab of < 9. Normal ANA.  Lupus anticoagulant testing is positive with repeat testing recommended in 12 weeks.    CT CAP done 07/10/2018 reviewed and showed  IMPRESSION: No findings to account for the patient's weight loss. Specifically, no findings suspicious for malignancy in the chest, abdomen, or pelvis.  Mild subpleural nodularity in the lungs bilaterally, measuring up to 4 mm in the right upper lobe, likely benign. Non-contrast chest CT can be considered in 12 months in this high-risk patient. This recommendation follows the consensus statement: Guidelines for Management of Incidental Pulmonary Nodules Detected on CT Images: From the Fleischner Society 2017; Radiology 2017; 284:228-243.  13 mm left adrenal adenoma, benign.  Bilateral LE  dopplers done 07/10/2018 reviewed and were negative for acute or chronic DVT.    Long talk held with pt.  I discussed with her unprovoked DVTs are usually recommended for 6 months of therapy.   Pt has been on anticoagulation since 2017.   Hypercoagulable work-up negative other than + Lupus anticoagulant.  Pt will be set up for repeat Lupus anticoagulant  testing in 09/2018.  Pt desires to come off Xarelto.  I have discussed with her potential risk factors for clots.  I have also discussed prior thrombosis is also a risk factor.  She is advised if any change in symptoms to notify the office or go to ER.  She will be given a trial off therapy and will RTC in 09/2018 with repeat Lupus anticoagulant testing.  All questions answered and pt expressed understanding of the information presented.   2.  Pulmonary nodule and left adrenal adenoma.  Suspected to be benign findings.  Pt denies smoking history.  Lung nodule measures 4 mm in RUL.  She will be set up for CT chest in 06/2019 for follow-up on RTC in 09/2018.    3  HTN.  BP is 132/41.  Follow-up with PCP.    4.  DM.  Follow-up with PCP for management as directed.   5.  Health maintenance.  Pt reports she has never undergone colonoscopy.  She refuses referral to GI.  Follow-up with PCP.    25  minutes spent with more than 50% spent in counseling and coordination of care and review of records.    Interval History:  Historical data obtained from note dated 06/24/2018.  67 year  old female referred for evaluation due to being diagnosed with DVT in 07/2015 when she had USN done 08/10/2015 that showed  IMPRESSION: Sonographic survey of the left lower extremity is positive for acute thrombus of the intramuscular gastroc vein, without extension into the popliteal vein.  Study is negative for left common femoral, femoral, and popliteal DVT.  She has been on Xarelto since that time.  She denies any bruising or bleeding.  She denies any family history of clotting  disorders.  Pt denies any prior clot.  She denies surgery at time of event, hormonal therapy or extensive travel.  She reports she is active.  She denies any cancer history.  She has never undergone colonoscopy.  She had mammograms in the past.    Current Status:  Pt is seen today for follow-up.  She is here to go over labs and radiology studies.  She desires to come off Xarelto.    Problem List Patient Active Problem List   Diagnosis Date Noted  . Obesity (BMI 30-39.9) [E66.9] 11/22/2015  . Diabetic neuropathy ([place]) [E11.40] 11/22/2015  . History of DVT (deep vein thrombosis) [Z86.718] 08/19/2015  . Hyperlipidemia associated with type 2 diabetes mellitus ([place]) [E11.69, E78.5] 02/15/2014  . 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 Cervical radiculitis [M54.12] 07/17/2013  . Pain in joint, shoulder region [M25.519] 07/17/2013  . Vitamin D deficiency [E55.9] 11/13/2012  . Hypertension associated with diabetes ([place]) [E11.59, I10] 11/13/2012  . DM (diabetes mellitus) ([place]) [E11.9] 11/13/2012  . Metabolic syndrome [W09.81] 11/13/2012  . Cervical post-laminectomy syndrome [M96.1] 11/12/2012  . Lumbosacral spondylosis without myelopathy [M47.817] 11/12/2012  . Mid back pain [M54.9] 06/25/2012    Past Medical History Past Medical History:  Diagnosis Date  . Anxiety   . COPD (chronic obstructive pulmonary disease) ([place])   . DDD (degenerative disc disease)   . Depression   . Diabetes mellitus without complication ([place])   . Fibromyalgia   . GERD (gastroesophageal reflux disease)   . Hypertension   . Vitamin D deficiency disease     Past Surgical History Past Surgical History:  Procedure Laterality Date  . ABDOMINAL HYSTERECTOMY    . CHOLECYSTECTOMY    . FOOT SURGERY Bilateral   . HAND SURGERY Bilateral   . SPINE SURGERY      Family History Family History  Problem Relation Age of Onset  . Cancer Mother   . Heart attack Father      Social History  reports that she  quit smoking about 6 years ago. Her smoking use included cigarettes. She smoked 0.50 packs per day. She has never used smokeless tobacco.  Medications  Current Outpatient Medications:  .  ACCU-CHEK AVIVA PLUS test strip, TWICE DAILY, Disp: 100 each, Rfl: 0 .  amLODipine (NORVASC) 5 MG tablet, Take 1 tablet (5 mg total) by mouth daily., Disp: 90 tablet, Rfl: 3 .  atenolol (TENORMIN) 50 MG tablet, TAKE ONE (1) TABLET EACH DAY, Disp: 90 tablet, Rfl: 4 .  Blood Glucose Monitoring Suppl (ACCU-CHEK AVIVA PLUS) W/DEVICE KIT, 1 Device by Does not apply route 2 (two) times daily. Test blood sugar twice daily.dx.250.0, Disp: 1 kit, Rfl: 1 .  calcium-vitamin D 250-100 MG-UNIT per tablet, Take 1 tablet by mouth 2 (two) times daily., Disp: , Rfl:  .  diclofenac sodium (VOLTAREN) 1 % GEL, Apply 2 g topically 4 (four) times daily., Disp: 300 g, Rfl: 3 .  docusate sodium (COLACE) 250 MG capsule, Take 1 capsule (250 mg total) by mouth daily.,  Disp: 30 capsule, Rfl: 0 .  glimepiride (AMARYL) 4 MG tablet, TAKE ONE TABLET EACH MORNING BEFORE BREAKFAST, Disp: 90 tablet, Rfl: 3 .  Liraglutide (VICTOZA) 18 MG/3ML SOPN, INJECT 1.8MCG SQ DAILY, Disp: 9 mL, Rfl: 1 .  lisinopril-hydrochlorothiazide (PRINZIDE,ZESTORETIC) 20-12.5 MG tablet, Take 2 tablets by mouth daily., Disp: 180 tablet, Rfl: 0 .  meloxicam (MOBIC) 15 MG tablet, TAKE ONE (1) TABLET EACH DAY, Disp: 90 tablet, Rfl: 0 .  NOVOTWIST 32G X 5 MM MISC, USE AS DIRECTED, Disp: 100 each, Rfl: 0 .  Pitavastatin Calcium (LIVALO) 2 MG TABS, TAKE ONE (1) TABLET EACH DAY, Disp: 90 tablet, Rfl: 0 .  XARELTO 20 MG TABS tablet, TAKE 1 TABLET DAILY WITH SUPPER, Disp: 30 tablet, Rfl: 1 .  fluticasone (FLONASE) 50 MCG/ACT nasal spray, Place 2 sprays into both nostrils daily., Disp: 16 g, Rfl: 6  Allergies Patient has no known allergies.  Review of Systems Review of Systems - Oncology ROS negative   Physical Exam  Vitals Wt Readings from Last 3 Encounters:  07/14/18  186 lb 6.4 oz (84.6 kg)  07/01/18 186 lb 12.8 oz (84.7 kg)  06/24/18 189 lb 4 oz (85.8 kg)   Temp Readings from Last 3 Encounters:  07/14/18 97.9 F (36.6 C) (Oral)  07/01/18 (!) 96.8 F (36 C) (Oral)  06/24/18 98 F (36.7 C) (Oral)   BP Readings from Last 3 Encounters:  07/14/18 (!) 132/41  07/01/18 127/60  06/24/18 140/66   Pulse Readings from Last 3 Encounters:  07/14/18 67  07/01/18 75  06/24/18 66   Constitutional: Well-developed, well-nourished, and in no distress.   HENT: Head: Normocephalic and atraumatic.  Mouth/Throat: No oropharyngeal exudate. Mucosa moist. Eyes: Pupils are equal, round, and reactive to light. Conjunctivae are normal. No scleral icterus.  Neck: Normal range of motion. Neck supple. No JVD present.  Cardiovascular: Normal rate, regular rhythm and normal heart sounds.  Exam reveals no gallop and no friction rub.   No murmur heard. Pulmonary/Chest: Effort normal and breath sounds normal. No respiratory distress. No wheezes.No rales.  Abdominal: Soft. Bowel sounds are normal. No distension. There is no tenderness. There is no guarding.  Musculoskeletal: No edema or tenderness. Minor varicosities noted  Lymphadenopathy: No cervical, axillary or supraclavicular adenopathy.  Neurological: Alert and oriented to person, place, and time. No cranial nerve deficit.  Skin: Skin is warm and dry. No rash noted. No erythema. No pallor.  Psychiatric: Affect and judgment normal.   Labs No visits with results within 3 Day(s) from this visit.  Latest known visit with results is:  Office Visit on 07/01/2018  Component Date Value Ref Range Status  . Glucose 07/01/2018 126* 65 - 99 mg/dL Final  . BUN 07/01/2018 26  8 - 27 mg/dL Final  . Creatinine, Ser 07/01/2018 1.14* 0.57 - 1.00 mg/dL Final  . GFR calc non Af Amer 07/01/2018 51* >59 mL/min/1.73 Final  . GFR calc Af Amer 07/01/2018 58* >59 mL/min/1.73 Final  . BUN/Creatinine Ratio 07/01/2018 23  12 - 28 Final  .  Sodium 07/01/2018 142  134 - 144 mmol/L Final  . Potassium 07/01/2018 4.5  3.5 - 5.2 mmol/L Final  . Chloride 07/01/2018 100  96 - 106 mmol/L Final  . CO2 07/01/2018 25  20 - 29 mmol/L Final  . Calcium 07/01/2018 9.5  8.7 - 10.3 mg/dL Final     Pathology Orders Placed This Encounter  Procedures  . CBC with Differential    Standing Status:  Future    Standing Expiration Date:   07/15/2019  . Comprehensive metabolic panel    Standing Status:   Future    Standing Expiration Date:   07/15/2019  . Lactate dehydrogenase    Standing Status:   Future    Standing Expiration Date:   07/15/2019  . Lupus anticoagulant panel    Standing Status:   Future    Standing Expiration Date:   07/15/2019       Zoila Shutter MD

## 2018-07-15 DIAGNOSIS — L03032 Cellulitis of left toe: Secondary | ICD-10-CM | POA: Diagnosis not present

## 2018-07-29 DIAGNOSIS — B351 Tinea unguium: Secondary | ICD-10-CM | POA: Diagnosis not present

## 2018-07-29 DIAGNOSIS — E1142 Type 2 diabetes mellitus with diabetic polyneuropathy: Secondary | ICD-10-CM | POA: Diagnosis not present

## 2018-07-29 DIAGNOSIS — M79676 Pain in unspecified toe(s): Secondary | ICD-10-CM | POA: Diagnosis not present

## 2018-07-29 DIAGNOSIS — L84 Corns and callosities: Secondary | ICD-10-CM | POA: Diagnosis not present

## 2018-09-29 ENCOUNTER — Other Ambulatory Visit (HOSPITAL_COMMUNITY): Payer: Self-pay

## 2018-09-29 DIAGNOSIS — C9000 Multiple myeloma not having achieved remission: Secondary | ICD-10-CM

## 2018-09-29 DIAGNOSIS — D508 Other iron deficiency anemias: Secondary | ICD-10-CM

## 2018-09-30 ENCOUNTER — Other Ambulatory Visit (HOSPITAL_COMMUNITY): Payer: Medicare Other

## 2018-10-01 ENCOUNTER — Other Ambulatory Visit: Payer: Self-pay | Admitting: Family

## 2018-10-01 ENCOUNTER — Other Ambulatory Visit: Payer: Self-pay

## 2018-10-01 ENCOUNTER — Inpatient Hospital Stay (HOSPITAL_COMMUNITY): Payer: Medicare Other | Attending: Hematology

## 2018-10-01 ENCOUNTER — Other Ambulatory Visit (HOSPITAL_COMMUNITY)
Admission: RE | Admit: 2018-10-01 | Discharge: 2018-10-01 | Disposition: A | Discharge status: Home / Self Care | Payer: Medicare Other | Source: Ambulatory Visit | Attending: Family | Admitting: Family

## 2018-10-01 DIAGNOSIS — Z791 Long term (current) use of non-steroidal anti-inflammatories (NSAID): Secondary | ICD-10-CM | POA: Diagnosis not present

## 2018-10-01 DIAGNOSIS — Z8249 Family history of ischemic heart disease and other diseases of the circulatory system: Secondary | ICD-10-CM | POA: Insufficient documentation

## 2018-10-01 DIAGNOSIS — E0800 Diabetes mellitus due to underlying condition with hyperosmolarity without nonketotic hyperglycemic-hyperosmolar coma (NKHHC): Secondary | ICD-10-CM | POA: Diagnosis not present

## 2018-10-01 DIAGNOSIS — Z79899 Other long term (current) drug therapy: Secondary | ICD-10-CM | POA: Diagnosis not present

## 2018-10-01 DIAGNOSIS — I10 Essential (primary) hypertension: Secondary | ICD-10-CM | POA: Diagnosis not present

## 2018-10-01 DIAGNOSIS — R911 Solitary pulmonary nodule: Secondary | ICD-10-CM | POA: Insufficient documentation

## 2018-10-01 DIAGNOSIS — C9000 Multiple myeloma not having achieved remission: Secondary | ICD-10-CM

## 2018-10-01 DIAGNOSIS — Z7951 Long term (current) use of inhaled steroids: Secondary | ICD-10-CM | POA: Diagnosis not present

## 2018-10-01 DIAGNOSIS — Z7982 Long term (current) use of aspirin: Secondary | ICD-10-CM | POA: Diagnosis not present

## 2018-10-01 DIAGNOSIS — Z7901 Long term (current) use of anticoagulants: Secondary | ICD-10-CM | POA: Diagnosis not present

## 2018-10-01 DIAGNOSIS — D508 Other iron deficiency anemias: Secondary | ICD-10-CM

## 2018-10-01 DIAGNOSIS — Z87891 Personal history of nicotine dependence: Secondary | ICD-10-CM | POA: Diagnosis not present

## 2018-10-01 DIAGNOSIS — Z86718 Personal history of other venous thrombosis and embolism: Secondary | ICD-10-CM | POA: Diagnosis not present

## 2018-10-01 DIAGNOSIS — E119 Type 2 diabetes mellitus without complications: Secondary | ICD-10-CM | POA: Insufficient documentation

## 2018-10-01 LAB — CBC WITH DIFFERENTIAL/PLATELET
Abs Immature Granulocytes: 0.04 10*3/uL (ref 0.00–0.07)
Basophils Absolute: 0.1 10*3/uL (ref 0.0–0.1)
Basophils Relative: 1 %
Eosinophils Absolute: 0.2 10*3/uL (ref 0.0–0.5)
Eosinophils Relative: 2 %
HCT: 46.5 % — ABNORMAL HIGH (ref 36.0–46.0)
Hemoglobin: 15.7 g/dL — ABNORMAL HIGH (ref 12.0–15.0)
Immature Granulocytes: 0 %
Lymphocytes Relative: 30 %
Lymphs Abs: 3.1 10*3/uL (ref 0.7–4.0)
MCH: 32.2 pg (ref 26.0–34.0)
MCHC: 33.8 g/dL (ref 30.0–36.0)
MCV: 95.3 fL (ref 80.0–100.0)
Monocytes Absolute: 0.8 10*3/uL (ref 0.1–1.0)
Monocytes Relative: 8 %
Neutro Abs: 5.9 10*3/uL (ref 1.7–7.7)
Neutrophils Relative %: 59 %
Platelets: 252 10*3/uL (ref 150–400)
RBC: 4.88 MIL/uL (ref 3.87–5.11)
RDW: 13.2 % (ref 11.5–15.5)
WBC: 10.1 10*3/uL (ref 4.0–10.5)
nRBC: 0 % (ref 0.0–0.2)

## 2018-10-01 LAB — COMPREHENSIVE METABOLIC PANEL
ALT: 15 U/L (ref 0–44)
AST: 21 U/L (ref 15–41)
Albumin: 4.2 g/dL (ref 3.5–5.0)
Alkaline Phosphatase: 37 U/L — ABNORMAL LOW (ref 38–126)
Anion gap: 12 (ref 5–15)
BUN: 17 mg/dL (ref 8–23)
CO2: 28 mmol/L (ref 22–32)
Calcium: 9.5 mg/dL (ref 8.9–10.3)
Chloride: 100 mmol/L (ref 98–111)
Creatinine, Ser: 0.83 mg/dL (ref 0.44–1.00)
GFR calc Af Amer: >60 mL/min (ref >60)
GFR calc non Af Amer: >60 mL/min (ref >60)
Glucose, Bld: 119 mg/dL — ABNORMAL HIGH (ref 70–99)
Potassium: 3.6 mmol/L (ref 3.5–5.1)
Sodium: 140 mmol/L (ref 135–145)
Total Bilirubin: 0.9 mg/dL (ref 0.3–1.2)
Total Protein: 7.3 g/dL (ref 6.5–8.1)

## 2018-10-01 LAB — HEMOGLOBIN A1C
Hgb A1c MFr Bld: 5.8 % — ABNORMAL HIGH (ref 4.8–5.6)
Mean Plasma Glucose: 119.76 mg/dL

## 2018-10-01 LAB — LACTATE DEHYDROGENASE: LDH: 141 U/L (ref 98–192)

## 2018-10-02 ENCOUNTER — Telehealth: Payer: Self-pay | Admitting: Family

## 2018-10-02 LAB — LUPUS ANTICOAGULANT PANEL
DRVVT: 35.3 s (ref 0.0–47.0)
PTT Lupus Anticoagulant: 25.3 s (ref 0.0–51.9)

## 2018-10-06 ENCOUNTER — Other Ambulatory Visit: Payer: Self-pay

## 2018-10-06 ENCOUNTER — Ambulatory Visit (INDEPENDENT_AMBULATORY_CARE_PROVIDER_SITE_OTHER): Payer: Medicare Other | Admitting: Family

## 2018-10-06 ENCOUNTER — Encounter: Payer: Self-pay | Admitting: Family

## 2018-10-06 VITALS — BP 140/66 | HR 64 | Temp 97.1°F | Ht 62.5 in | Wt 186.6 lb

## 2018-10-06 DIAGNOSIS — E0800 Diabetes mellitus due to underlying condition with hyperosmolarity without nonketotic hyperglycemic-hyperosmolar coma (NKHHC): Secondary | ICD-10-CM | POA: Diagnosis not present

## 2018-10-06 DIAGNOSIS — Z86718 Personal history of other venous thrombosis and embolism: Secondary | ICD-10-CM

## 2018-10-06 DIAGNOSIS — E1169 Type 2 diabetes mellitus with other specified complication: Secondary | ICD-10-CM | POA: Diagnosis not present

## 2018-10-06 DIAGNOSIS — Z1211 Encounter for screening for malignant neoplasm of colon: Secondary | ICD-10-CM

## 2018-10-06 DIAGNOSIS — E785 Hyperlipidemia, unspecified: Secondary | ICD-10-CM

## 2018-10-06 DIAGNOSIS — E1142 Type 2 diabetes mellitus with diabetic polyneuropathy: Secondary | ICD-10-CM

## 2018-10-06 DIAGNOSIS — E1159 Type 2 diabetes mellitus with other circulatory complications: Secondary | ICD-10-CM

## 2018-10-06 DIAGNOSIS — E669 Obesity, unspecified: Secondary | ICD-10-CM

## 2018-10-06 DIAGNOSIS — Z1212 Encounter for screening for malignant neoplasm of rectum: Secondary | ICD-10-CM

## 2018-10-06 DIAGNOSIS — I10 Essential (primary) hypertension: Secondary | ICD-10-CM

## 2018-10-06 NOTE — Progress Notes (Signed)
Subjective:    Patient ID: Holly Price, female    DOB: 02-May-1951, 68 y.o.   MRN: [id number]  Chief Complaint  Patient presents with  . Medical Management of Chronic Issues  . Diabetes   PT presents to the office today for chronic follow up. She had blood work drawn on 10/01/18 from her Oncologists. She has a hx of DVT and was taken off her xarelto in February 2020.  Diabetes  She presents for her follow-up diabetic visit. She has type 2 diabetes mellitus. Her disease course has been stable. There are no hypoglycemic associated symptoms. Pertinent negatives for diabetes include no blurred vision and no foot paresthesias. There are no hypoglycemic complications. Symptoms are stable. Diabetic complications include peripheral neuropathy. Pertinent negatives for diabetic complications include no CVA, heart disease or nephropathy. Risk factors for coronary artery disease include dyslipidemia, diabetes mellitus, hypertension, sedentary lifestyle and post-menopausal. Her weight is stable. She is following a generally healthy diet. Her overall blood glucose range is 110-130 mg/dl. Eye exam is current.  Hypertension  This is a chronic problem. The current episode started more than 1 year ago. The problem has been waxing and waning since onset. The problem is uncontrolled. Pertinent negatives include no blurred vision, malaise/fatigue, peripheral edema or shortness of breath. Risk factors for coronary artery disease include diabetes mellitus, dyslipidemia and sedentary lifestyle. The current treatment provides mild improvement. There is no history of CVA.  Hyperlipidemia  This is a chronic problem. The current episode started more than 1 year ago. The problem is controlled. Recent lipid tests were reviewed and are normal. Exacerbating diseases include obesity. Pertinent negatives include no shortness of breath. Current antihyperlipidemic treatment includes statins. The current treatment provides moderate  improvement of lipids. Risk factors for coronary artery disease include dyslipidemia, hypertension, a sedentary lifestyle and post-menopausal.  Arthritis  Presents for follow-up visit. She complains of pain. The symptoms have been stable. Affected locations include the left shoulder (back). Her pain is at a severity of 2/10.  Diabetic Neuropathy PT states her numbness and pain has greatly improved.     Review of Systems  Constitutional: Negative for malaise/fatigue.  Eyes: Negative for blurred vision.  Respiratory: Negative for shortness of breath.   Musculoskeletal: Positive for arthritis.  All other systems reviewed and are negative.      Objective:   Physical Exam Vitals signs reviewed.  Constitutional:      General: She is not in acute distress.    Appearance: She is well-developed.  HENT:     Head: Normocephalic and atraumatic.     Right Ear: Tympanic membrane normal.     Left Ear: Tympanic membrane normal.  Eyes:     Pupils: Pupils are equal, round, and reactive to light.  Neck:     Musculoskeletal: Normal range of motion and neck supple.     Thyroid: No thyromegaly.  Cardiovascular:     Rate and Rhythm: Normal rate and regular rhythm.     Heart sounds: Normal heart sounds. No murmur.  Pulmonary:     Effort: Pulmonary effort is normal. No respiratory distress.     Breath sounds: Normal breath sounds. No wheezing.  Abdominal:     General: Bowel sounds are normal. There is no distension.     Palpations: Abdomen is soft.     Tenderness: There is no abdominal tenderness.  Musculoskeletal: Normal range of motion.        General: No tenderness.  Skin:    General:  Skin is warm and dry.  Neurological:     Mental Status: She is alert and oriented to person, place, and time.     Cranial Nerves: No cranial nerve deficit.     Deep Tendon Reflexes: Reflexes are normal and symmetric.  Psychiatric:        Behavior: Behavior normal.        Thought Content: Thought content  normal.        Judgment: Judgment normal.       BP 140/66   Pulse 64   Temp (!) 97.1 F (36.2 C) (Oral)   Ht 5' 2.5" (1.588 m)   Wt 186 lb 9.6 oz (84.6 kg)   BMI 33.59 kg/m      Assessment & Plan:  Holly Price comes in today with chief complaint of Medical Management of Chronic Issues and Diabetes   Diagnosis and orders addressed:  1. Diabetes mellitus due to underlying condition with hyperosmolarity without coma, without long-term current use of insulin (HCC) -A1C at goal, will stop glimepiride 4 mg today Continue low carb diet   2. Diabetic polyneuropathy associated with type 2 diabetes mellitus ([place])  3. History of DVT (deep vein thrombosis)  4. Hyperlipidemia associated with type 2 diabetes mellitus ([place])  5. Hypertension associated with diabetes ([place])  6. Obesity (BMI 30-39.9)  7. Colon cancer screening - Cologuard  8. Screening for malignant neoplasm of the rectum - Cologuard   Labs reviewed Health Maintenance reviewed- PT will schedule diabetic eye exam Diet and exercise encouraged  Follow up plan: 4 months    Evelina Dun, FNP

## 2018-10-06 NOTE — Patient Instructions (Signed)

## 2018-10-07 ENCOUNTER — Inpatient Hospital Stay (HOSPITAL_COMMUNITY): Payer: Medicare Other | Admitting: Nurse Practitioner

## 2018-10-07 DIAGNOSIS — Z7951 Long term (current) use of inhaled steroids: Secondary | ICD-10-CM | POA: Diagnosis not present

## 2018-10-07 DIAGNOSIS — Z86718 Personal history of other venous thrombosis and embolism: Secondary | ICD-10-CM | POA: Diagnosis not present

## 2018-10-07 DIAGNOSIS — E119 Type 2 diabetes mellitus without complications: Secondary | ICD-10-CM

## 2018-10-07 DIAGNOSIS — Z79899 Other long term (current) drug therapy: Secondary | ICD-10-CM | POA: Diagnosis not present

## 2018-10-07 DIAGNOSIS — Z7982 Long term (current) use of aspirin: Secondary | ICD-10-CM | POA: Diagnosis not present

## 2018-10-07 DIAGNOSIS — Z7901 Long term (current) use of anticoagulants: Secondary | ICD-10-CM | POA: Diagnosis not present

## 2018-10-07 DIAGNOSIS — Z8249 Family history of ischemic heart disease and other diseases of the circulatory system: Secondary | ICD-10-CM

## 2018-10-07 DIAGNOSIS — I10 Essential (primary) hypertension: Secondary | ICD-10-CM | POA: Diagnosis not present

## 2018-10-07 DIAGNOSIS — Z791 Long term (current) use of non-steroidal anti-inflammatories (NSAID): Secondary | ICD-10-CM

## 2018-10-07 DIAGNOSIS — Z87891 Personal history of nicotine dependence: Secondary | ICD-10-CM

## 2018-10-07 DIAGNOSIS — R911 Solitary pulmonary nodule: Secondary | ICD-10-CM

## 2018-10-07 NOTE — Assessment & Plan Note (Addendum)
1.  Left LE DVT: - Patient was diagnosed with a DVT and 07/2015. -She had an ultrasound done on 08/10/2015 that showed left lower extremity was positive for acute thrombus of the intramuscular gastric vein, without extension into the popliteal vein. -She was placed on Xarelto and has been taking it since. -She denies a family history of clotting disorders.  She denies any previous clots.  She denies any surgery at the time of the event, or hormonal therapy, or extensive travel.  She reports she is active.  She denies any cancer history. - Work-up was done on 06/24/2018 which showed negative for factor V Leiden and prothrombin gene mutation.  She has a normal beta-2 glycoprotein AB of <9.  Normal ANA.  Lupus anticoagulant testing is positive with repeat testing recommended in 12 weeks. - Patient had bilateral LE Dopplers done on 07/10/2018 which was negative for acute or chronic DVT. - Patient desired to come off of her Xarelto.  She has been off for 3 months now.  Repeat lupus anticoagulant testing was repeated on 10/01/2018 which was negative. -Other labs on 10/01/2018 her potassium was 3.6, creatinine 0.83, hemoglobin 15.7, platelets 252, and WBC 10.1. -She is taking a baby aspirin daily. -We will continue to follow her.  She is aware if any symptoms occur she needs to notify the office or go to the ER.  She is aware of the risks of coming off her Xarelto.  2.  Pulmonary nodule and left adrenal adenoma: - Patient had a CT CAP done on 07/10/2022 unexplained weight loss.  There was a lung nodule that measures 4 mm in the right lower lobe, which is likely benign. - We will set her up for a CT of the chest in 06/2019 for follow-up.  3.  Health maintenance: - She has never undergone a colonoscopy and refuses a referral to GI at this time. -She will follow-up with her PCP. - Her last mammogram was in 2019 per the patient she is aware and will schedule this through her PCP.

## 2018-10-07 NOTE — Patient Instructions (Signed)
[facility] at [facility] Discharge Instructions Follow up in 4 months with labs    Thank you for choosing [facility] at [facility] to provide your oncology and hematology care.  To afford each patient quality time with our provider, please arrive at least 15 minutes before your scheduled appointment time.   If you have a lab appointment with the Cancer Center please come in thru the  Main Entrance and check in at the main information desk  You need to re-schedule your appointment should you arrive 10 or more minutes late.  We strive to give you quality time with our providers, and arriving late affects you and other patients whose appointments are after yours.  Also, if you no show three or more times for appointments you may be dismissed from the clinic at the providers discretion.     Again, thank you for choosing [facility].  Our hope is that these requests will decrease the amount of time that you wait before being seen by our physicians.       _____________________________________________________________  Should you have questions after your visit to  Cancer Center, please contact our office at [phone] between the hours of 8:00 a.m. and 4:30 p.m.  Voicemails left after 4:00 p.m. will not be returned until the following business day.  For prescription refill requests, have your pharmacy contact our office and allow 72 hours.    Cancer Center Support Programs:   > Cancer Support Group  2nd Tuesday of the month 1pm-2pm, Journey Room    

## 2018-10-07 NOTE — Progress Notes (Signed)
[place], [place] [address]   CLINIC:  Medical Oncology/Hematology  PCP:  Sharion Balloon, [place] [address] [phone]   REASON FOR VISIT: Follow-up for DVT  CURRENT THERAPY: Observation   INTERVAL HISTORY:  Ms. Snedeker 68 y.o. female returns for routine follow-up for DVT.  She has been doing well since stopping her Xarelto in February.  She reports she is feeling better she has no complaints of anything. Denies any nausea, vomiting, or diarrhea. Denies any new pains. Had not noticed any recent bleeding such as epistaxis, hematuria or hematochezia. Denies recent chest pain on exertion, shortness of breath on minimal exertion, pre-syncopal episodes, or palpitations. Denies any numbness or tingling in hands or feet. Denies any recent fevers, infections, or recent hospitalizations. Patient reports appetite at 100% and energy level at 100%.  She is eating well and maintaining her weight at this time.   REVIEW OF SYSTEMS:  Review of Systems  All other systems reviewed and are negative.    PAST MEDICAL/SURGICAL HISTORY:  Past Medical History:  Diagnosis Date  . Anxiety   . COPD (chronic obstructive pulmonary disease) ([facility])   . DDD (degenerative disc disease)   . Depression   . Diabetes mellitus without complication ([facility])   . Fibromyalgia   . GERD (gastroesophageal reflux disease)   . Hypertension   . Vitamin D deficiency disease    Past Surgical History:  Procedure Laterality Date  . ABDOMINAL HYSTERECTOMY    . CHOLECYSTECTOMY    . FOOT SURGERY Bilateral   . HAND SURGERY Bilateral   . SPINE SURGERY       SOCIAL HISTORY:  Social History   Socioeconomic History  . Marital status: Married    Spouse name: Not on file  . Number of children: Not on file  . Years of education: Not on file  . Highest education level: Not on file  Occupational History  . Not on file  Social Needs  . Financial  resource strain: Not on file  . Food insecurity:    Worry: Not on file    Inability: Not on file  . Transportation needs:    Medical: Not on file    Non-medical: Not on file  Tobacco Use  . Smoking status: Former Smoker    Packs/day: 0.50    Types: Cigarettes    Last attempt to quit: 06/22/2012    Years since quitting: 6.2  . Smokeless tobacco: Never Used  . Tobacco comment: 8-10 cigarettes a day  Substance and Sexual Activity  . Alcohol use: Not on file  . Drug use: Not on file  . Sexual activity: Not on file  Lifestyle  . Physical activity:    Days per week: Not on file    Minutes per session: Not on file  . Stress: Not on file  Relationships  . Social connections:    Talks on phone: Not on file    Gets together: Not on file    Attends religious service: Not on file    Active member of club or organization: Not on file    Attends meetings of clubs or organizations: Not on file    Relationship status: Not on file  . Intimate partner violence:    Fear of current or ex partner: Not on file    Emotionally abused: Not on file    Physically abused: Not on file    Forced sexual activity: Not on  file  Other Topics Concern  . Not on file  Social History Narrative  . Not on file    FAMILY HISTORY:  Family History  Problem Relation Age of Onset  . Cancer Mother   . Heart attack Father     CURRENT MEDICATIONS:  Outpatient Encounter Medications as of 10/07/2018  Medication Sig  . ACCU-CHEK AVIVA PLUS test strip TWICE DAILY  . amLODipine (NORVASC) 5 MG tablet Take 1 tablet (5 mg total) by mouth daily.  Marland Kitchen atenolol (TENORMIN) 50 MG tablet TAKE ONE (1) TABLET EACH DAY  . Blood Glucose Monitoring Suppl (ACCU-CHEK AVIVA PLUS) W/DEVICE KIT 1 Device by Does not apply route 2 (two) times daily. Test blood sugar twice daily.dx.[id number]  . calcium-vitamin D 250-100 MG-UNIT per tablet Take 1 tablet by mouth 2 (two) times daily.  . diclofenac sodium (VOLTAREN) 1 % GEL Apply 2 g topically  4 (four) times daily.  Marland Kitchen docusate sodium (COLACE) 250 MG capsule Take 1 capsule (250 mg total) by mouth daily.  . fluticasone (FLONASE) 50 MCG/ACT nasal spray Place 2 sprays into both nostrils daily.  . Liraglutide (VICTOZA) 18 MG/3ML SOPN INJECT 1.8MCG SQ DAILY  . lisinopril-hydrochlorothiazide (PRINZIDE,ZESTORETIC) 20-12.5 MG tablet Take 2 tablets by mouth daily.  Marland Kitchen NOVOTWIST 32G X 5 MM MISC USE AS DIRECTED  . Pitavastatin Calcium (LIVALO) 2 MG TABS TAKE ONE (1) TABLET EACH DAY   No facility-administered encounter medications on file as of 10/07/2018.     ALLERGIES:  No Known Allergies   PHYSICAL EXAM:  ECOG Performance status: 1  Vitals:   10/07/18 0942  BP: (!) 150/55  Pulse: 64  Resp: 18  Temp: (!) 97.5 F (36.4 C)   Filed Weights   10/07/18 0942  Weight: 187 lb 9.6 oz (85.1 kg)    Physical Exam Constitutional:      Appearance: Normal appearance. She is normal weight.  Cardiovascular:     Rate and Rhythm: Normal rate and regular rhythm.     Heart sounds: Normal heart sounds.  Pulmonary:     Effort: Pulmonary effort is normal.     Breath sounds: Normal breath sounds.  Abdominal:     General: Bowel sounds are normal.     Palpations: Abdomen is soft.  Musculoskeletal: Normal range of motion.  Skin:    General: Skin is warm and dry.  Neurological:     Mental Status: She is alert and oriented to person, place, and time. Mental status is at baseline.  Psychiatric:        Mood and Affect: Mood normal.        Behavior: Behavior normal.        Thought Content: Thought content normal.        Judgment: Judgment normal.      LABORATORY DATA:  I have reviewed the labs as listed.  CBC    Component Value Date/Time   WBC 10.1 10/01/2018 1037   RBC 4.88 10/01/2018 1037   HGB 15.7 (H) 10/01/2018 1037   HGB 15.0 06/16/2018 1049   HCT 46.5 (H) 10/01/2018 1037   HCT 43.4 06/16/2018 1049   PLT 252 10/01/2018 1037   PLT 249 06/16/2018 1049   MCV 95.3 10/01/2018 1037    MCV 93 06/16/2018 1049   MCH 32.2 10/01/2018 1037   MCHC 33.8 10/01/2018 1037   RDW 13.2 10/01/2018 1037   RDW 12.3 06/16/2018 1049   LYMPHSABS 3.1 10/01/2018 1037   LYMPHSABS 2.3 06/16/2018 1049   MONOABS 0.8 10/01/2018  1037   EOSABS 0.2 10/01/2018 1037   EOSABS 0.2 06/16/2018 1049   BASOSABS 0.1 10/01/2018 1037   BASOSABS 0.1 06/16/2018 1049   CMP Latest Ref Rng & Units 10/01/2018 07/01/2018 06/16/2018  Glucose 70 - 99 mg/dL 119(H) 126(H) 82  BUN 8 - 23 mg/dL _0 Creatinine 0.44 - 1.00 mg/dL 0.83 1.14(H) 0.81  Sodium 135 - 145 mmol/L 140 142 139  Potassium 3.5 - 5.1 mmol/L 3.6 4.5 3.8  Chloride 98 - 111 mmol/L 100 100 100  CO2 22 - 32 mmol/L _1 Calcium 8.9 - 10.3 mg/dL 9.5 9.5 9.3  Total Protein 6.5 - 8.1 g/dL 7.3 - 7.0  Total Bilirubin 0.3 - 1.2 mg/dL 0.9 - 0.2(L)  Alkaline Phos 38 - 126 U/L 37(L) - 31(L)  AST 15 - 41 U/L 21 - 19  ALT 0 - 44 U/L 15 - 17      I personally performed a face-to-face visit.  All questions were answered to patient's stated satisfaction. Encouraged patient to call with any new concerns or questions before his next visit to the cancer center and we can certain see him sooner, if needed.     ASSESSMENT & PLAN:   History of DVT (deep vein thrombosis) 1.  Left LE DVT: - Patient was diagnosed with a DVT and 07/2015. -She had an ultrasound done on 08/10/2015 that showed left lower extremity was positive for acute thrombus of the intramuscular gastric vein, without extension into the popliteal vein. -She was placed on Xarelto and has been taking it since. -She denies a family history of clotting disorders.  She denies any previous clots.  She denies any surgery at the time of the event, or hormonal therapy, or extensive travel.  She reports she is active.  She denies any cancer history. - Work-up was done on 06/24/2018 which showed negative for factor V Leiden and prothrombin gene mutation.  She has a normal beta-2 glycoprotein AB of <9.   Normal ANA.  Lupus anticoagulant testing is positive with repeat testing recommended in 12 weeks. - Patient had bilateral LE Dopplers done on 07/10/2018 which was negative for acute or chronic DVT. - Patient desired to come off of her Xarelto.  She has been off for 3 months now.  Repeat lupus anticoagulant testing was repeated on 10/01/2018 which was negative. -Other labs on 10/01/2018 her potassium was 3.6, creatinine 0.83, hemoglobin 15.7, platelets 252, and WBC 10.1. -She is taking a baby aspirin daily. -We will continue to follow her.  She is aware if any symptoms occur she needs to notify the office or go to the ER.  She is aware of the risks of coming off her Xarelto.  2.  Pulmonary nodule and left adrenal adenoma: - Patient had a CT CAP done on 07/10/2022 unexplained weight loss.  There was a lung nodule that measures 4 mm in the right lower lobe, which is likely benign. - We will set her up for a CT of the chest in 06/2019 for follow-up.  3.  Health maintenance: - She has never undergone a colonoscopy and refuses a referral to GI at this time. -She will follow-up with her PCP. - Her last mammogram was in 2019 per the patient she is aware and will schedule this through her PCP.      Orders placed this encounter:  Orders Placed This Encounter  Procedures  . CBC with Differential/Platelet  . Comprehensive metabolic panel  . VITAMIN D 25  Hydroxy (Vit-D Deficiency, Fractures)  . Vitamin B12  . Lactate dehydrogenase      Francene Finders, FNP-C [place] [phone]

## 2019-02-03 ENCOUNTER — Inpatient Hospital Stay (HOSPITAL_COMMUNITY): Payer: Medicare Other

## 2019-02-05 ENCOUNTER — Other Ambulatory Visit: Payer: Self-pay

## 2019-02-06 ENCOUNTER — Encounter: Payer: Self-pay | Admitting: Family

## 2019-02-06 ENCOUNTER — Telehealth: Payer: Self-pay | Admitting: *Deleted

## 2019-02-06 ENCOUNTER — Ambulatory Visit (INDEPENDENT_AMBULATORY_CARE_PROVIDER_SITE_OTHER): Payer: Medicare Other | Admitting: Family

## 2019-02-06 VITALS — BP 134/68 | HR 70 | Temp 97.5°F | Ht 62.5 in | Wt 183.2 lb

## 2019-02-06 DIAGNOSIS — I10 Essential (primary) hypertension: Secondary | ICD-10-CM

## 2019-02-06 DIAGNOSIS — E0800 Diabetes mellitus due to underlying condition with hyperosmolarity without nonketotic hyperglycemic-hyperosmolar coma (NKHHC): Secondary | ICD-10-CM

## 2019-02-06 DIAGNOSIS — Z86718 Personal history of other venous thrombosis and embolism: Secondary | ICD-10-CM

## 2019-02-06 DIAGNOSIS — E1142 Type 2 diabetes mellitus with diabetic polyneuropathy: Secondary | ICD-10-CM

## 2019-02-06 DIAGNOSIS — E785 Hyperlipidemia, unspecified: Secondary | ICD-10-CM

## 2019-02-06 DIAGNOSIS — E669 Obesity, unspecified: Secondary | ICD-10-CM

## 2019-02-06 DIAGNOSIS — E1159 Type 2 diabetes mellitus with other circulatory complications: Secondary | ICD-10-CM | POA: Diagnosis not present

## 2019-02-06 DIAGNOSIS — E559 Vitamin D deficiency, unspecified: Secondary | ICD-10-CM

## 2019-02-06 DIAGNOSIS — E1169 Type 2 diabetes mellitus with other specified complication: Secondary | ICD-10-CM | POA: Diagnosis not present

## 2019-02-06 DIAGNOSIS — M25511 Pain in right shoulder: Secondary | ICD-10-CM

## 2019-02-06 DIAGNOSIS — F0390 Unspecified dementia without behavioral disturbance: Secondary | ICD-10-CM | POA: Insufficient documentation

## 2019-02-06 LAB — BAYER DCA HB A1C WAIVED: HB A1C (BAYER DCA - WAIVED): 6.5 % (ref <7.0)

## 2019-02-06 MED ORDER — NAMZARIC 28-10 MG PO CP24: 1.0000 | ORAL_CAPSULE | Freq: Every day | ORAL | 5 refills | Status: DC

## 2019-02-06 MED ORDER — TRAMADOL HCL 50 MG PO TABS: 50.0000 mg | ORAL_TABLET | Freq: Two times a day (BID) | ORAL | 0 refills | Status: AC | PRN

## 2019-02-06 MED ORDER — NAMZARIC 7 & 14 & 21 &28 -10 MG PO C4PK: 1.0000 | EXTENDED_RELEASE_CAPSULE | Freq: Every day | ORAL | 0 refills | Status: DC

## 2019-02-06 NOTE — Telephone Encounter (Signed)
Namzaric 28-10MG  er capsules Non-Preferred by pt's insurance  Generic therapeutic alternatives for this drug are: Memantine HC and Donepezil HCl

## 2019-02-06 NOTE — Progress Notes (Signed)
Subjective:    Patient ID: Holly Price, female    DOB: 06/28/1950, 68 y.o.   MRN: [id number]  Chief Complaint  Patient presents with  . Medical Management of Chronic Issues   PT presents to the office today for chronic follow up. She has a hx of DVT and was taken off her xarelto in February 2020.   She states she has noticed some slight memory loss such as forgetting things that happened a few hours ago or days in the past. She states her husband had noticed it over the last year that has worsens. She states it seems like some days or worse than others.  Hypertension This is a chronic problem. The problem has been waxing and waning since onset. Pertinent negatives include no blurred vision, headaches, malaise/fatigue, peripheral edema or shortness of breath. Risk factors for coronary artery disease include dyslipidemia, obesity, sedentary lifestyle and diabetes mellitus. The current treatment provides moderate improvement. There is no history of kidney disease, CAD/MI, CVA or heart failure.  Hyperlipidemia This is a chronic problem. The current episode started more than 1 year ago. The problem is controlled. Recent lipid tests were reviewed and are normal. Exacerbating diseases include obesity. Pertinent negatives include no shortness of breath. Current antihyperlipidemic treatment includes statins. The current treatment provides moderate improvement of lipids. Risk factors for coronary artery disease include dyslipidemia, hypertension, a sedentary lifestyle and obesity.  Diabetes She presents for her follow-up diabetic visit. She has type 2 diabetes mellitus. Her disease course has been stable. Pertinent negatives for hypoglycemia include no headaches. Pertinent negatives for diabetes include no blurred vision, no foot paresthesias and no visual change. There are no hypoglycemic complications. Symptoms are stable. There are no diabetic complications. Pertinent negatives for diabetic  complications include no CVA. Risk factors for coronary artery disease include dyslipidemia, diabetes mellitus, hypertension, sedentary lifestyle and post-menopausal. She is following a diabetic diet. Her overall blood glucose range is 130-140 mg/dl. Eye exam is not current.  Arthritis Presents for follow-up visit. She complains of pain and stiffness. The symptoms have been stable. Affected locations include the right MCP, left MCP, right knee, left knee, right foot and left foot. Her pain is at a severity of 5/10.  Diabetic Neuropathy Pt reports intermittent burning pain of 4-5 out  10. States keeping her feet elevated helps. But does not wish to start any medications for this.     Review of Systems  Constitutional: Negative for malaise/fatigue.  Eyes: Negative for blurred vision.  Respiratory: Negative for shortness of breath.   Musculoskeletal: Positive for arthritis and stiffness.  Neurological: Negative for headaches.  All other systems reviewed and are negative.      Objective:   Physical Exam Vitals signs reviewed.  Constitutional:      General: She is not in acute distress.    Appearance: She is well-developed.  HENT:     Head: Normocephalic and atraumatic.     Right Ear: Tympanic membrane normal.     Left Ear: Tympanic membrane normal.  Eyes:     Pupils: Pupils are equal, round, and reactive to light.  Neck:     Musculoskeletal: Normal range of motion and neck supple.     Thyroid: No thyromegaly.  Cardiovascular:     Rate and Rhythm: Normal rate and regular rhythm.     Heart sounds: Normal heart sounds. No murmur.  Pulmonary:     Effort: Pulmonary effort is normal. No respiratory distress.     Breath  sounds: Normal breath sounds. No wheezing.  Abdominal:     General: Bowel sounds are normal. There is no distension.     Palpations: Abdomen is soft.     Tenderness: There is no abdominal tenderness.  Musculoskeletal: Normal range of motion.        General: No  tenderness.  Skin:    General: Skin is warm and dry.  Neurological:     Mental Status: She is alert and oriented to person, place, and time.     Cranial Nerves: No cranial nerve deficit.     Deep Tendon Reflexes: Reflexes are normal and symmetric.  Psychiatric:        Behavior: Behavior normal.        Thought Content: Thought content normal.        Judgment: Judgment normal.     BP 134/68   Pulse 70   Temp (!) 97.5 F (36.4 C) (Temporal)   Ht 5' 2.5" (1.588 m)   Wt 183 lb 3.2 oz (83.1 kg)   BMI 32.97 kg/m      Assessment & Plan:  Holly Price comes in today with chief complaint of Medical Management of Chronic Issues   Diagnosis and orders addressed:  1. Hypertension associated with diabetes ([facility]) - CMP14+EGFR - CBC with Differential/Platelet  2. Diabetes mellitus due to underlying condition with hyperosmolarity without coma, without long-term current use of insulin ([facility]) - CMP14+EGFR - CBC with Differential/Platelet - Bayer DCA Hb A1c Waived - Microalbumin / creatinine urine ratio  3. Hyperlipidemia associated with type 2 diabetes mellitus ([facility]) - CMP14+EGFR - CBC with Differential/Platelet - Lipid panel  4. Diabetic polyneuropathy associated with type 2 diabetes mellitus ([facility]) - CMP14+EGFR - CBC with Differential/Platelet - Bayer DCA Hb A1c Waived  5. History of DVT (deep vein thrombosis) - CMP14+EGFR - CBC with Differential/Platelet  6. Obesity (BMI 30-39.9) - CMP14+EGFR - CBC with Differential/Platelet  7. Pain in joint of right shoulder - CMP14+EGFR - CBC with Differential/Platelet  8. Vitamin D deficiency - CMP14+EGFR - CBC with Differential/Platelet - VITAMIN D 25 Hydroxy (Vit-D Deficiency, Fractures)  9. Dementia without behavioral disturbance, unspecified dementia type ([facility]) Will start Namzaric today Follow up in 2 months  - CMP14+EGFR - CBC with Differential/Platelet - TSH - Memantine HCl-Donepezil HCl (NAMZARIC) 7 & 14 & 21 &28  -10 MG C4PK; Take 1 capsule by mouth at bedtime.  Dispense: 28 each; Refill: 0 - Memantine HCl-Donepezil HCl (NAMZARIC) 28-10 MG CP24; Take 1 capsule by mouth at bedtime.  Dispense: 30 capsule; Refill: 5   Labs pending Health Maintenance reviewed Diet and exercise encouraged  Follow up plan: 2 months    Evelina Dun, FNP

## 2019-02-06 NOTE — Patient Instructions (Signed)
Dementia Dementia is a condition that affects the way the brain functions. It often affects memory and thinking. Usually, dementia gets worse with time and cannot be reversed (progressive dementia). There are many types of dementia, including:  Alzheimer's disease. This type is the most common.  Vascular dementia. This type may happen as the result of a stroke.  Lewy body dementia. This type may happen to people who have Parkinson's disease.  Frontotemporal dementia. This type is caused by damage to nerve cells (neurons) in certain parts of the brain. Some people may be affected by more than one type of dementia. This is called mixed dementia. What are the causes? Dementia is caused by damage to cells in the brain. The area of the brain and the types of cells damaged determine the type of dementia. Usually, this damage is irreversible or cannot be undone. Some examples of irreversible causes include:  Conditions that affect the blood vessels of the brain, such as diabetes, heart disease, or blood vessel disease.  Genetic mutations. In some cases, changes in the brain may be caused by another condition and can be reversed or slowed. Some examples of reversible causes include:  Injury to the brain.  Certain medicines.  Infection, such as meningitis.  Metabolic problems, such as vitamin B12 deficiency or thyroid disease.  Pressure on the brain, such as from a tumor or blood clot. What are the signs or symptoms? Symptoms of dementia depend on the type of dementia. Common signs of dementia include problems with remembering, thinking, problem solving, decision making, and communicating. These signs develop slowly or get worse with time. This may include:  Problems remembering things.  Having trouble taking a bath or putting clothes on.  Forgetting appointments.  Forgetting to pay bills.  Difficulty planning and preparing meals.  Having trouble speaking.  Getting lost easily. How  is this diagnosed? This condition is diagnosed by a specialist (neurologist). It is diagnosed based on the history of your symptoms, your medical history, a physical exam, and tests. Tests may include:  Tests to evaluate brain function, such as memory tests, cognitive tests, and other tests.  Lab tests, such as blood or urine tests.  Imaging tests, such as a CT scan, a PET scan, or an MRI.  Genetic testing. This may be done if other family members have a diagnosis of certain types of dementia. Your health care provider will talk with you and your family, friends, or caregivers about your history and symptoms. How is this treated?  Treatment for this condition depends on the cause of the dementia. Progressive dementias, such as Alzheimer's disease, cannot be cured, but there may be treatments that help to manage symptoms. Treatment might involve taking medicines that may help to:  Control the dementia.  Slow down the progression of the dementia.  Manage symptoms. In some cases, treating the cause of your dementia can improve symptoms, reverse symptoms, or slow down how quickly your dementia becomes worse. Your health care provider can direct you to support groups, organizations, and other health care providers who can help with decisions about your care. Follow these instructions at home: Medicines  Take over-the-counter and prescription medicines only as told by your health care provider.  Use a pill organizer or pill reminder to help you manage your medicines.  Avoid taking medicines that can affect thinking, such as pain medicines or sleeping medicines. Lifestyle  Make healthy lifestyle choices. ? Be physically active as told by your health care provider. ? Do   not use any products that contain nicotine or tobacco, such as cigarettes, e-cigarettes, and chewing tobacco. If you need help quitting, ask your health care provider. ? Do not drink alcohol. ? Practice stress-management  techniques when you get stressed. ? Spend time with other people.  Make sure to get quality sleep. These tips can help you get a good night's rest: ? Avoid napping during the day. ? Keep your sleeping area dark and cool. ? Avoid exercising during the few hours before you go to bed. ? Avoid caffeine products in the evening. Eating and drinking  Drink enough fluid to keep your urine pale yellow.  Eat a healthy diet. General instructions   Work with your health care provider to determine what you need help with and what your safety needs are.  Talk with your health care provider about whether it is safe for you to drive.  If you were given a bracelet that identifies you as a person with memory loss or tracks your location, make sure to wear it at all times.  Work with your family to make important decisions, such as advance directives, medical power of attorney, or a living will.  Keep all follow-up visits as told by your health care provider. This is important. Where to find more information  Alzheimer's Association: www.alz.org  National Institute on Aging: www.nia.nih.gov/alzheimers  World Health Organization: www.who.int Contact a health care provider if:  You have any new or worsening symptoms.  You have problems with choking or swallowing. Get help right away if:  You feel depressed or sad, or feel that you want to harm yourself.  Your family members become concerned for your safety. If you ever feel like you may hurt yourself or others, or have thoughts about taking your own life, get help right away. You can go to your nearest emergency department or call:  Your local emergency services (911 in the [place]  A suicide crisis helpline, such as the National Suicide Prevention Lifeline at 1-800-273-8255. This is open 24 hours a day. Summary  Dementia is a condition that affects the way the brain functions. Dementia often affects memory and thinking.  Usually,  dementia gets worse with time and cannot be reversed (progressive dementia).  Treatment for this condition depends on the cause of the dementia.  Work with your health care provider to determine what you need help with and what your safety needs are.  Your health care provider can direct you to support groups, organizations, and other health care providers who can help with decisions about your care. This information is not intended to replace advice given to you by your health care provider. Make sure you discuss any questions you have with your health care provider. Document Released: 11/07/2000 Document Revised: 07/29/2018 Document Reviewed: 07/29/2018 Elsevier Patient Education  2020 Elsevier Inc.  

## 2019-02-07 LAB — CMP14+EGFR
ALT: 12 IU/L (ref 0–32)
AST: 15 IU/L (ref 0–40)
Albumin/Globulin Ratio: 2.2 (ref 1.2–2.2)
Albumin: 4.6 g/dL (ref 3.8–4.8)
Alkaline Phosphatase: 47 IU/L (ref 39–117)
BUN/Creatinine Ratio: 20 (ref 12–28)
BUN: 18 mg/dL (ref 8–27)
Bilirubin Total: 0.7 mg/dL (ref 0.0–1.2)
CO2: 27 mmol/L (ref 20–29)
Calcium: 9.7 mg/dL (ref 8.7–10.3)
Chloride: 98 mmol/L (ref 96–106)
Creatinine, Ser: 0.91 mg/dL (ref 0.57–1.00)
GFR calc Af Amer: 77 mL/min/{1.73_m2} (ref >59)
GFR calc non Af Amer: 66 mL/min/{1.73_m2} (ref >59)
Globulin, Total: 2.1 g/dL (ref 1.5–4.5)
Glucose: 115 mg/dL — ABNORMAL HIGH (ref 65–99)
Potassium: 4.1 mmol/L (ref 3.5–5.2)
Sodium: 141 mmol/L (ref 134–144)
Total Protein: 6.7 g/dL (ref 6.0–8.5)

## 2019-02-07 LAB — TSH: TSH: 1.11 u[IU]/mL (ref 0.450–4.500)

## 2019-02-07 LAB — CBC WITH DIFFERENTIAL/PLATELET
Basophils Absolute: 0.1 10*3/uL (ref 0.0–0.2)
Basos: 1 %
EOS (ABSOLUTE): 0.2 10*3/uL (ref 0.0–0.4)
Eos: 2 %
Hematocrit: 47.2 % — ABNORMAL HIGH (ref 34.0–46.6)
Hemoglobin: 15.6 g/dL (ref 11.1–15.9)
Immature Grans (Abs): 0 10*3/uL (ref 0.0–0.1)
Immature Granulocytes: 0 %
Lymphocytes Absolute: 2.9 10*3/uL (ref 0.7–3.1)
Lymphs: 30 %
MCH: 31.3 pg (ref 26.6–33.0)
MCHC: 33.1 g/dL (ref 31.5–35.7)
MCV: 95 fL (ref 79–97)
Monocytes Absolute: 0.9 10*3/uL (ref 0.1–0.9)
Monocytes: 9 %
Neutrophils Absolute: 5.6 10*3/uL (ref 1.4–7.0)
Neutrophils: 58 %
Platelets: 280 10*3/uL (ref 150–450)
RBC: 4.98 x10E6/uL (ref 3.77–5.28)
RDW: 12.7 % (ref 11.7–15.4)
WBC: 9.7 10*3/uL (ref 3.4–10.8)

## 2019-02-07 LAB — VITAMIN D 25 HYDROXY (VIT D DEFICIENCY, FRACTURES): Vit D, 25-Hydroxy: 95.5 ng/mL (ref 30.0–100.0)

## 2019-02-07 LAB — LIPID PANEL
Chol/HDL Ratio: 2.7 ratio (ref 0.0–4.4)
Cholesterol, Total: 157 mg/dL (ref 100–199)
HDL: 59 mg/dL (ref >39)
LDL Chol Calc (NIH): 76 mg/dL (ref 0–99)
Triglycerides: 124 mg/dL (ref 0–149)
VLDL Cholesterol Cal: 22 mg/dL (ref 5–40)

## 2019-02-07 LAB — MICROALBUMIN / CREATININE URINE RATIO
Creatinine, Urine: 269.6 mg/dL
Microalb/Creat Ratio: 5 mg/g creat (ref 0–29)
Microalbumin, Urine: 12.2 ug/mL

## 2019-02-09 ENCOUNTER — Ambulatory Visit (HOSPITAL_COMMUNITY): Payer: Medicare Other | Admitting: Nurse Practitioner

## 2019-02-10 NOTE — Telephone Encounter (Signed)
[facility] w/ [facility] Will be cheaper to prescribe dementia medications seperate Please advise

## 2019-02-12 MED ORDER — DONEPEZIL HCL 5 MG PO TABS: 5.0000 mg | ORAL_TABLET | Freq: Every day | ORAL | 1 refills | Status: DC

## 2019-02-12 MED ORDER — MEMANTINE HCL ER 7 & 14 & 21 &28 MG PO CP24: ORAL_CAPSULE | ORAL | 2 refills | Status: DC

## 2019-02-12 NOTE — Telephone Encounter (Signed)
New Prescription sent to pharmacy 

## 2019-02-16 ENCOUNTER — Telehealth: Payer: Self-pay | Admitting: *Deleted

## 2019-02-16 NOTE — Telephone Encounter (Signed)
Insurance will pay for the Namenda 10 mg BID Please advise

## 2019-02-17 ENCOUNTER — Encounter: Payer: Self-pay | Admitting: *Deleted

## 2019-02-23 ENCOUNTER — Telehealth: Payer: Self-pay | Admitting: Family

## 2019-02-23 MED ORDER — MEMANTINE HCL 10 MG PO TABS: 10.0000 mg | ORAL_TABLET | Freq: Two times a day (BID) | ORAL | 2 refills | Status: DC

## 2019-02-23 NOTE — Telephone Encounter (Signed)
Daughter in law states that Aricept is covered but Orrin Brigham is not.  Daughter in law also states that plain Namenda 10mg  BID will be covered.

## 2019-02-23 NOTE — Addendum Note (Signed)
Addended by: Evelina Dun A on: 02/23/2019 10:35 AM   Modules accepted: Orders

## 2019-02-23 NOTE — Telephone Encounter (Signed)
Prescription sent to pharmacy.

## 2019-02-23 NOTE — Telephone Encounter (Signed)
Namzaric was not covered by insurance so I sent in separate scripts of Namenda and Aricept to pharmacy. Is it still too expensive?

## 2019-02-23 NOTE — Addendum Note (Signed)
Addended by: Evelina Dun A on: 02/23/2019 10:23 AM   Modules accepted: Orders

## 2019-02-23 NOTE — Telephone Encounter (Signed)
Left detailed message.   

## 2019-02-23 NOTE — Telephone Encounter (Signed)
Patient states that Namzaric is too expensive

## 2019-03-05 ENCOUNTER — Other Ambulatory Visit: Payer: Self-pay | Admitting: *Deleted

## 2019-03-05 DIAGNOSIS — Z20828 Contact with and (suspected) exposure to other viral communicable diseases: Secondary | ICD-10-CM | POA: Diagnosis not present

## 2019-03-05 DIAGNOSIS — Z20822 Contact with and (suspected) exposure to covid-19: Secondary | ICD-10-CM

## 2019-03-07 LAB — NOVEL CORONAVIRUS, NAA: SARS-CoV-2, NAA: NOT DETECTED

## 2019-03-09 ENCOUNTER — Telehealth: Payer: Self-pay | Admitting: General Practice

## 2019-03-09 NOTE — Telephone Encounter (Signed)
Negative COVID results given. Patient results "NOT Detected." Caller expressed understanding. ° °

## 2019-03-31 ENCOUNTER — Other Ambulatory Visit: Payer: Self-pay | Admitting: Family

## 2019-04-13 ENCOUNTER — Encounter: Payer: Self-pay | Admitting: Family

## 2019-04-13 ENCOUNTER — Encounter: Payer: Medicare Other | Admitting: Family

## 2019-04-15 ENCOUNTER — Ambulatory Visit: Payer: Medicare Other | Admitting: Family

## 2019-04-16 ENCOUNTER — Encounter: Payer: Self-pay | Admitting: Family

## 2019-05-01 ENCOUNTER — Other Ambulatory Visit: Payer: Self-pay

## 2019-05-04 ENCOUNTER — Ambulatory Visit (INDEPENDENT_AMBULATORY_CARE_PROVIDER_SITE_OTHER): Payer: Medicare Other | Admitting: Family

## 2019-05-04 ENCOUNTER — Encounter: Payer: Self-pay | Admitting: Family

## 2019-05-04 ENCOUNTER — Other Ambulatory Visit: Payer: Self-pay

## 2019-05-04 VITALS — BP 159/64 | HR 63 | Temp 97.8°F | Ht 62.5 in | Wt 185.0 lb

## 2019-05-04 DIAGNOSIS — Z1212 Encounter for screening for malignant neoplasm of rectum: Secondary | ICD-10-CM

## 2019-05-04 DIAGNOSIS — Z0000 Encounter for general adult medical examination without abnormal findings: Secondary | ICD-10-CM

## 2019-05-04 DIAGNOSIS — Z1211 Encounter for screening for malignant neoplasm of colon: Secondary | ICD-10-CM

## 2019-05-04 NOTE — Patient Instructions (Signed)

## 2019-05-04 NOTE — Progress Notes (Signed)
Subjective:    Holly Price is a 68 y.o. female who presents for a Welcome to Medicare exam.  Pt is currently living at home with spouse. No complaints at this time.   Review of Systems No complaints at this time Cardiac Risk Factors include: obesity (BMI >30kg/m2)      Objective:    Today's Vitals   05/04/19 1423 05/04/19 1442  BP: (!) 153/64 (!) 159/64  Pulse: 64 63  Temp: 97.8 F (36.6 C)   TempSrc: Temporal   SpO2: 98%   Weight: 185 lb (83.9 kg)   Height: 5' 2.5" (1.588 m)   Body mass index is 33.3 kg/m.  Medications Outpatient Encounter Medications as of 05/04/2019  Medication Sig  . ACCU-CHEK AVIVA PLUS test strip TWICE DAILY  . amLODipine (NORVASC) 5 MG tablet Take 1 tablet (5 mg total) by mouth daily.  Marland Kitchen atenolol (TENORMIN) 50 MG tablet TAKE ONE (1) TABLET EACH DAY  . Blood Glucose Monitoring Suppl (ACCU-CHEK AVIVA PLUS) W/DEVICE KIT 1 Device by Does not apply route 2 (two) times daily. Test blood sugar twice daily.dx.250.0  . calcium-vitamin D 250-100 MG-UNIT per tablet Take 1 tablet by mouth 2 (two) times daily.  . diclofenac sodium (VOLTAREN) 1 % GEL Apply 2 g topically 4 (four) times daily.  Marland Kitchen docusate sodium (COLACE) 250 MG capsule Take 1 capsule (250 mg total) by mouth daily.  Marland Kitchen donepezil (ARICEPT) 5 MG tablet TAKE ONE TABLET DAILY AT BEDTIME  . fluticasone (FLONASE) 50 MCG/ACT nasal spray Place 2 sprays into both nostrils daily.  . Liraglutide (VICTOZA) 18 MG/3ML SOPN INJECT 1.8MCG SQ DAILY  . lisinopril-hydrochlorothiazide (PRINZIDE,ZESTORETIC) 20-12.5 MG tablet Take 2 tablets by mouth daily.  . memantine (NAMENDA) 10 MG tablet Take 1 tablet (10 mg total) by mouth 2 (two) times daily.  Marland Kitchen NOVOTWIST 32G X 5 MM MISC USE AS DIRECTED  . Pitavastatin Calcium (LIVALO) 2 MG TABS TAKE ONE (1) TABLET EACH DAY   No facility-administered encounter medications on file as of 05/04/2019.      History: Past Medical History:  Diagnosis Date  . Anxiety   . COPD  (chronic obstructive pulmonary disease) ([place])   . DDD (degenerative disc disease)   . Depression   . Diabetes mellitus without complication ([place])   . Fibromyalgia   . GERD (gastroesophageal reflux disease)   . Hypertension   . Vitamin D deficiency disease    Past Surgical History:  Procedure Laterality Date  . ABDOMINAL HYSTERECTOMY    . CHOLECYSTECTOMY    . FOOT SURGERY Bilateral   . HAND SURGERY Bilateral   . SPINE SURGERY      Family History  Problem Relation Age of Onset  . Cancer Mother   . Heart attack Father    Social History   Occupational History  . Not on file  Tobacco Use  . Smoking status: Former Smoker    Packs/day: 0.50    Types: Cigarettes    Quit date: 06/22/2012    Years since quitting: 6.8  . Smokeless tobacco: Never Used  . Tobacco comment: 8-10 cigarettes a day  Substance and Sexual Activity  . Alcohol use: Not on file  . Drug use: Not on file  . Sexual activity: Not on file    Tobacco Counseling Counseling given: Not Answered Comment: 8-10 cigarettes a day   Immunizations and Health Maintenance Immunization History  Administered Date(s) Administered  . Influenza, High Dose Seasonal PF 06/16/2018  . Influenza,inj,Quad PF,6+ Mos 02/25/2013,  02/24/2014, 04/15/2015  . Pneumococcal Conjugate-13 02/15/2014  . Pneumococcal Polysaccharide-23 04/29/2012, 06/16/2018  . Tdap 04/29/2012  . Zoster 11/02/2013   Health Maintenance Due  Topic Date Due  . OPHTHALMOLOGY EXAM  08/29/2016    Activities of Daily Living In your present state of health, do you have any difficulty performing the following activities: 05/04/2019  Hearing? N  Vision? N  Difficulty concentrating or making decisions? Y  Walking or climbing stairs? N  Dressing or bathing? N  Doing errands, shopping? N  Preparing Food and eating ? N  Using the Toilet? N  In the past six months, have you accidently leaked urine? N  Do you have problems with loss of bowel control? N   Managing your Medications? N  Managing your Finances? N  Housekeeping or managing your Housekeeping? N  Some recent data might be hidden    Physical Exam  WNL (optional), or other factors deemed appropriate based on the beneficiary's medical and social history and current clinical standards.  Advanced Directives:      Assessment:    This is a routine wellness examination for this patient .   Vision/Hearing screen No exam data present  Dietary issues and exercise activities discussed:  Current Exercise Habits: Home exercise routine(Walks daily.), Type of exercise: walking, Time (Minutes): 20, Frequency (Times/Week): 7, Weekly Exercise (Minutes/Week): 140, Intensity: Mild, Exercise limited by: None identified  Goals   None    Depression Screen PHQ 2/9 Scores 05/04/2019 02/06/2019 10/06/2018 07/01/2018  PHQ - 2 Score 0 0 0 0     Fall Risk Fall Risk  05/04/2019  Falls in the past year? 0    Cognitive Function: MMSE - Mini Mental State Exam 05/04/2019  Orientation to time 3  Orientation to Place 5  Registration 3  Attention/ Calculation 5  Recall 0  Language- name 2 objects 2  Language- repeat 1  Language- follow 3 step command 3  Language- read & follow direction 1  Write a sentence 1  Copy design 1  Total score 25        Patient Care Team: Sharion Balloon, FNP as PCP - General (Nurse Practitioner)     Plan:     I have personally reviewed and noted the following in the patient's chart:   . Medical and social history . Use of alcohol, tobacco or illicit drugs  . Current medications and supplements . Functional ability and status . Nutritional status . Physical activity . Advanced directives . List of other physicians . Hospitalizations, surgeries, and ER visits in previous 12 months . Vitals . Screenings to include cognitive, depression, and falls . Referrals and appointments  In addition, I have reviewed and discussed with patient certain preventive  protocols, quality metrics, and best practice recommendations. A written personalized care plan for preventive services as well as general preventive health recommendations were provided to patient.     Evelina Dun, [place] 05/04/2019

## 2019-05-05 ENCOUNTER — Ambulatory Visit: Payer: Medicare Other | Admitting: Family

## 2019-05-06 ENCOUNTER — Other Ambulatory Visit: Payer: Self-pay

## 2019-05-06 ENCOUNTER — Telehealth: Payer: Self-pay | Admitting: Family

## 2019-05-07 ENCOUNTER — Encounter: Payer: Self-pay | Admitting: Family

## 2019-05-07 ENCOUNTER — Ambulatory Visit (INDEPENDENT_AMBULATORY_CARE_PROVIDER_SITE_OTHER): Payer: Medicare Other | Admitting: Family

## 2019-05-07 VITALS — BP 140/61 | HR 62 | Temp 97.8°F | Ht 62.5 in | Wt 183.6 lb

## 2019-05-07 DIAGNOSIS — B354 Tinea corporis: Secondary | ICD-10-CM | POA: Diagnosis not present

## 2019-05-07 DIAGNOSIS — M25512 Pain in left shoulder: Secondary | ICD-10-CM | POA: Diagnosis not present

## 2019-05-07 MED ORDER — KETOCONAZOLE 2 % EX CREA: 1.0000 "application " | TOPICAL_CREAM | Freq: Every day | CUTANEOUS | 2 refills | Status: DC

## 2019-05-07 MED ORDER — DICLOFENAC SODIUM 75 MG PO TBEC: 75.0000 mg | DELAYED_RELEASE_TABLET | Freq: Two times a day (BID) | ORAL | 0 refills | Status: DC

## 2019-05-07 NOTE — Progress Notes (Signed)
Subjective:    Patient ID: Holly Price, female    DOB: 1950-09-02, 68 y.o.   MRN: [id number]  Chief Complaint  Patient presents with  . red, itching spots on right, lower leg    Rash This is a new problem. The current episode started 1 to 4 weeks ago. The problem is unchanged. Location: right calf. The rash is characterized by redness and itchiness. She was exposed to nothing. Past treatments include anti-itch cream. The treatment provided mild relief.  Shoulder Pain  The pain is present in the left shoulder. This is a new problem. The current episode started more than 1 month ago. There has been no history of extremity trauma. The problem occurs intermittently. The pain is at a severity of 7/10. The pain is moderate. She has tried acetaminophen for the symptoms. The treatment provided mild relief.      Review of Systems  Skin: Positive for rash.  All other systems reviewed and are negative.      Objective:   Physical Exam Vitals reviewed.  Constitutional:      General: She is not in acute distress.    Appearance: She is well-developed.  HENT:     Head: Normocephalic and atraumatic.  Eyes:     Pupils: Pupils are equal, round, and reactive to light.  Neck:     Thyroid: No thyromegaly.  Cardiovascular:     Rate and Rhythm: Normal rate and regular rhythm.     Heart sounds: Normal heart sounds. No murmur.  Pulmonary:     Effort: Pulmonary effort is normal. No respiratory distress.     Breath sounds: Normal breath sounds. No wheezing.  Abdominal:     General: Bowel sounds are normal. There is no distension.     Palpations: Abdomen is soft.     Tenderness: There is no abdominal tenderness.  Musculoskeletal:        General: No tenderness. Normal range of motion.     Cervical back: Normal range of motion and neck supple.     Comments: Left shoulder pain with abduction  Skin:    General: Skin is warm and dry.          Comments: approx 6 circular scaly lesions     Neurological:     Mental Status: She is alert and oriented to person, place, and time.     Cranial Nerves: No cranial nerve deficit.     Deep Tendon Reflexes: Reflexes are normal and symmetric.  Psychiatric:        Behavior: Behavior normal.        Thought Content: Thought content normal.        Judgment: Judgment normal.     BP 140/61   Pulse 62   Temp 97.8 F (36.6 C) (Temporal)   Ht 5' 2.5" (1.588 m)   Wt 183 lb 9.6 oz (83.3 kg)   SpO2 99%   BMI 33.05 kg/m      Assessment & Plan:  Holly Price comes in today with chief complaint of red, itching spots on right, lower leg   Diagnosis and orders addressed:  1. Tinea corporis Do not scratch Good hand hygiene discussed RTO if symptoms worsen or do not improve - ketoconazole (NIZORAL) 2 % cream; Apply 1 application topically daily.  Dispense: 60 g; Refill: 2   2. Acute pain of left shoulder Rest Ice ROM exercises encouraged - diclofenac (VOLTAREN) 75 MG EC tablet; Take 1 tablet (75 mg total) by mouth  2 (two) times daily.  Dispense: 30 tablet; Refill: 0  Holly Dun, FNP

## 2019-05-07 NOTE — Patient Instructions (Signed)

## 2019-05-20 LAB — COLOGUARD: Cologuard: POSITIVE — AB

## 2019-06-02 ENCOUNTER — Other Ambulatory Visit: Payer: Self-pay | Admitting: Family

## 2019-06-02 DIAGNOSIS — M25512 Pain in left shoulder: Secondary | ICD-10-CM

## 2019-06-04 ENCOUNTER — Ambulatory Visit: Payer: Medicare Other | Attending: Internal Medicine

## 2019-06-04 ENCOUNTER — Other Ambulatory Visit: Payer: Self-pay

## 2019-06-04 DIAGNOSIS — Z20822 Contact with and (suspected) exposure to covid-19: Secondary | ICD-10-CM

## 2019-06-06 ENCOUNTER — Telehealth: Payer: Self-pay | Admitting: General Practice

## 2019-06-06 LAB — NOVEL CORONAVIRUS, NAA: SARS-CoV-2, NAA: NOT DETECTED

## 2019-06-06 NOTE — Telephone Encounter (Signed)
Negative COVID results given. Patient results "NOT Detected." Caller expressed understanding. ° °

## 2019-06-08 ENCOUNTER — Other Ambulatory Visit: Payer: Self-pay | Admitting: Family

## 2019-06-08 DIAGNOSIS — E1159 Type 2 diabetes mellitus with other circulatory complications: Secondary | ICD-10-CM

## 2019-07-02 ENCOUNTER — Other Ambulatory Visit: Payer: Self-pay | Admitting: Family

## 2019-07-02 DIAGNOSIS — E1159 Type 2 diabetes mellitus with other circulatory complications: Secondary | ICD-10-CM

## 2019-07-27 ENCOUNTER — Other Ambulatory Visit: Payer: Self-pay | Admitting: Family

## 2019-07-27 DIAGNOSIS — R195 Other fecal abnormalities: Secondary | ICD-10-CM

## 2019-07-27 NOTE — Progress Notes (Signed)
Cologuard positive, referral to GI

## 2019-07-28 ENCOUNTER — Telehealth: Payer: Self-pay | Admitting: Family

## 2019-07-28 NOTE — Telephone Encounter (Signed)
aware

## 2019-07-28 NOTE — Progress Notes (Signed)
Pt aware of results and referral

## 2019-07-28 NOTE — Progress Notes (Signed)
LM for pt to call back - jhb 3/2

## 2019-07-28 NOTE — Telephone Encounter (Signed)
Pt called stating that she received a missed call from [place] yesterday. Looked at pt's chart and saw that Roselyn Reef B had tried contacting pt yesterday regarding Cologuard results. Pt would like to speak with Roselyn Reef when she is available. Margarita Mail a message to make her aware.

## 2019-07-30 ENCOUNTER — Encounter: Payer: Self-pay | Admitting: Internal Medicine

## 2019-08-20 ENCOUNTER — Encounter: Payer: Self-pay | Admitting: Gastroenterology

## 2019-08-20 NOTE — Progress Notes (Signed)
Referring Provider: Sharion Balloon, FNP Primary Care Physician:  Sharion Balloon, FNP Primary Gastroenterologist:  Dr. Gala Romney  Chief Complaint  Patient presents with  . +cologuard    never had tcs    HPI:   Holly Price is a 69 y.o. female presenting today at the request of Sharion Balloon, FNP due to positive Cologuard.  Cologuard was positive on 05/13/2019.  Reviewed most recent labs completed 02/06/2019.  CBC essentially normal, only remarkable for hematocrit 47.2 (H).  CMP unremarkable except for glucose 115 (H).  Today:  No concerns. No abdominal pain. BMs daily. No costipation or diarrhea. No blood in the stool or melena. No unintentional weight loss. No N/V, GERD symptoms, dysphagia. No family history of colon cancer. No prior colonoscopy.   Rare NSAID use. Doesn't take Voltaren daily.   BP is elevated today. States she is always anxious at the doctors office.  At home it runs up to 140/80s. Checks BP daily at home.   No fever, chills, lightheadedness, dizziness, presyncope, syncope, cold or flulike symptoms, chest pain, heart palpitations, shortness of breath, or cough.  Past Medical History:  Diagnosis Date  . Anxiety   . COPD (chronic obstructive pulmonary disease) ([place])   . DDD (degenerative disc disease)   . Depression   . Diabetes mellitus without complication ([place])   . DVT (deep venous thrombosis) ([place]) 2017   stopped Xarelto in Feb 2020  . Fibromyalgia   . GERD (gastroesophageal reflux disease)   . Hypertension   . Vitamin D deficiency disease     Past Surgical History:  Procedure Laterality Date  . ABDOMINAL HYSTERECTOMY    . CHOLECYSTECTOMY    . FOOT SURGERY Bilateral   . HAND SURGERY Bilateral   . SPINE SURGERY      Current Outpatient Medications  Medication Sig Dispense Refill  . ACCU-CHEK AVIVA PLUS test strip TWICE DAILY 100 each 0  . amLODipine (NORVASC) 5 MG tablet TAKE ONE (1) TABLET EACH DAY 90 tablet 0  . atenolol (TENORMIN) 50 MG  tablet TAKE ONE (1) TABLET EACH DAY 90 tablet 4  . Blood Glucose Monitoring Suppl (ACCU-CHEK AVIVA PLUS) W/DEVICE KIT 1 Device by Does not apply route 2 (two) times daily. Test blood sugar twice daily.dx.250.0 1 kit 1  . calcium-vitamin D 250-100 MG-UNIT per tablet Take 1 tablet by mouth 2 (two) times daily.    . diclofenac (VOLTAREN) 75 MG EC tablet TAKE ONE TABLET BY MOUTH TWICE DAILY (Patient taking differently: as needed. ) 30 tablet 0  . diclofenac sodium (VOLTAREN) 1 % GEL Apply 2 g topically 4 (four) times daily. (Patient taking differently: Apply 2 g topically as needed. ) 300 g 3  . fluticasone (FLONASE) 50 MCG/ACT nasal spray Place 2 sprays into both nostrils daily. (Patient taking differently: Place 2 sprays into both nostrils as needed. ) 16 g 6  . Liraglutide (VICTOZA) 18 MG/3ML SOPN INJECT 1.8MCG SQ DAILY 9 mL 1  . lisinopril-hydrochlorothiazide (PRINZIDE,ZESTORETIC) 20-12.5 MG tablet Take 2 tablets by mouth daily. 180 tablet 0  . NOVOTWIST 32G X 5 MM MISC USE AS DIRECTED 100 each 0  . Pitavastatin Calcium (LIVALO) 2 MG TABS TAKE ONE (1) TABLET EACH DAY 90 tablet 0  . polyethylene glycol-electrolytes (TRILYTE) 420 g solution Take 4,000 mLs by mouth as directed. 4000 mL 0   No current facility-administered medications for this visit.    Allergies as of 08/21/2019  . (No Known Allergies)    Family  History  Problem Relation Age of Onset  . Cancer Mother   . Heart attack Father   . Colon cancer Neg Hx     Social History   Socioeconomic History  . Marital status: Married    Spouse name: Not on file  . Number of children: Not on file  . Years of education: Not on file  . Highest education level: Not on file  Occupational History  . Not on file  Tobacco Use  . Smoking status: Current Some Day Smoker    Packs/day: 0.50    Types: Cigarettes    Last attempt to quit: 06/22/2012    Years since quitting: 7.1  . Smokeless tobacco: Never Used  Substance and Sexual Activity    . Alcohol use: Not Currently  . Drug use: Not Currently  . Sexual activity: Not on file  Other Topics Concern  . Not on file  Social History Narrative  . Not on file   Social Determinants of Health   Financial Resource Strain:   . Difficulty of Paying Living Expenses:   Food Insecurity:   . Worried About Running Out of Food in the Last Year:   . Ran Out of Food in the Last Year:   Transportation Needs:   . Lack of Transportation (Medical):   . Lack of Transportation (Non-Medical):   Physical Activity:   . Days of Exercise per Week:   . Minutes of Exercise per Session:   Stress:   . Feeling of Stress :   Social Connections:   . Frequency of Communication with Friends and Family:   . Frequency of Social Gatherings with Friends and Family:   . Attends Religious Services:   . Active Member of Clubs or Organizations:   . Attends Club or Organization Meetings:   . Marital Status:   Intimate Partner Violence:   . Fear of Current or Ex-Partner:   . Emotionally Abused:   . Physically Abused:   . Sexually Abused:     Review of Systems: Gen: See HPI CV: See HPI Resp: See HPI GI: See HPI GU : Denies urinary burning, urinary frequency, urinary hesitancy MS: Denies joint pain.  Derm: Denies rash.  Psych: Denies depression or anxiety.  Heme: Denies bruising or bleeding.   Physical Exam: BP (!) 184/82   Pulse 84   Temp (!) 96.9 F (36.1 C) (Temporal)   Ht 5' 3" (1.6 m)   Wt 184 lb 3.2 oz (83.6 kg)   BMI 32.63 kg/m  General:   Alert and oriented. Pleasant and cooperative. Well-nourished and well-developed.  Head:  Normocephalic and atraumatic. Eyes:  Without icterus, sclera clear and conjunctiva pink.  Ears:  Normal auditory acuity. Lungs:  Clear to auscultation bilaterally. No wheezes, rales, or rhonchi. No distress.  Heart:  S1, S2 present without murmurs appreciated.  Abdomen:  +BS, soft, non-tender and non-distended. No HSM noted. No guarding or rebound. No masses  appreciated.  Rectal:  Deferred  Msk:  Symmetrical without gross deformities. Normal posture. Extremities:  Without edema. Neurologic:  Alert and  oriented x4;  grossly normal neurologically. Skin:  Intact without significant lesions or rashes. Psych: Normal mood and affect. 

## 2019-08-21 ENCOUNTER — Encounter: Payer: Self-pay | Admitting: Gastroenterology

## 2019-08-21 ENCOUNTER — Other Ambulatory Visit: Payer: Self-pay

## 2019-08-21 ENCOUNTER — Ambulatory Visit: Payer: Medicare Other | Admitting: Gastroenterology

## 2019-08-21 VITALS — BP 184/82 | HR 84 | Temp 96.9°F | Ht 63.0 in | Wt 184.2 lb

## 2019-08-21 DIAGNOSIS — R195 Other fecal abnormalities: Secondary | ICD-10-CM | POA: Diagnosis not present

## 2019-08-21 MED ORDER — PEG 3350-KCL-NA BICARB-NACL 420 G PO SOLR: 4000.0000 mL | ORAL | 0 refills | Status: DC

## 2019-08-21 NOTE — Patient Instructions (Signed)
Will be scheduled for colonoscopy in the near future with Dr. Gala Romney. 1 day prior to your procedure: Take one half dose of Victoza (0.9 mcg). Day of procedure: Do not take your morning diabetes medications.  Continue monitoring your blood sugars closely while you are on clear liquids.  If your blood sugars began to get low, correct these with sugary clear liquids.  We will follow up with you after your colonoscopy as recommended by Dr. Gala Romney as you are not currently having any other GI issues.  Should something arise in the meantime, do not hesitate to call.  Aliene Altes, PA-C [facility]

## 2019-08-21 NOTE — Assessment & Plan Note (Addendum)
69 year old female with no prior colonoscopy presenting today due to positive Cologuard on 05/13/2019.  No significant upper or lower GI symptoms.  No alarm symptoms.  No family history of colon cancer.   She needs colonoscopy for further evaluation of positive Cologuard.  Proceed with TCS with Dr. Gala Romney in the near future. The risks, benefits, and alternatives have been discussed in detail with patient. They have stated understanding and desire to proceed.  See separate instructions for diabetes medication adjustments. Follow-up as recommended at the time of colonoscopy as she is without any significant upper or lower GI symptoms.

## 2019-09-04 ENCOUNTER — Ambulatory Visit (INDEPENDENT_AMBULATORY_CARE_PROVIDER_SITE_OTHER): Payer: Medicare Other | Admitting: Family

## 2019-09-04 ENCOUNTER — Other Ambulatory Visit: Payer: Self-pay

## 2019-09-04 ENCOUNTER — Encounter: Payer: Self-pay | Admitting: Family

## 2019-09-04 VITALS — BP 149/58 | HR 68 | Temp 99.8°F | Ht 63.0 in | Wt 186.2 lb

## 2019-09-04 DIAGNOSIS — S30860A Insect bite (nonvenomous) of lower back and pelvis, initial encounter: Secondary | ICD-10-CM

## 2019-09-04 DIAGNOSIS — W57XXXA Bitten or stung by nonvenomous insect and other nonvenomous arthropods, initial encounter: Secondary | ICD-10-CM

## 2019-09-04 DIAGNOSIS — T7840XA Allergy, unspecified, initial encounter: Secondary | ICD-10-CM

## 2019-09-04 DIAGNOSIS — R220 Localized swelling, mass and lump, head: Secondary | ICD-10-CM | POA: Diagnosis not present

## 2019-09-04 MED ORDER — EPINEPHRINE 0.3 MG/0.3ML IJ SOAJ: 0.3000 mg | INTRAMUSCULAR | 0 refills | Status: DC | PRN

## 2019-09-04 MED ORDER — METHYLPREDNISOLONE ACETATE 80 MG/ML IJ SUSP
80.0000 mg | Freq: Once | INTRAMUSCULAR | Status: AC
  Administered 2019-09-04: 80 mg via INTRAMUSCULAR

## 2019-09-04 MED ORDER — PREDNISONE 10 MG (21) PO TBPK: ORAL_TABLET | ORAL | 0 refills | Status: DC

## 2019-09-04 NOTE — Progress Notes (Signed)
Subjective:    Patient ID: AHLAYAH QUAID, female    DOB: 1950-10-10, 69 y.o.   MRN: [id number]  Chief Complaint  Patient presents with  . Facial Swelling    x3 days with redness feels pressure. Has taken benadryl no effect on it   . Tick Removal    2 weeks ago on right butt cheek    HPI Pt presents to the office today with facial erythemas and swelling that started three days ago. Denies any new facial cream, chemicals, no medications, contacts with new animals or bug bites.   Denies any tongue swelling, SOB, or chest pain.   She reports she had a tick on right buttocks about 2 weeks ago. She does not believe it was attached for a few hours.    Review of Systems  Skin: Positive for rash.  All other systems reviewed and are negative.      Objective:   Physical Exam Vitals reviewed.  Constitutional:      General: She is not in acute distress.    Appearance: She is well-developed.  HENT:     Head: Normocephalic and atraumatic.     Right Ear: Tympanic membrane normal.     Left Ear: Tympanic membrane normal.  Eyes:     Pupils: Pupils are equal, round, and reactive to light.  Neck:     Thyroid: No thyromegaly.  Cardiovascular:     Rate and Rhythm: Normal rate and regular rhythm.     Heart sounds: Normal heart sounds. No murmur.  Pulmonary:     Effort: Pulmonary effort is normal. No respiratory distress.     Breath sounds: Normal breath sounds. No wheezing.  Abdominal:     General: Bowel sounds are normal. There is no distension.     Palpations: Abdomen is soft.     Tenderness: There is no abdominal tenderness.  Musculoskeletal:        General: Swelling present. No tenderness.     Cervical back: Normal range of motion and neck supple.     Comments: Swelling of bilateral eyes, erythemas on bilateral cheeks  Skin:    General: Skin is warm and dry.     Findings: Erythema present.  Neurological:     Mental Status: She is alert and oriented to person, place, and time.      Cranial Nerves: No cranial nerve deficit.     Deep Tendon Reflexes: Reflexes are normal and symmetric.  Psychiatric:        Behavior: Behavior normal.        Thought Content: Thought content normal.        Judgment: Judgment normal.        BP (!) 149/58   Pulse 68   Temp 99.8 F (37.7 C) (Temporal)   Ht 5\' 3"  (1.6 m)   Wt 186 lb 3.2 oz (84.5 kg)   SpO2 95%   BMI 32.98 kg/m       Assessment & Plan:  ANNALECIA DERIAN comes in today with chief complaint of Facial Swelling (x3 days with redness feels pressure. Has taken benadryl no effect on it ) and Tick Removal (2 weeks ago on right butt cheek)   Diagnosis and orders addressed:  1. Facial swelling - predniSONE (STERAPRED UNI-PAK 21 TAB) 10 MG (21) TBPK tablet; Use as directed  Dispense: 21 tablet; Refill: 0 - EPINEPHrine 0.3 mg/0.3 mL IJ SOAJ injection; Inject 0.3 mLs (0.3 mg total) into the muscle as needed for anaphylaxis.  Dispense: 1 each; Refill: 0 - methylPREDNISolone acetate (DEPO-MEDROL) injection 80 mg - Alpha-Gal Panel  2. Allergic reaction, initial encounter - predniSONE (STERAPRED UNI-PAK 21 TAB) 10 MG (21) TBPK tablet; Use as directed  Dispense: 21 tablet; Refill: 0 - EPINEPHrine 0.3 mg/0.3 mL IJ SOAJ injection; Inject 0.3 mLs (0.3 mg total) into the muscle as needed for anaphylaxis.  Dispense: 1 each; Refill: 0 - methylPREDNISolone acetate (DEPO-MEDROL) injection 80 mg - Alpha-Gal Panel  3. Tick bite of buttock, initial encounter - [place] mtn spotted fvr abs pnl(IgG+IgM) - Lyme Ab/Western Blot Reflex - Alpha-Gal Panel  Pt will take Benadryl 25 -50 mg every 6 hours Epi Pen given, told if any tongue swelling or SOB go to ED!! Unsure cause, if occurs again, could be lisinopril?   Evelina Dun, FNP

## 2019-09-04 NOTE — Patient Instructions (Signed)
Anaphylactic Reaction, Adult An anaphylactic reaction (anaphylaxis) is a sudden, severe allergic reaction by the body's disease-fighting system (immune system). Anaphylaxis can be life-threatening. This condition must be treated right away. Sometimes a person may need to be treated in the hospital. What are the causes? This condition is caused by exposure to a substance that you are allergic to (allergen). In response to this exposure, the body releases proteins (antibodies) and other compounds, such as histamine, into the bloodstream. This causes swelling in certain tissues and loss of blood pressure to important areas, such as the heart and lungs. Common allergens that can cause anaphylaxis include:  Foods, especially peanuts, wheat, shellfish, milk, and eggs.  Medicines.  Insect bites or stings.  Blood or parts of blood received for treatment (transfusions).  Chemicals, such as dyes, latex, and contrast material that is used for medical tests. What increases the risk? This condition is more likely to occur in people who:  Have allergies.  Have had anaphylaxis before.  Have a family history of anaphylaxis.  Have certain medical conditions, including asthma and eczema. What are the signs or symptoms? Symptoms of anaphylaxis may include:  Feeling warm in the face (flushed). This may include redness.  Itchy, red, swollen areas of skin (hives).  Swelling of the eyes, lips, face, mouth, tongue, or throat.  Difficulty breathing, speaking, or swallowing.  Noisy breathing (wheezing).  Dizziness or light-headedness.  Fainting.  Pain or cramping in the abdomen.  Vomiting.  Diarrhea. How is this diagnosed? This condition is diagnosed based on:  Your symptoms.  A physical exam.  Blood tests.  Recent history of exposure to allergens. How is this treated? If you think you are having an anaphylactic reaction, you should do the following right away:  Give yourself an  epinephrine injection using what is commonly called an auto-injector "pen" (pre-filled automatic epinephrine injection device). Your health care provider will teach you how to use an auto-injector pen.  Call for emergency help. If you use a pen, you must still get emergency medical treatment in the hospital. Treatment in the hospital may include: ? Medicines to help:  Tighten your blood vessels (epinephrine).  Relieve itching and hives (antihistamines).  Reduce swelling (corticosteroids). ? Oxygen therapy to help you breathe. ? IV fluids to keep you hydrated. Follow these instructions at home: Safety  Always keep an auto-injector pen near you. This can be lifesaving if you have a severe anaphylactic reaction. Use your auto-injector pen as told by your health care provider.  Do not drive after an anaphylactic reaction until your health care provider approves.  Make sure that you, the members of your household, and your employer know: ? What you are allergic to, so it can be avoided. ? How to use an auto-injector pen to give you an epinephrine injection.  Replace your epinephrine immediately after you use your auto-injector pen. This is important if you have another reaction.  If told by your health care provider, wear a medical alert bracelet or necklace that states your allergy.  Learn the signs of anaphylaxis so that you can recognize and treat it right away.  Work with your health care providers to make an anaphylaxis plan. Preparation is important. General instructions  If you have hives or rash: ? Use an over-the-counter antihistamine as told by your health care provider. ? Apply cold, wet cloths (cold compresses) to your skin or take baths or showers in cool water. Avoid hot water.  Take over-the-counter and prescription medicines only   as told by your health care provider.  Tell all your health care providers that you have an allergy.  Keep all follow-up visits as told by  your health care provider. This is important. How is this prevented?  Avoid allergens that have caused an anaphylactic reaction in the past.  When you are at a restaurant, tell your server that you have an allergy. If you are not sure whether a menu item contains an ingredient that you are allergic to, ask your server. Where to find more information  American Academy of Allergy, Asthma and Immunology: aaaai.org  American Academy of Pediatrics: healthychildren.org Get help right away if:  You develop symptoms of an allergic reaction. You may notice them soon after you are exposed to a substance. Symptoms may include: ? Flushed skin. ? Hives. ? Swelling of the eyes, lips, face, mouth, tongue, or throat. ? Difficulty breathing, speaking, or swallowing. ? Wheezing. ? Dizziness or light-headedness. ? Fainting. ? Pain or cramping in the abdomen. ? Vomiting. ? Diarrhea.  You used epinephrine. You need more medical care even if the medicine seems to be working. This is important because anaphylaxis may happen again within 72 hours (rebound anaphylaxis). You may need more doses of epinephrine. These symptoms may represent a serious problem that is an emergency. Do not wait to see if the symptoms will go away. Do the following right away:  Use the auto-injector pen as you have been instructed.  Get medical help. Call your local emergency services (911 in the [place] Do not drive yourself to the hospital. Summary  An anaphylactic reaction (anaphylaxis) is a sudden, severe allergic reaction by the body's disease-fighting system (immune system).  This condition can be life-threatening. If you have an anaphylactic reaction, get medical help right away.  Your health care provider may teach you how to use an auto-injector "pen" (pre-filled automatic epinephrine injection device) to give yourself a shot.  Always keep an auto-injector pen with you. This could save your life. Use it as told by  your health care provider.  If you use epinephrine, you must still get emergency medical treatment, even if the medicine seems to be working. This information is not intended to replace advice given to you by your health care provider. Make sure you discuss any questions you have with your health care provider. Document Revised: 09/05/2017 Document Reviewed: 09/05/2017 Elsevier Patient Education  2020 [employer].  

## 2019-09-04 NOTE — Addendum Note (Signed)
Addended by: Evelina Dun A on: 09/04/2019 03:11 PM   Modules accepted: Level of Service

## 2019-09-07 ENCOUNTER — Other Ambulatory Visit: Payer: Self-pay | Admitting: Family

## 2019-09-07 DIAGNOSIS — R220 Localized swelling, mass and lump, head: Secondary | ICD-10-CM

## 2019-09-07 DIAGNOSIS — T7840XA Allergy, unspecified, initial encounter: Secondary | ICD-10-CM

## 2019-09-09 LAB — ALPHA-GAL PANEL
Alpha Gal IgE*: <0.1 kU/L (ref <0.10)
Beef (Bos spp) IgE: <0.1 kU/L (ref <0.35)
Class Interpretation: 0
Class Interpretation: 0
Class Interpretation: 0
Lamb/Mutton (Ovis spp) IgE: <0.1 kU/L (ref <0.35)
Pork (Sus spp) IgE: <0.1 kU/L (ref <0.35)

## 2019-09-09 LAB — LYME AB/WESTERN BLOT REFLEX
LYME DISEASE AB, QUANT, IGM: <0.8 index (ref 0.00–0.79)
Lyme IgG/IgM Ab: <0.91 {ISR} (ref 0.00–0.90)

## 2019-09-09 LAB — ROCKY MTN SPOTTED FVR ABS PNL(IGG+IGM)
RMSF IgG: UNDETERMINED
RMSF IgM: 0.34 index (ref 0.00–0.89)

## 2019-09-09 LAB — RMSF, IGG, IFA: RMSF, IGG, IFA: <1:64 {titer}

## 2019-10-12 ENCOUNTER — Other Ambulatory Visit (HOSPITAL_COMMUNITY)
Admission: RE | Admit: 2019-10-12 | Discharge: 2019-10-12 | Disposition: A | Discharge status: Home / Self Care | Payer: Medicare Other | Source: Ambulatory Visit | Attending: Internal Medicine | Admitting: Internal Medicine

## 2019-10-12 ENCOUNTER — Other Ambulatory Visit: Payer: Self-pay

## 2019-10-12 DIAGNOSIS — Z01812 Encounter for preprocedural laboratory examination: Secondary | ICD-10-CM | POA: Diagnosis not present

## 2019-10-12 DIAGNOSIS — Z20822 Contact with and (suspected) exposure to covid-19: Secondary | ICD-10-CM | POA: Diagnosis not present

## 2019-10-12 LAB — SARS CORONAVIRUS 2 (TAT 6-24 HRS): SARS Coronavirus 2: NEGATIVE

## 2019-10-14 ENCOUNTER — Ambulatory Visit (HOSPITAL_COMMUNITY)
Admission: RE | Admit: 2019-10-14 | Discharge: 2019-10-14 | Disposition: A | Discharge status: Home / Self Care | Payer: Medicare Other | Attending: Internal Medicine | Admitting: Internal Medicine

## 2019-10-14 ENCOUNTER — Encounter (HOSPITAL_COMMUNITY)
Admission: RE | Disposition: A | Discharge status: Home / Self Care | Payer: Self-pay | Source: Home / Self Care | Attending: Internal Medicine

## 2019-10-14 ENCOUNTER — Other Ambulatory Visit: Payer: Self-pay

## 2019-10-14 ENCOUNTER — Encounter (HOSPITAL_COMMUNITY): Payer: Self-pay | Admitting: Internal Medicine

## 2019-10-14 DIAGNOSIS — Z86718 Personal history of other venous thrombosis and embolism: Secondary | ICD-10-CM | POA: Insufficient documentation

## 2019-10-14 DIAGNOSIS — Z791 Long term (current) use of non-steroidal anti-inflammatories (NSAID): Secondary | ICD-10-CM | POA: Insufficient documentation

## 2019-10-14 DIAGNOSIS — I10 Essential (primary) hypertension: Secondary | ICD-10-CM | POA: Insufficient documentation

## 2019-10-14 DIAGNOSIS — J449 Chronic obstructive pulmonary disease, unspecified: Secondary | ICD-10-CM | POA: Diagnosis not present

## 2019-10-14 DIAGNOSIS — K635 Polyp of colon: Secondary | ICD-10-CM | POA: Diagnosis not present

## 2019-10-14 DIAGNOSIS — M797 Fibromyalgia: Secondary | ICD-10-CM | POA: Diagnosis not present

## 2019-10-14 DIAGNOSIS — D175 Benign lipomatous neoplasm of intra-abdominal organs: Secondary | ICD-10-CM | POA: Insufficient documentation

## 2019-10-14 DIAGNOSIS — E119 Type 2 diabetes mellitus without complications: Secondary | ICD-10-CM | POA: Diagnosis not present

## 2019-10-14 DIAGNOSIS — K219 Gastro-esophageal reflux disease without esophagitis: Secondary | ICD-10-CM | POA: Diagnosis not present

## 2019-10-14 DIAGNOSIS — Z79899 Other long term (current) drug therapy: Secondary | ICD-10-CM | POA: Insufficient documentation

## 2019-10-14 DIAGNOSIS — K640 First degree hemorrhoids: Secondary | ICD-10-CM | POA: Insufficient documentation

## 2019-10-14 DIAGNOSIS — R195 Other fecal abnormalities: Secondary | ICD-10-CM | POA: Insufficient documentation

## 2019-10-14 DIAGNOSIS — D123 Benign neoplasm of transverse colon: Secondary | ICD-10-CM | POA: Diagnosis not present

## 2019-10-14 DIAGNOSIS — K5730 Diverticulosis of large intestine without perforation or abscess without bleeding: Secondary | ICD-10-CM | POA: Diagnosis not present

## 2019-10-14 DIAGNOSIS — D125 Benign neoplasm of sigmoid colon: Secondary | ICD-10-CM | POA: Diagnosis not present

## 2019-10-14 HISTORY — PX: POLYPECTOMY: SHX5525

## 2019-10-14 HISTORY — PX: COLONOSCOPY: SHX5424

## 2019-10-14 LAB — GLUCOSE, CAPILLARY: Glucose-Capillary: 103 mg/dL — ABNORMAL HIGH (ref 70–99)

## 2019-10-14 SURGERY — COLONOSCOPY
Anesthesia: Moderate Sedation

## 2019-10-14 MED ORDER — MIDAZOLAM HCL 5 MG/5ML IJ SOLN
INTRAMUSCULAR | Status: DC | PRN
  Administered 2019-10-14 (×3): 1 mg via INTRAVENOUS
  Administered 2019-10-14: 2 mg via INTRAVENOUS

## 2019-10-14 MED ORDER — ONDANSETRON HCL 4 MG/2ML IJ SOLN
INTRAMUSCULAR | Status: DC | PRN
  Administered 2019-10-14: 4 mg via INTRAVENOUS

## 2019-10-14 MED ORDER — MEPERIDINE HCL 100 MG/ML IJ SOLN
INTRAMUSCULAR | Status: DC | PRN
  Administered 2019-10-14: 15 mg
  Administered 2019-10-14: 25 mg

## 2019-10-14 MED ORDER — ONDANSETRON HCL 4 MG/2ML IJ SOLN
INTRAMUSCULAR | Status: AC
  Filled 2019-10-14: qty 2

## 2019-10-14 MED ORDER — MEPERIDINE HCL 50 MG/ML IJ SOLN
INTRAMUSCULAR | Status: AC
  Filled 2019-10-14: qty 1

## 2019-10-14 MED ORDER — MIDAZOLAM HCL 5 MG/5ML IJ SOLN
INTRAMUSCULAR | Status: AC
  Filled 2019-10-14: qty 10

## 2019-10-14 MED ORDER — SODIUM CHLORIDE 0.9 % IV SOLN
INTRAVENOUS | Status: DC
  Administered 2019-10-14: 1000 mL via INTRAVENOUS

## 2019-10-14 NOTE — Discharge Instructions (Signed)
Colonoscopy Discharge Instructions  Read the instructions outlined below and refer to this sheet in the next few weeks. These discharge instructions provide you with general information on caring for yourself after you leave the hospital. Your doctor may also give you specific instructions. While your treatment has been planned according to the most current medical practices available, unavoidable complications occasionally occur. If you have any problems or questions after discharge, call Dr. Gala Romney at [phone]. ACTIVITY  You may resume your regular activity, but move at a slower pace for the next 24 hours.   Take frequent rest periods for the next 24 hours.   Walking will help get rid of the air and reduce the bloated feeling in your belly (abdomen).   No driving for 24 hours (because of the medicine (anesthesia) used during the test).    Do not sign any important legal documents or operate any machinery for 24 hours (because of the anesthesia used during the test).  NUTRITION  Drink plenty of fluids.   You may resume your normal diet as instructed by your doctor.   Begin with a light meal and progress to your normal diet. Heavy or fried foods are harder to digest and may make you feel sick to your stomach (nauseated).   Avoid alcoholic beverages for 24 hours or as instructed.  MEDICATIONS  You may resume your normal medications unless your doctor tells you otherwise.  WHAT YOU CAN EXPECT TODAY  Some feelings of bloating in the abdomen.   Passage of more gas than usual.   Spotting of blood in your stool or on the toilet paper.  IF YOU HAD POLYPS REMOVED DURING THE COLONOSCOPY:  No aspirin products for 7 days or as instructed.   No alcohol for 7 days or as instructed.   Eat a soft diet for the next 24 hours.  FINDING OUT THE RESULTS OF YOUR TEST Not all test results are available during your visit. If your test results are not back during the visit, make an appointment  with your caregiver to find out the results. Do not assume everything is normal if you have not heard from your caregiver or the medical facility. It is important for you to follow up on all of your test results.  SEEK IMMEDIATE MEDICAL ATTENTION IF:  You have more than a spotting of blood in your stool.   Your belly is swollen (abdominal distention).   You are nauseated or vomiting.   You have a temperature over 101.   You have abdominal pain or discomfort that is severe or gets worse throughout the day.   Colon polyp, diverticulosis and hemorrhoid information provided.  3 polyps removed from your colon today.  Further recommendations to follow pending review of pathology report  At patient request, I called Denyse Amass at [phone] voicemail.  Left a message.   Colon Polyps  Polyps are tissue growths inside the body. Polyps can grow in many places, including the large intestine (colon). A polyp may be a round bump or a mushroom-shaped growth. You could have one polyp or several. Most colon polyps are noncancerous (benign). However, some colon polyps can become cancerous over time. Finding and removing the polyps early can help prevent this. What are the causes? The exact cause of colon polyps is not known. What increases the risk? You are more likely to develop this condition if you:  Have a family history of colon cancer or colon polyps.  Are older than 50 or older than  80 if you are African American.  Have inflammatory bowel disease, such as ulcerative colitis or Crohn's disease.  Have certain hereditary conditions, such as: ? Familial adenomatous polyposis. ? Lynch syndrome. ? Turcot syndrome. ? Peutz-Jeghers syndrome.  Are overweight.  Smoke cigarettes.  Do not get enough exercise.  Drink too much alcohol.  Eat a diet that is high in fat and red meat and low in fiber.  Had childhood cancer that was treated with abdominal radiation. What are the signs or  symptoms? Most polyps do not cause symptoms. If you have symptoms, they may include:  Blood coming from your rectum when having a bowel movement.  Blood in your stool. The stool may look dark red or black.  Abdominal pain.  A change in bowel habits, such as constipation or diarrhea. How is this diagnosed? This condition is diagnosed with a colonoscopy. This is a procedure in which a lighted, flexible scope is inserted into the anus and then passed into the colon to examine the area. Polyps are sometimes found when a colonoscopy is done as part of routine cancer screening tests. How is this treated? Treatment for this condition involves removing any polyps that are found. Most polyps can be removed during a colonoscopy. Those polyps will then be tested for cancer. Additional treatment may be needed depending on the results of testing. Follow these instructions at home: Lifestyle  Maintain a healthy weight, or lose weight if recommended by your health care provider.  Exercise every day or as told by your health care provider.  Do not use any products that contain nicotine or tobacco, such as cigarettes and e-cigarettes. If you need help quitting, ask your health care provider.  If you drink alcohol, limit how much you have: ? 0-1 drink a day for women. ? 0-2 drinks a day for men.  Be aware of how much alcohol is in your drink. In the [place] one drink equals one 12 oz bottle of beer (355 mL), one 5 oz glass of wine (148 mL), or one 1 oz shot of hard liquor (44 mL). Eating and drinking   Eat foods that are high in fiber, such as fruits, vegetables, and whole grains.  Eat foods that are high in calcium and vitamin D, such as milk, cheese, yogurt, eggs, liver, fish, and broccoli.  Limit foods that are high in fat, such as fried foods and desserts.  Limit the amount of red meat and processed meat you eat, such as hot dogs, sausage, bacon, and lunch meats. General instructions  Keep  all follow-up visits as told by your health care provider. This is important. ? This includes having regularly scheduled colonoscopies. ? Talk to your health care provider about when you need a colonoscopy. Contact a health care provider if:  You have new or worsening bleeding during a bowel movement.  You have new or increased blood in your stool.  You have a change in bowel habits.  You lose weight for no known reason. Summary  Polyps are tissue growths inside the body. Polyps can grow in many places, including the colon.  Most colon polyps are noncancerous (benign), but some can become cancerous over time.  This condition is diagnosed with a colonoscopy.  Treatment for this condition involves removing any polyps that are found. Most polyps can be removed during a colonoscopy. This information is not intended to replace advice given to you by your health care provider. Make sure you discuss any questions you have with  your health care provider. Document Revised: 08/29/2017 Document Reviewed: 08/29/2017 Elsevier Patient Education  [place].    Hemorrhoids Hemorrhoids are swollen veins in and around the rectum or anus. There are two types of hemorrhoids:  Internal hemorrhoids. These occur in the veins that are just inside the rectum. They may poke through to the outside and become irritated and painful.  External hemorrhoids. These occur in the veins that are outside the anus and can be felt as a painful swelling or hard lump near the anus. Most hemorrhoids do not cause serious problems, and they can be managed with home treatments such as diet and lifestyle changes. If home treatments do not help the symptoms, procedures can be done to shrink or remove the hemorrhoids. What are the causes? This condition is caused by increased pressure in the anal area. This pressure may result from various things, including:  Constipation.  Straining to have a bowel  movement.  Diarrhea.  Pregnancy.  Obesity.  Sitting for long periods of time.  Heavy lifting or other activity that causes you to strain.  Anal sex.  Riding a bike for a long period of time. What are the signs or symptoms? Symptoms of this condition include:  Pain.  Anal itching or irritation.  Rectal bleeding.  Leakage of stool (feces).  Anal swelling.  One or more lumps around the anus. How is this diagnosed? This condition can often be diagnosed through a visual exam. Other exams or tests may also be done, such as:  An exam that involves feeling the rectal area with a gloved hand (digital rectal exam).  An exam of the anal canal that is done using a small tube (anoscope).  A blood test, if you have lost a significant amount of blood.  A test to look inside the colon using a flexible tube with a camera on the end (sigmoidoscopy or colonoscopy). How is this treated? This condition can usually be treated at home. However, various procedures may be done if dietary changes, lifestyle changes, and other home treatments do not help your symptoms. These procedures can help make the hemorrhoids smaller or remove them completely. Some of these procedures involve surgery, and others do not. Common procedures include:  Rubber band ligation. Rubber bands are placed at the base of the hemorrhoids to cut off their blood supply.  Sclerotherapy. Medicine is injected into the hemorrhoids to shrink them.  Infrared coagulation. A type of light energy is used to get rid of the hemorrhoids.  Hemorrhoidectomy surgery. The hemorrhoids are surgically removed, and the veins that supply them are tied off.  Stapled hemorrhoidopexy surgery. The surgeon staples the base of the hemorrhoid to the rectal wall. Follow these instructions at home: Eating and drinking   Eat foods that have a lot of fiber in them, such as whole grains, beans, nuts, fruits, and vegetables.  Ask your health care  provider about taking products that have added fiber (fiber supplements).  Reduce the amount of fat in your diet. You can do this by eating low-fat dairy products, eating less red meat, and avoiding processed foods.  Drink enough fluid to keep your urine pale yellow. Managing pain and swelling   Take warm sitz baths for 20 minutes, 3-4 times a day to ease pain and discomfort. You may do this in a bathtub or using a portable sitz bath that fits over the toilet.  If directed, apply ice to the affected area. Using ice packs between sitz baths may  be helpful. ? Put ice in a plastic bag. ? Place a towel between your skin and the bag. ? Leave the ice on for 20 minutes, 2-3 times a day. General instructions  Take over-the-counter and prescription medicines only as told by your health care provider.  Use medicated creams or suppositories as told.  Get regular exercise. Ask your health care provider how much and what kind of exercise is best for you. In general, you should do moderate exercise for at least 30 minutes on most days of the week (150 minutes each week). This can include activities such as walking, biking, or yoga.  Go to the bathroom when you have the urge to have a bowel movement. Do not wait.  Avoid straining to have bowel movements.  Keep the anal area dry and clean. Use wet toilet paper or moist towelettes after a bowel movement.  Do not sit on the toilet for long periods of time. This increases blood pooling and pain.  Keep all follow-up visits as told by your health care provider. This is important. Contact a health care provider if you have:  Increasing pain and swelling that are not controlled by treatment or medicine.  Difficulty having a bowel movement, or you are unable to have a bowel movement.  Pain or inflammation outside the area of the hemorrhoids. Get help right away if you have:  Uncontrolled bleeding from your rectum. Summary  Hemorrhoids are swollen  veins in and around the rectum or anus.  Most hemorrhoids can be managed with home treatments such as diet and lifestyle changes.  Taking warm sitz baths can help ease pain and discomfort.  In severe cases, procedures or surgery can be done to shrink or remove the hemorrhoids. This information is not intended to replace advice given to you by your health care provider. Make sure you discuss any questions you have with your health care provider. Document Revised: 10/10/2018 Document Reviewed: 10/03/2017 Elsevier Patient Education  [place].   Diverticulosis  Diverticulosis is a condition that develops when small pouches (diverticula) form in the wall of the large intestine (colon). The colon is where water is absorbed and stool (feces) is formed. The pouches form when the inside layer of the colon pushes through weak spots in the outer layers of the colon. You may have a few pouches or many of them. The pouches usually do not cause problems unless they become inflamed or infected. When this happens, the condition is called diverticulitis. What are the causes? The cause of this condition is not known. What increases the risk? The following factors may make you more likely to develop this condition:  Being older than age 45. Your risk for this condition increases with age. Diverticulosis is rare among people younger than age 5. By age 84, many people have it.  Eating a low-fiber diet.  Having frequent constipation.  Being overweight.  Not getting enough exercise.  Smoking.  Taking over-the-counter pain medicines, like aspirin and ibuprofen.  Having a family history of diverticulosis. What are the signs or symptoms? In most people, there are no symptoms of this condition. If you do have symptoms, they may include:  Bloating.  Cramps in the abdomen.  Constipation or diarrhea.  Pain in the lower left side of the abdomen. How is this diagnosed? Because  diverticulosis usually has no symptoms, it is most often diagnosed during an exam for other colon problems. The condition may be diagnosed by:  Using a flexible scope  to examine the colon (colonoscopy).  Taking an X-ray of the colon after dye has been put into the colon (barium enema).  Having a CT scan. How is this treated? You may not need treatment for this condition. Your health care provider may recommend treatment to prevent problems. You may need treatment if you have symptoms or if you previously had diverticulitis. Treatment may include:  Eating a high-fiber diet.  Taking a fiber supplement.  Taking a live bacteria supplement (probiotic).  Taking medicine to relax your colon. Follow these instructions at home: Medicines  Take over-the-counter and prescription medicines only as told by your health care provider.  If told by your health care provider, take a fiber supplement or probiotic. Constipation prevention Your condition may cause constipation. To prevent or treat constipation, you may need to:  Drink enough fluid to keep your urine pale yellow.  Take over-the-counter or prescription medicines.  Eat foods that are high in fiber, such as beans, whole grains, and fresh fruits and vegetables.  Limit foods that are high in fat and processed sugars, such as fried or sweet foods.  General instructions  Try not to strain when you have a bowel movement.  Keep all follow-up visits as told by your health care provider. This is important. Contact a health care provider if you:  Have pain in your abdomen.  Have bloating.  Have cramps.  Have not had a bowel movement in 3 days. Get help right away if:  Your pain gets worse.  Your bloating becomes very bad.  You have a fever or chills, and your symptoms suddenly get worse.  You vomit.  You have bowel movements that are bloody or black.  You have bleeding from your rectum. Summary  Diverticulosis is a  condition that develops when small pouches (diverticula) form in the wall of the large intestine (colon).  You may have a few pouches or many of them.  This condition is most often diagnosed during an exam for other colon problems.  Treatment may include increasing the fiber in your diet, taking supplements, or taking medicines. This information is not intended to replace advice given to you by your health care provider. Make sure you discuss any questions you have with your health care provider. Document Revised: 12/11/2018 Document Reviewed: 12/11/2018 Elsevier Patient Education  [place].

## 2019-10-14 NOTE — H&P (Signed)
@LOGO @   Primary Care Physician:  Sharion Balloon, FNP Primary Gastroenterologist:  Dr. Gala Romney  Pre-Procedure History & Physical: HPI:  Holly Price is a 69 y.o. female here for diagnostic colonoscopy due to a positive Cologuard. No bowel symptoms.  No prior colonoscopy.  Past Medical History:  Diagnosis Date  . Anxiety   . COPD (chronic obstructive pulmonary disease) ([place])   . DDD (degenerative disc disease)   . Depression   . Diabetes mellitus without complication ([place])   . DVT (deep venous thrombosis) ([place]) 2017   stopped Xarelto in Feb 2020  . Fibromyalgia   . GERD (gastroesophageal reflux disease)   . Hypertension   . Vitamin D deficiency disease     Past Surgical History:  Procedure Laterality Date  . ABDOMINAL HYSTERECTOMY    . CHOLECYSTECTOMY    . FOOT SURGERY Bilateral   . HAND SURGERY Bilateral   . SPINE SURGERY    . TONSILLECTOMY      Prior to Admission medications   Medication Sig Start Date End Date Taking? Authorizing Provider  acetaminophen (TYLENOL) 500 MG tablet Take 1,000 mg by mouth every 6 (six) hours as needed for moderate pain or headache.   Yes [provider]  atenolol (TENORMIN) 50 MG tablet TAKE ONE (1) TABLET EACH DAY Patient taking differently: Take 50 mg by mouth daily.  01/01/17  Yes Hawks, Christy A, FNP  Calcium Citrate-Vitamin D (CALCIUM + D PO) Take 1 tablet by mouth daily.   Yes [provider]  Cholecalciferol (DIALYVITE VITAMIN D 5000) 125 MCG (5000 UT) capsule Take 15,000 Units by mouth daily.   Yes [provider]  Liraglutide (VICTOZA) 18 MG/3ML SOPN INJECT 1.8MCG SQ DAILY Patient taking differently: Inject 1.8 mg into the skin every evening.  11/22/15  Yes Hawks, Christy A, FNP  lisinopril-hydrochlorothiazide (PRINZIDE,ZESTORETIC) 20-12.5 MG tablet Take 2 tablets by mouth daily. Patient taking differently: Take 1 tablet by mouth 2 (two) times daily.  11/22/15  Yes Hawks, Christy A, FNP  Pitavastatin  Calcium (LIVALO) 2 MG TABS TAKE ONE (1) TABLET EACH DAY Patient taking differently: Take 2 mg by mouth daily.  11/22/15  Yes Hawks, Christy A, FNP  polyethylene glycol-electrolytes (TRILYTE) 420 g solution Take 4,000 mLs by mouth as directed. 08/21/19  Yes Deklyn Gibbon, Cristopher Estimable, MD  RESTASIS 0.05 % ophthalmic emulsion Place 1 drop into both eyes 2 (two) times daily as needed (dry eyes).  05/18/19  Yes [provider]  zinc gluconate 50 MG tablet Take 50 mg by mouth daily.   Yes [provider]  ACCU-CHEK AVIVA PLUS test strip TWICE DAILY 05/02/15   Evelina Dun A, FNP  amLODipine (NORVASC) 5 MG tablet TAKE ONE (1) TABLET EACH DAY Patient not taking: Reported on 10/05/2019 06/09/19   Evelina Dun A, FNP  Blood Glucose Monitoring Suppl (ACCU-CHEK AVIVA PLUS) W/DEVICE KIT 1 Device by Does not apply route 2 (two) times daily. Test blood sugar twice daily.dx.250.0 02/15/14   Evelina Dun A, FNP  diclofenac (VOLTAREN) 75 MG EC tablet TAKE ONE TABLET BY MOUTH TWICE DAILY Patient not taking: No sig reported 06/03/19   Evelina Dun A, FNP  diclofenac sodium (VOLTAREN) 1 % GEL Apply 2 g topically 4 (four) times daily. Patient taking differently: Apply 2 g topically daily as needed (pain).  11/22/15   Evelina Dun A, FNP  EPINEPHrine 0.3 mg/0.3 mL IJ SOAJ injection Inject 0.3 mLs (0.3 mg total) into the muscle as needed for anaphylaxis. 09/04/19  Evelina Dun A, FNP  NOVOTWIST 32G X 5 MM MISC USE AS DIRECTED 02/28/15   Evelina Dun A, FNP  predniSONE (STERAPRED UNI-PAK 21 TAB) 10 MG (21) TBPK tablet Use as directed Patient not taking: Reported on 10/05/2019 09/04/19   Sharion Balloon, FNP    Allergies as of 08/21/2019  . (No Known Allergies)    Family History  Problem Relation Age of Onset  . Cancer Mother   . Heart attack Father   . Colon cancer Neg Hx     Social History   Socioeconomic History  . Marital status: Married    Spouse name: Not on file  . Number of children: Not on  file  . Years of education: Not on file  . Highest education level: Not on file  Occupational History  . Not on file  Tobacco Use  . Smoking status: Current Some Day Smoker    Packs/day: 0.50    Types: Cigarettes    Last attempt to quit: 06/22/2012    Years since quitting: 7.3  . Smokeless tobacco: Never Used  Substance and Sexual Activity  . Alcohol use: Not Currently  . Drug use: Not Currently  . Sexual activity: Not on file  Other Topics Concern  . Not on file  Social History Narrative  . Not on file   Social Determinants of Health   Financial Resource Strain:   . Difficulty of Paying Living Expenses:   Food Insecurity:   . Worried About Charity fundraiser in the Last Year:   . Arboriculturist in the Last Year:   Transportation Needs:   . Film/video editor (Medical):   Marland Kitchen Lack of Transportation (Non-Medical):   Physical Activity:   . Days of Exercise per Week:   . Minutes of Exercise per Session:   Stress:   . Feeling of Stress :   Social Connections:   . Frequency of Communication with Friends and Family:   . Frequency of Social Gatherings with Friends and Family:   . Attends Religious Services:   . Active Member of Clubs or Organizations:   . Attends Archivist Meetings:   Marland Kitchen Marital Status:   Intimate Partner Violence:   . Fear of Current or Ex-Partner:   . Emotionally Abused:   Marland Kitchen Physically Abused:   . Sexually Abused:     Review of Systems: See HPI, otherwise negative ROS  Physical Exam: BP (!) 171/52   Pulse 84   Temp 97.9 F (36.6 C) (Oral)   Resp 18   Ht 5' 3"  (1.6 m)   Wt 82.6 kg   SpO2 98%   BMI 32.24 kg/m  General:   Alert,  Well-developed, well-nourished, pleasant and cooperative in NAD Neck:  Supple; no masses or thyromegaly. No significant cervical adenopathy. Lungs:  Clear throughout to auscultation.   No wheezes, crackles, or rhonchi. No acute distress. Heart:  Regular rate and rhythm; no murmurs, clicks, rubs,  or  gallops. Abdomen: Non-distended, normal bowel sounds.  Soft and nontender without appreciable mass or hepatosplenomegaly.  Pulses:  Normal pulses noted. Extremities:  Without clubbing or edema.  Impression/Plan: 69 year old lady with a positive Cologuard.  Devoid of any GI symptoms.  First ever diagnostic colonoscopy. The risks, benefits, limitations, alternatives and imponderables have been reviewed with the patient. Questions have been answered. All parties are agreeable.      Notice: This dictation was prepared with Dragon dictation along with smaller phrase technology. Any transcriptional errors  that result from this process are unintentional and may not be corrected upon review.

## 2019-10-14 NOTE — Op Note (Signed)
[facility] Patient Name: Holly Price Procedure Date: 10/14/2019 8:00 AM MRN: [id number] Date of Birth: 1950/07/27 Attending MD: Norvel Richards , MD CSN: [id number] Age: 69 Admit Type: Outpatient Procedure:                Colonoscopy Indications:              Positive Cologuard test Providers:                Norvel Richards, MD, Janeece Riggers, RN, Aram Candela Referring MD:              Medicines:                Midazolam 5 mg IV, Meperidine 40 mg IV Complications:            No immediate complications. Estimated Blood Loss:     Estimated blood loss was minimal. Procedure:                Pre-Anesthesia Assessment:                           - Prior to the procedure, a History and Physical                            was performed, and patient medications and                            allergies were reviewed. The patient's tolerance of                            previous anesthesia was also reviewed. The risks                            and benefits of the procedure and the sedation                            options and risks were discussed with the patient.                            All questions were answered, and informed consent                            was obtained. Prior Anticoagulants: The patient has                            taken no previous anticoagulant or antiplatelet                            agents. ASA Grade Assessment: II - A patient with                            mild systemic disease. After reviewing the risks  and benefits, the patient was deemed in                            satisfactory condition to undergo the procedure.                           After obtaining informed consent, the colonoscope                            was passed under direct vision. Throughout the                            procedure, the patient's blood pressure, pulse, and                            oxygen  saturations were monitored continuously. The                            [id number] scope was introduced through                            the anus and advanced to the the cecum, identified                            by appendiceal orifice and ileocecal valve. The                            colonoscopy was performed without difficulty. The                            patient tolerated the procedure well. The quality                            of the bowel preparation was adequate. Scope In: 8:19:18 AM Scope Out: 8:36:51 AM Scope Withdrawal Time: 0 hours 8 minutes 14 seconds  Total Procedure Duration: 0 hours 17 minutes 33 seconds  Findings:      The perianal and digital rectal examinations were normal.      Two semi-pedunculated polyps were found in the sigmoid colon and splenic       flexure. The polyps were 3 to 8 mm in size. These polyps were removed       with a cold snare. Resection and retrieval were complete. Estimated       blood loss was minimal. Estimated blood loss was minimal.      A 12 mm polyp was found in the sigmoid colon. The polyp was       pedunculated. The polyp was removed with a hot snare. Resection and       retrieval were complete. Estimated blood loss: none.      Scattered small-mouthed diverticula were found in the sigmoid colon. (2)       1 cm yellowish submucosal lesions in the ascending segment. Positive       pillow sign. Consistent with lipoma.      Non-bleeding internal hemorrhoids were found during retroflexion. The       hemorrhoids were mild, small and  Grade I (internal hemorrhoids that do       not prolapse).      The exam was otherwise without abnormality on direct and retroflexion       views. Impression:               - Two 3 to 8 mm polyps in the sigmoid colon and at                            the splenic flexure, removed with a cold snare.                            Resected and retrieved.                           - One 12 mm polyp in  the sigmoid colon, removed                            with a hot snare. Resected and retrieved.                           - Diverticulosis in the sigmoid colon.                           - Non-bleeding internal hemorrhoids. Colonic lipoma                            S.                           - The examination was otherwise normal on direct                            and retroflexion views. Moderate Sedation:      Moderate (conscious) sedation was administered by the endoscopy nurse       and supervised by the endoscopist. The following parameters were       monitored: oxygen saturation, heart rate, blood pressure, respiratory       rate, EKG, adequacy of pulmonary ventilation, and response to care.       Total physician intraservice time was 26 minutes. Recommendation:           - Patient has a contact number available for                            emergencies. The signs and symptoms of potential                            delayed complications were discussed with the                            patient. Return to normal activities tomorrow.                            Written discharge instructions were provided to the  patient.                           - Advance diet as tolerated.                           - Continue present medications.                           - Repeat colonoscopy date to be determined after                            pending pathology results are reviewed for                            surveillance.                           - Return to GI office (date not yet determined). Procedure Code(s):        --- Professional ---                           [phone], Colonoscopy, flexible; with removal of                            tumor(s), polyp(s), or other lesion(s) by snare                            technique                           99153, Moderate sedation; each additional 15                            minutes intraservice time                            G0500, Moderate sedation services provided by the                            same physician or other qualified health care                            professional performing a gastrointestinal                            endoscopic service that sedation supports,                            requiring the presence of an independent trained                            observer to assist in the monitoring of the                            patient's level of consciousness and physiological  status; initial 15 minutes of intra-service time;                            patient age 36 years or older (additional time may                            be reported with [phone], as appropriate) Diagnosis Code(s):        --- Professional ---                           K63.5, Polyp of colon                           K64.0, First degree hemorrhoids                           R19.5, Other fecal abnormalities                           K57.30, Diverticulosis of large intestine without                            perforation or abscess without bleeding CPT copyright 2019 American Medical Association. All rights reserved. The codes documented in this report are preliminary and upon coder review may  be revised to meet current compliance requirements. Holly Price. Holly Bohnenkamp, MD Norvel Richards, MD 10/14/2019 8:46:27 AM This report has been signed electronically. Number of Addenda: 0

## 2019-10-15 ENCOUNTER — Encounter: Payer: Self-pay | Admitting: Internal Medicine

## 2019-10-15 LAB — SURGICAL PATHOLOGY

## 2019-12-01 ENCOUNTER — Other Ambulatory Visit: Payer: Self-pay | Admitting: Family

## 2020-03-07 ENCOUNTER — Telehealth: Payer: Self-pay | Admitting: Family Medicine

## 2020-03-07 NOTE — Telephone Encounter (Signed)
Pt scheduled for 04/01/2020 with CHRISTY. That was the next available apt

## 2020-03-23 ENCOUNTER — Telehealth: Payer: Self-pay

## 2020-04-01 ENCOUNTER — Encounter: Payer: Self-pay | Admitting: Family

## 2020-04-01 ENCOUNTER — Other Ambulatory Visit: Payer: Self-pay

## 2020-04-01 ENCOUNTER — Ambulatory Visit (INDEPENDENT_AMBULATORY_CARE_PROVIDER_SITE_OTHER): Payer: Medicare Other | Admitting: Family

## 2020-04-01 VITALS — BP 140/74 | HR 77 | Temp 97.2°F | Ht 63.0 in | Wt 190.4 lb

## 2020-04-01 DIAGNOSIS — E785 Hyperlipidemia, unspecified: Secondary | ICD-10-CM

## 2020-04-01 DIAGNOSIS — I10 Essential (primary) hypertension: Secondary | ICD-10-CM | POA: Diagnosis not present

## 2020-04-01 DIAGNOSIS — Z23 Encounter for immunization: Secondary | ICD-10-CM

## 2020-04-01 DIAGNOSIS — E669 Obesity, unspecified: Secondary | ICD-10-CM

## 2020-04-01 DIAGNOSIS — E1142 Type 2 diabetes mellitus with diabetic polyneuropathy: Secondary | ICD-10-CM | POA: Diagnosis not present

## 2020-04-01 DIAGNOSIS — E1159 Type 2 diabetes mellitus with other circulatory complications: Secondary | ICD-10-CM

## 2020-04-01 DIAGNOSIS — I152 Hypertension secondary to endocrine disorders: Secondary | ICD-10-CM

## 2020-04-01 DIAGNOSIS — E1169 Type 2 diabetes mellitus with other specified complication: Secondary | ICD-10-CM

## 2020-04-01 DIAGNOSIS — E0800 Diabetes mellitus due to underlying condition with hyperosmolarity without nonketotic hyperglycemic-hyperosmolar coma (NKHHC): Secondary | ICD-10-CM

## 2020-04-01 DIAGNOSIS — Z86718 Personal history of other venous thrombosis and embolism: Secondary | ICD-10-CM

## 2020-04-01 DIAGNOSIS — F0390 Unspecified dementia without behavioral disturbance: Secondary | ICD-10-CM

## 2020-04-01 LAB — BAYER DCA HB A1C WAIVED: HB A1C (BAYER DCA - WAIVED): 6.3 % (ref <7.0)

## 2020-04-01 MED ORDER — BACLOFEN 10 MG PO TABS: 5.0000 mg | ORAL_TABLET | Freq: Three times a day (TID) | ORAL | 0 refills | Status: DC

## 2020-04-01 MED ORDER — LISINOPRIL-HYDROCHLOROTHIAZIDE 20-12.5 MG PO TABS: 1.0000 | ORAL_TABLET | Freq: Two times a day (BID) | ORAL | 1 refills | Status: DC

## 2020-04-01 MED ORDER — ATENOLOL 50 MG PO TABS: 50.0000 mg | ORAL_TABLET | Freq: Every day | ORAL | 1 refills | Status: DC

## 2020-04-01 MED ORDER — LIVALO 2 MG PO TABS: 2.0000 mg | ORAL_TABLET | Freq: Every day | ORAL | 1 refills | Status: DC

## 2020-04-01 MED ORDER — VICTOZA 18 MG/3ML ~~LOC~~ SOPN: 1.8000 mg | PEN_INJECTOR | Freq: Every evening | SUBCUTANEOUS | 2 refills | Status: DC

## 2020-04-01 NOTE — Progress Notes (Signed)
Subjective:    Patient ID: Holly Price, female    DOB: [birth year], 69 y.o.   MRN: [id number]  Chief Complaint  Patient presents with  . Diabetes    flu shot today   . Hypertension   PT presents to the office today for chronic follow up. She has a hx of DVT and was taken off her xarelto in February 2020.  She states her memory is stable at this time.  Diabetes She presents for her follow-up diabetic visit. She has type 2 diabetes mellitus. Her disease course has been stable. There are no hypoglycemic associated symptoms. Pertinent negatives for diabetes include no blurred vision and no foot paresthesias. Symptoms are stable. Pertinent negatives for diabetic complications include no CVA or heart disease. Risk factors for coronary artery disease include dyslipidemia, diabetes mellitus, hypertension, sedentary lifestyle and post-menopausal. She is following a generally healthy diet. Her overall blood glucose range is 110-130 mg/dl. An ACE inhibitor/angiotensin II receptor blocker is being taken. Eye exam is not current.  Hypertension This is a chronic problem. The current episode started more than 1 year ago. The problem has been resolved since onset. The problem is controlled. Pertinent negatives include no blurred vision, malaise/fatigue, peripheral edema or shortness of breath. Risk factors for coronary artery disease include dyslipidemia, diabetes mellitus, sedentary lifestyle and obesity. The current treatment provides moderate improvement. There is no history of CVA.  Hyperlipidemia This is a chronic problem. The current episode started more than 1 year ago. The problem is controlled. Recent lipid tests were reviewed and are normal. Exacerbating diseases include obesity. Pertinent negatives include no shortness of breath. Current antihyperlipidemic treatment includes statins. The current treatment provides moderate improvement of lipids. Risk factors for coronary artery disease include  dyslipidemia, diabetes mellitus, hypertension, a sedentary lifestyle and post-menopausal.  Arthritis Presents for follow-up visit. She complains of pain and stiffness. Affected locations include the right knee and left knee. Her pain is at a severity of 0/10.      Review of Systems  Constitutional: Negative for malaise/fatigue.  Eyes: Negative for blurred vision.  Respiratory: Negative for shortness of breath.   Musculoskeletal: Positive for arthritis and stiffness.  All other systems reviewed and are negative.      Objective:   Physical Exam Vitals reviewed.  Constitutional:      General: She is not in acute distress.    Appearance: She is well-developed. She is obese.  HENT:     Head: Normocephalic and atraumatic.     Right Ear: Tympanic membrane normal.     Left Ear: Tympanic membrane normal.  Eyes:     Pupils: Pupils are equal, round, and reactive to light.  Neck:     Thyroid: No thyromegaly.  Cardiovascular:     Rate and Rhythm: Normal rate and regular rhythm.     Heart sounds: Normal heart sounds. No murmur heard.   Pulmonary:     Effort: Pulmonary effort is normal. No respiratory distress.     Breath sounds: Normal breath sounds. No wheezing.  Abdominal:     General: Bowel sounds are normal. There is no distension.     Palpations: Abdomen is soft.     Tenderness: There is no abdominal tenderness.  Musculoskeletal:        General: No tenderness. Normal range of motion.     Cervical back: Normal range of motion and neck supple.  Skin:    General: Skin is warm and dry.  Neurological:  Mental Status: She is alert and oriented to person, place, and time.     Cranial Nerves: No cranial nerve deficit.     Deep Tendon Reflexes: Reflexes are normal and symmetric.  Psychiatric:        Behavior: Behavior normal.        Thought Content: Thought content normal.        Judgment: Judgment normal.      Diabetic Foot Exam - Simple   Simple Foot Form Diabetic Foot  exam was performed with the following findings: Yes 04/01/2020  9:49 AM  Visual Inspection No deformities, no ulcerations, no other skin breakdown bilaterally: Yes Sensation Testing Intact to touch and monofilament testing bilaterally: Yes Pulse Check Posterior Tibialis and Dorsalis pulse intact bilaterally: Yes Comments        BP 140/74   Pulse 77   Temp (!) 97.2 F (36.2 C) (Temporal)   Ht 5' 3"  (1.6 m)   Wt 190 lb 6.4 oz (86.4 kg)   SpO2 96%   BMI 33.73 kg/m   Assessment & Plan:  MELEENA MUNROE comes in today with chief complaint of Diabetes (flu shot today ) and Hypertension   Diagnosis and orders addressed:  1. Diabetes mellitus due to underlying condition with hyperosmolarity without coma, without long-term current use of insulin (HCC) - Bayer DCA Hb A1c Waived - liraglutide (VICTOZA) 18 MG/3ML SOPN; Inject 1.8 mg into the skin every evening.  Dispense: 12 mL; Refill: 2 - CMP14+EGFR - CBC with Differential/Platelet - Microalbumin / creatinine urine ratio  2. Essential hypertension - atenolol (TENORMIN) 50 MG tablet; Take 1 tablet (50 mg total) by mouth daily.  Dispense: 90 tablet; Refill: 1 - lisinopril-hydrochlorothiazide (ZESTORETIC) 20-12.5 MG tablet; Take 1 tablet by mouth 2 (two) times daily.  Dispense: 180 tablet; Refill: 1 - CMP14+EGFR - CBC with Differential/Platelet  3. Hyperlipidemia - Pitavastatin Calcium (LIVALO) 2 MG TABS; Take 1 tablet (2 mg total) by mouth daily.  Dispense: 90 tablet; Refill: 1 - CMP14+EGFR - CBC with Differential/Platelet - Lipid panel  4. Diabetic polyneuropathy associated with type 2 diabetes mellitus (HCC) - liraglutide (VICTOZA) 18 MG/3ML SOPN; Inject 1.8 mg into the skin every evening.  Dispense: 12 mL; Refill: 2 - CMP14+EGFR - CBC with Differential/Platelet - Microalbumin / creatinine urine ratio  5. Hypertension associated with diabetes ([place]) - CMP14+EGFR - CBC with Differential/Platelet  6. Hyperlipidemia  associated with type 2 diabetes mellitus (HCC) - CMP14+EGFR - CBC with Differential/Platelet - Lipid panel  7. Dementia without behavioral disturbance, unspecified dementia type (HCC) - CMP14+EGFR - CBC with Differential/Platelet  8. History of DVT (deep vein thrombosis) - CMP14+EGFR - CBC with Differential/Platelet  9. Obesity (BMI 30-39.9) - CMP14+EGFR - CBC with Differential/Platelet  10. Need for immunization against influenza - Flu Vaccine QUAD High Dose(Fluad) - CMP14+EGFR - CBC with Differential/Platelet   Labs pending Health Maintenance reviewed- Pt will schedule diabetic eye and mammogram  Diet and exercise encouraged  Follow up plan: 6 months    Evelina Dun, FNP

## 2020-04-01 NOTE — Patient Instructions (Signed)
Diabetes Mellitus and Standards of Medical Care Managing diabetes (diabetes mellitus) can be complicated. Your diabetes treatment may be managed by a team of health care providers, including:  A physician who specializes in diabetes (endocrinologist).  A nurse practitioner or physician assistant.  Nurses.  A diet and nutrition specialist (registered dietitian).  A certified diabetes educator (CDE).  An exercise specialist.  A pharmacist.  An eye doctor.  A foot specialist (podiatrist).  A dentist.  A primary care provider.  A mental health provider. Your health care providers follow guidelines to help you get the best quality of care. The following schedule is a general guideline for your diabetes management plan. Your health care providers may give you more specific instructions. Physical exams Upon being diagnosed with diabetes mellitus, and each year after that, your health care provider will ask about your medical and family history. He or she will also do a physical exam. Your exam may include:  Measuring your height, weight, and body mass index (BMI).  Checking your blood pressure. This will be done at every routine medical visit. Your target blood pressure may vary depending on your medical conditions, your age, and other factors.  Thyroid gland exam.  Skin exam.  Screening for damage to your nerves (peripheral neuropathy). This may include checking the pulse in your legs and feet and checking the level of sensation in your hands and feet.  A complete foot exam to inspect the structure and skin of your feet, including checking for cuts, bruises, redness, blisters, sores, or other problems.  Screening for blood vessel (vascular) problems, which may include checking the pulse in your legs and feet and checking your temperature. Blood tests Depending on your treatment plan and your personal needs, you may have the following tests done:  HbA1c (hemoglobin A1c). This  test provides information about blood sugar (glucose) control over the previous 2-3 months. It is used to adjust your treatment plan, if needed. This test will be done: ? At least 2 times a year, if you are meeting your treatment goals. ? 4 times a year, if you are not meeting your treatment goals or if treatment goals have changed.  Lipid testing, including total, LDL, and HDL cholesterol and triglyceride levels. ? The goal for LDL is less than 100 mg/dL (5.5 mmol/L). If you are at high risk for complications, the goal is less than 70 mg/dL (3.9 mmol/L). ? The goal for HDL is 40 mg/dL (2.2 mmol/L) or higher for men and 50 mg/dL (2.8 mmol/L) or higher for women. An HDL cholesterol of 60 mg/dL (3.3 mmol/L) or higher gives some protection against heart disease. ? The goal for triglycerides is less than 150 mg/dL (8.3 mmol/L).  Liver function tests.  Kidney function tests.  Thyroid function tests. Dental and eye exams  Visit your dentist two times a year.  If you have type 1 diabetes, your health care provider may recommend an eye exam 3-5 years after you are diagnosed, and then once a year after your first exam. ? For children with type 1 diabetes, a health care provider may recommend an eye exam when your child is age 10 or older and has had diabetes for 3-5 years. After the first exam, your child should get an eye exam once a year.  If you have type 2 diabetes, your health care provider may recommend an eye exam as soon as you are diagnosed, and then once a year after your first exam. Immunizations   The  yearly flu (influenza) vaccine is recommended for everyone 6 months or older who has diabetes.  The pneumonia (pneumococcal) vaccine is recommended for everyone 2 years or older who has diabetes. If you are 67 or older, you may get the pneumonia vaccine as a series of two separate shots.  The hepatitis B vaccine is recommended for adults shortly after being diagnosed with  diabetes.  Adults and children with diabetes should receive all other vaccines according to age-specific recommendations from the Centers for Disease Control and Prevention (CDC). Mental and emotional health Screening for symptoms of eating disorders, anxiety, and depression is recommended at the time of diagnosis and afterward as needed. If your screening shows that you have symptoms (positive screening result), you may need more evaluation and you may work with a mental health care provider. Treatment plan Your treatment plan will be reviewed at every medical visit. You and your health care provider will discuss:  How you are taking your medicines, including insulin.  Any side effects you are experiencing.  Your blood glucose target goals.  The frequency of your blood glucose monitoring.  Lifestyle habits, such as activity level as well as tobacco, alcohol, and substance use. Diabetes self-management education Your health care provider will assess how well you are monitoring your blood glucose levels and whether you are taking your insulin correctly. He or she may refer you to:  A certified diabetes educator to manage your diabetes throughout your life, starting at diagnosis.  A registered dietitian who can create or review your personal nutrition plan.  An exercise specialist who can discuss your activity level and exercise plan. Summary  Managing diabetes (diabetes mellitus) can be complicated. Your diabetes treatment may be managed by a team of health care providers.  Your health care providers follow guidelines in order to help you get the best quality of care.  Standards of care including having regular physical exams, blood tests, blood pressure monitoring, immunizations, screening tests, and education about how to manage your diabetes.  Your health care providers may also give you more specific instructions based on your individual health. This information is not intended  to replace advice given to you by your health care provider. Make sure you discuss any questions you have with your health care provider. Document Revised: 01/31/2018 Document Reviewed: 02/10/2016 Elsevier Patient Education  [place].

## 2020-04-02 LAB — LIPID PANEL
Chol/HDL Ratio: 2.8 ratio (ref 0.0–4.4)
Cholesterol, Total: 166 mg/dL (ref 100–199)
HDL: 59 mg/dL (ref >39)
LDL Chol Calc (NIH): 85 mg/dL (ref 0–99)
Triglycerides: 122 mg/dL (ref 0–149)
VLDL Cholesterol Cal: 22 mg/dL (ref 5–40)

## 2020-04-02 LAB — CMP14+EGFR
ALT: 17 IU/L (ref 0–32)
AST: 17 IU/L (ref 0–40)
Albumin/Globulin Ratio: 2 (ref 1.2–2.2)
Albumin: 4.7 g/dL (ref 3.8–4.8)
Alkaline Phosphatase: 45 IU/L (ref 44–121)
BUN/Creatinine Ratio: 23 (ref 12–28)
BUN: 19 mg/dL (ref 8–27)
Bilirubin Total: 0.4 mg/dL (ref 0.0–1.2)
CO2: 25 mmol/L (ref 20–29)
Calcium: 9.3 mg/dL (ref 8.7–10.3)
Chloride: 100 mmol/L (ref 96–106)
Creatinine, Ser: 0.81 mg/dL (ref 0.57–1.00)
GFR calc Af Amer: 88 mL/min/{1.73_m2} (ref >59)
GFR calc non Af Amer: 76 mL/min/{1.73_m2} (ref >59)
Globulin, Total: 2.3 g/dL (ref 1.5–4.5)
Glucose: 151 mg/dL — ABNORMAL HIGH (ref 65–99)
Potassium: 4.2 mmol/L (ref 3.5–5.2)
Sodium: 141 mmol/L (ref 134–144)
Total Protein: 7 g/dL (ref 6.0–8.5)

## 2020-04-02 LAB — MICROALBUMIN / CREATININE URINE RATIO
Creatinine, Urine: 199.9 mg/dL
Microalb/Creat Ratio: 7 mg/g creat (ref 0–29)
Microalbumin, Urine: 14.5 ug/mL

## 2020-04-02 LAB — CBC WITH DIFFERENTIAL/PLATELET
Basophils Absolute: 0.1 10*3/uL (ref 0.0–0.2)
Basos: 1 %
EOS (ABSOLUTE): 0.3 10*3/uL (ref 0.0–0.4)
Eos: 3 %
Hematocrit: 46.6 % (ref 34.0–46.6)
Hemoglobin: 15.6 g/dL (ref 11.1–15.9)
Immature Grans (Abs): 0 10*3/uL (ref 0.0–0.1)
Immature Granulocytes: 0 %
Lymphocytes Absolute: 2.8 10*3/uL (ref 0.7–3.1)
Lymphs: 27 %
MCH: 32.5 pg (ref 26.6–33.0)
MCHC: 33.5 g/dL (ref 31.5–35.7)
MCV: 97 fL (ref 79–97)
Monocytes Absolute: 0.9 10*3/uL (ref 0.1–0.9)
Monocytes: 9 %
Neutrophils Absolute: 6.3 10*3/uL (ref 1.4–7.0)
Neutrophils: 60 %
Platelets: 259 10*3/uL (ref 150–450)
RBC: 4.8 x10E6/uL (ref 3.77–5.28)
RDW: 12.4 % (ref 11.7–15.4)
WBC: 10.4 10*3/uL (ref 3.4–10.8)

## 2020-04-06 IMAGING — CT CT ABD-PELV W/ CM
3 of 5 series · 14 of 36 positions shown, 16 images · IV contrast (omnipaque)
Comparison: None.

CLINICAL DATA: Weight loss, history of left leg DVT, prior
cholecystectomy and hysterectomy

EXAM:
CT CHEST, ABDOMEN, AND PELVIS WITH CONTRAST
TECHNIQUE: Multidetector CT imaging of the chest, abdomen and pelvis was
performed following the standard protocol during bolus
administration of intravenous contrast.
CONTRAST:  100mL OMNIPAQUE IOHEXOL 300 MG/ML  SOLN

[Series 2: cap with · axial · 0.80mm/px · z∈[+1126,+1616]mm · 8 of 127 slices shown, 10 images]
[im 15/127  mediastinal]
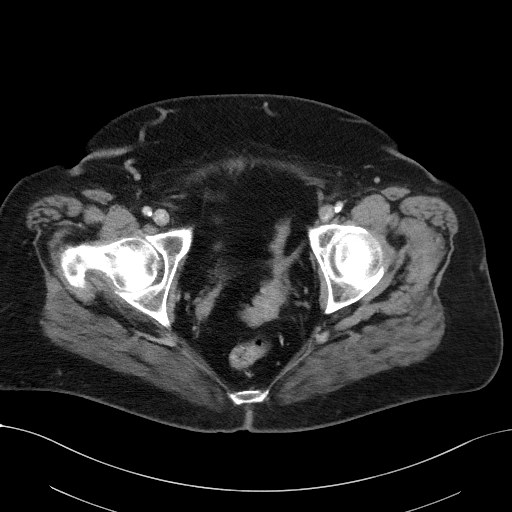
[im 15/127  lung]
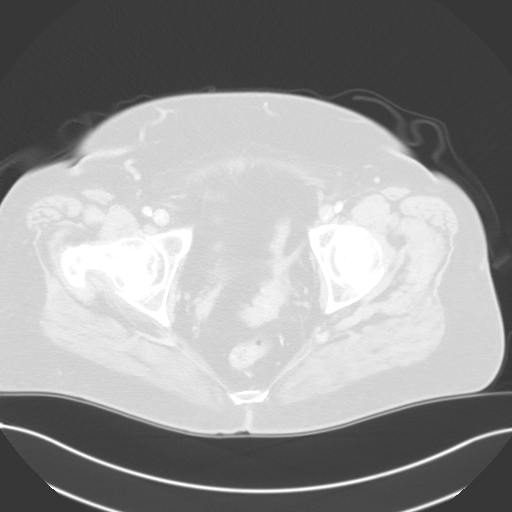
[im 29/127  lung]
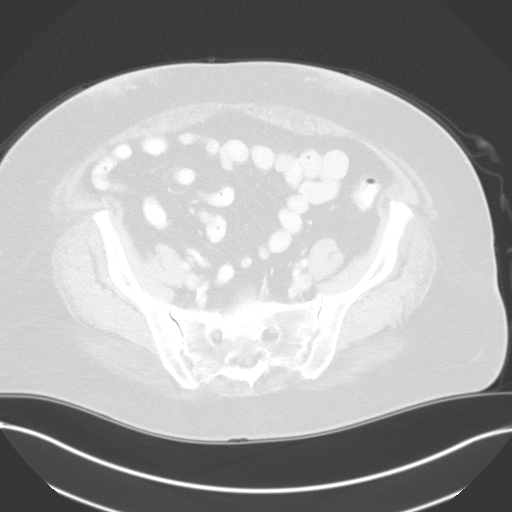
[im 43/127  lung]
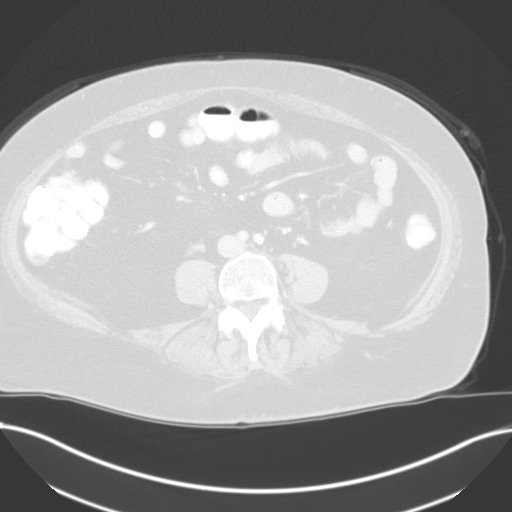
[im 57/127  lung]
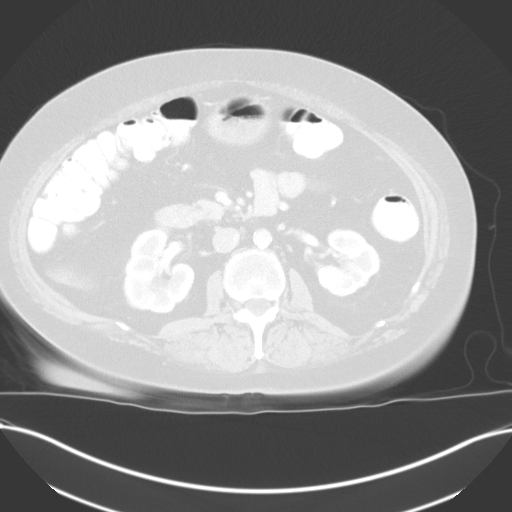
[im 71/127  mediastinal]
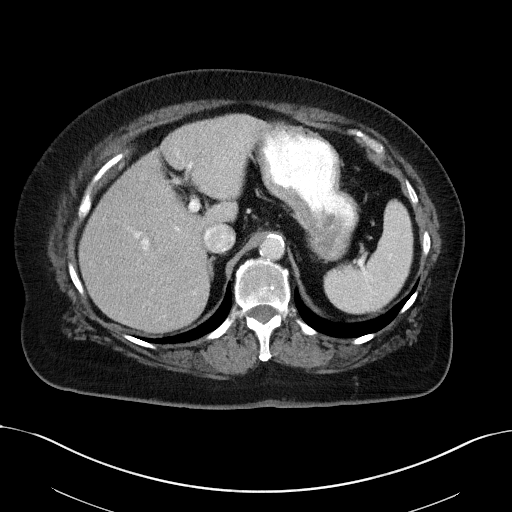
[im 71/127  lung]
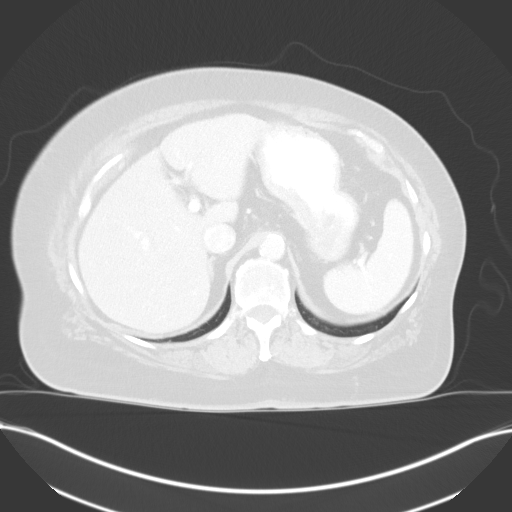
[im 85/127  lung]
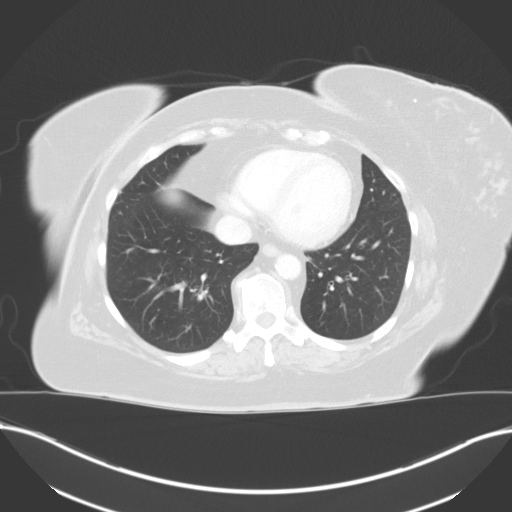
[im 99/127  lung]
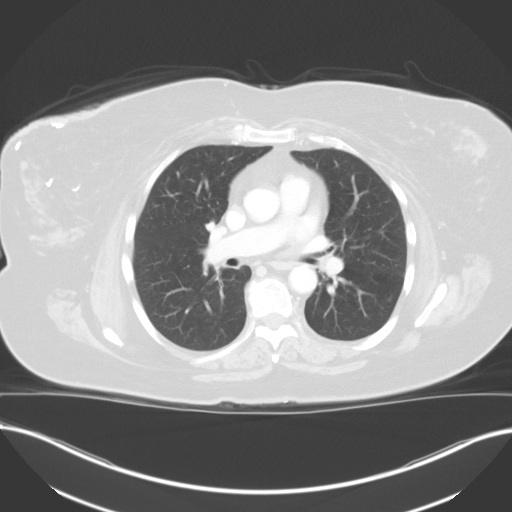
[im 113/127  lung]
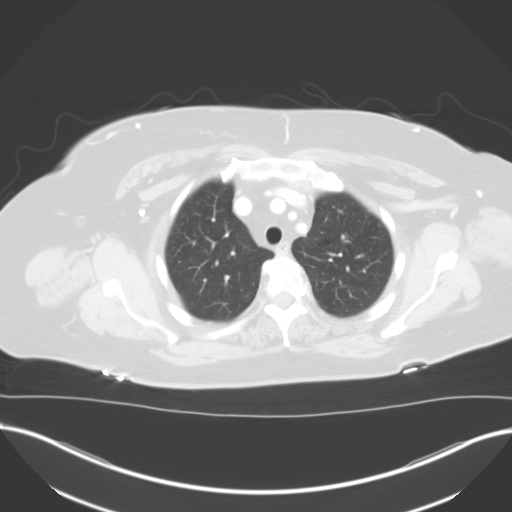

[Series 4: lung · axial · 0.80mm/px · z∈[+1386,+1460]mm · 3 of 163 slices shown]
[im 13/163  lung]
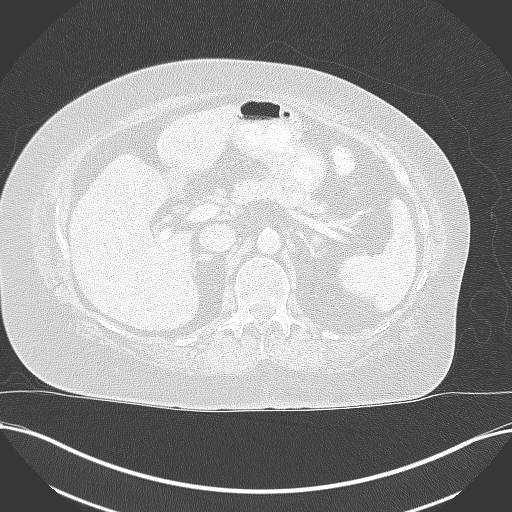
[im 38/163  lung]
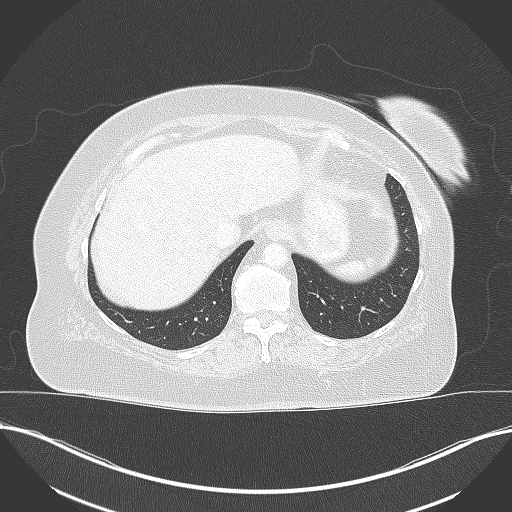
[im 50/163  lung]
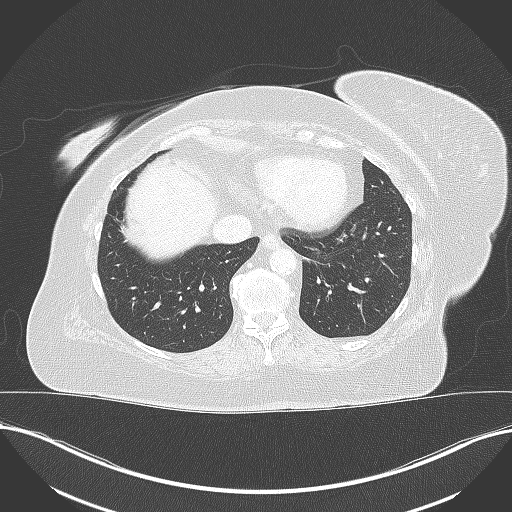

[Series 5: coronals · coronal · 0.82mm/px · 3 of 137 slices shown]
[im 28/137  lung]
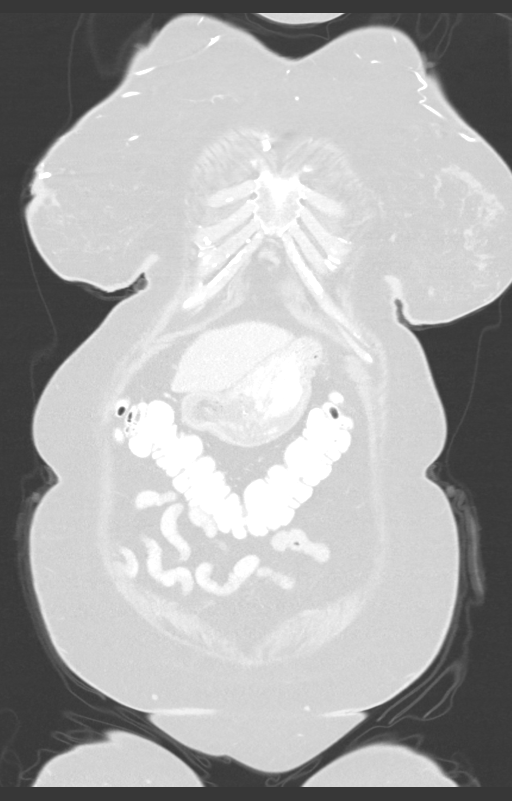
[im 55/137  lung]
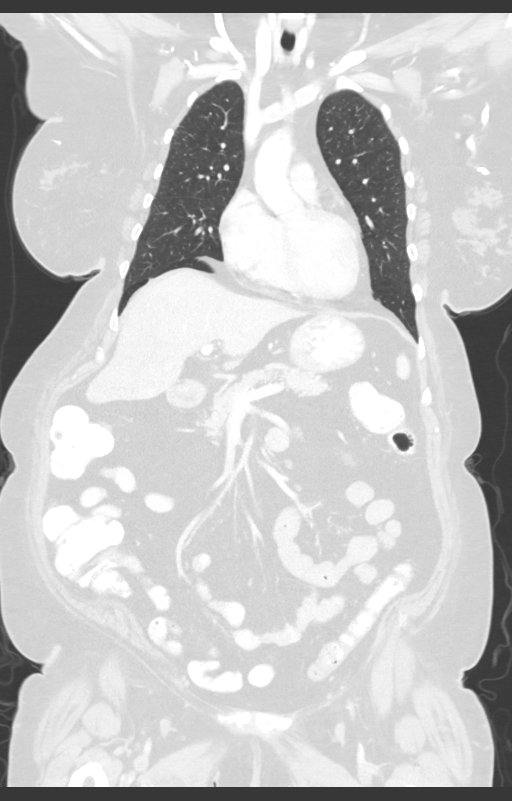
[im 82/137  lung]
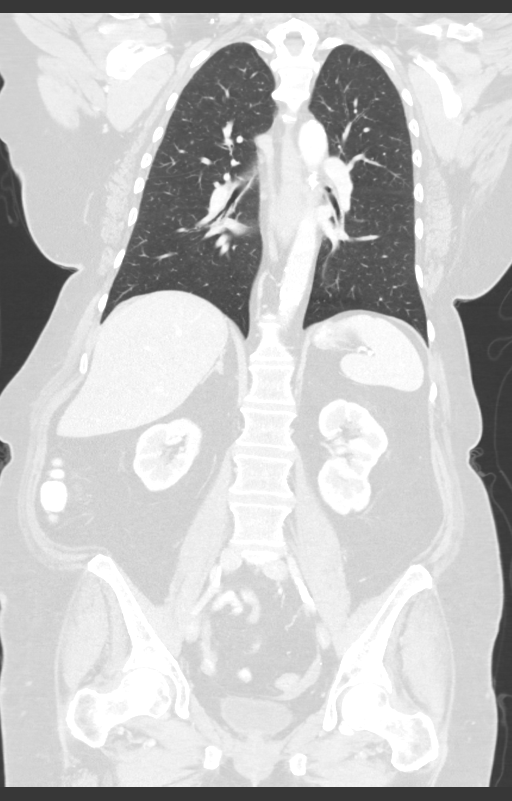

[14 of 36 positions shown; findings below may reference images not displayed]

FINDINGS: CT CHEST FINDINGS

Cardiovascular: The heart is normal in size. No pericardial
effusion.

No evidence of thoracic aortic aneurysm. Atherosclerotic
calcifications of the aortic arch.

Coronary atherosclerosis of the LAD and left circumflex.

Mediastinum/Nodes: Small mediastinal lymph nodes which do not meet
pathologic CT size criteria.

Visualized thyroid is unremarkable.

Lungs/Pleura: Mild centrilobular emphysematous changes, upper lobe
predominant.

Mild subpleural nodularity in the lungs bilaterally, measuring up to
4 mm in the right upper lobe (series 4/image 38), likely benign.

No focal consolidation.

No pleural effusion or pneumothorax.

Musculoskeletal: Mild degenerative changes of the mid thoracic
spine.

CT ABDOMEN PELVIS FINDINGS

Hepatobiliary: Liver is within normal limits.

Status post cholecystectomy. No intrahepatic or extrahepatic ductal
dilatation.

Pancreas: Within normal limits.

Spleen: Within normal limits.

Adrenals/Urinary Tract: 13 mm left adrenal nodule (series 2/image
62), measuring 10 mm on prior 9113 lumbar spine MRI, compatible with
a benign adrenal adenoma.

Right adrenal gland is within normal limits.

7 mm cyst in the posterior right lower kidney (series 3/image 34).
Left kidney is within normal limits. No hydronephrosis.

Bladder is underdistended but unremarkable.

Stomach/Bowel: Stomach is within normal limits.

No evidence of bowel obstruction.

Normal appendix (series 2/image 97).

No colonic wall thickening or mass is evident on CT.

Vascular/Lymphatic: No evidence of abdominal aortic aneurysm.

Atherosclerotic calcifications of the abdominal aorta and branch
vessels.

No suspicious abdominopelvic lymphadenopathy.

Reproductive: Status post hysterectomy.

Right ovary is within normal limits. Coarse dystrophic
calcifications in the left ovary (series 2/image 110), benign.

Other: No abdominopelvic ascites.

Subcutaneous injection sites along the anterior abdominal wall
(series 2/image 95).

Musculoskeletal: Visualized osseous structures are within normal
limits.
IMPRESSION: No findings to account for the patient's weight loss. Specifically,
no findings suspicious for malignancy in the chest, abdomen, or
pelvis.

Mild subpleural nodularity in the lungs bilaterally, measuring up to
4 mm in the right upper lobe, likely benign. Non-contrast chest CT
can be considered in 12 months in this high-risk patient. This
recommendation follows the consensus statement: Guidelines for
Management of Incidental Pulmonary Nodules Detected on CT Images:

13 mm left adrenal adenoma, benign.

Additional ancillary findings as above.

## 2020-05-04 ENCOUNTER — Ambulatory Visit (INDEPENDENT_AMBULATORY_CARE_PROVIDER_SITE_OTHER): Payer: Medicare Other | Admitting: *Deleted

## 2020-05-04 DIAGNOSIS — Z0000 Encounter for general adult medical examination without abnormal findings: Secondary | ICD-10-CM

## 2020-05-04 NOTE — Progress Notes (Addendum)
MEDICARE ANNUAL WELLNESS VISIT  05/04/2020  Telephone Visit Disclaimer This Medicare AWV was conducted by telephone due to national recommendations for restrictions regarding the COVID-19 Pandemic (e.g. social distancing).  I verified, using two identifiers, that I am speaking with Holly Price or their authorized healthcare agent. I discussed the limitations, risks, security, and privacy concerns of performing an evaluation and management service by telephone and the potential availability of an in-person appointment in the future. The patient expressed understanding and agreed to proceed.  Location of Patient: Home Location of Provider (nurse):  [facility]  Subjective:    Holly Price is a 69 y.o. female patient of Hawks, Theador Hawthorne, FNP who had a Medicare Annual Wellness Visit today via telephone. Holly Price is Retired and lives with their spouse.She has 1 son. She reports that she is socially active and does interact with friends/family regularly.She is minimally physically active and enjoys playing computer games.  Patient Care Team: Sharion Balloon, FNP as PCP - General (Nurse Practitioner)  Advanced Directives 05/04/2020 10/14/2019 10/07/2018 07/14/2018 06/24/2018 06/24/2015 04/29/2015  Does Patient Have a Medical Advance Directive? _0  No No  Type of Advance Directive - - - - - - -  Would patient like information on creating a medical advance directive? No - Patient declined Yes (MAU/Ambulatory/Procedural Areas - Information given) No - Patient declined No - Patient declined - - No - patient declined information    Hospital Utilization Over the Past 12 Months: # of hospitalizations or ER visits: 0 # of surgeries: 1 -colonoscopy  Review of Systems    Patient reports that her overall health is unchanged compared to last year.  History obtained from chart review and the patient  Patient Reported Readings (BP, Pulse, CBG, Weight,  etc) CBG-115  Pain Assessment Pain : No/denies pain     Current Medications & Allergies (verified) Allergies as of 05/04/2020   No Known Allergies     Medication List       Accurate as of May 04, 2020  3:03 PM. If you have any questions, ask your nurse or doctor.        Accu-Chek Aviva Plus test strip Generic drug: glucose blood TWICE DAILY   Accu-Chek Aviva Plus w/Device Kit 1 Device by Does not apply route 2 (two) times daily. Test blood sugar twice daily.dx.250.0   acetaminophen 500 MG tablet Commonly known as: TYLENOL Take 1,000 mg by mouth every 6 (six) hours as needed for moderate pain or headache.   atenolol 50 MG tablet Commonly known as: TENORMIN Take 1 tablet (50 mg total) by mouth daily.   baclofen 10 MG tablet Commonly known as: LIORESAL Take 0.5 tablets (5 mg total) by mouth 3 (three) times daily.   CALCIUM + D PO Take 1 tablet by mouth daily.   Dialyvite Vitamin D 5000 125 MCG (5000 UT) capsule Generic drug: Cholecalciferol Take 15,000 Units by mouth daily.   diclofenac sodium 1 % Gel Commonly known as: Voltaren Apply 2 g topically 4 (four) times daily. What changed:   when to take this  reasons to take this   EPINEPHrine 0.3 mg/0.3 mL Soaj injection Commonly known as: EPI-PEN Inject 0.3 mLs (0.3 mg total) into the muscle as needed for anaphylaxis.   lisinopril-hydrochlorothiazide 20-12.5 MG tablet Commonly known as: ZESTORETIC Take 1 tablet by mouth 2 (two) times daily.   Livalo 2 MG Tabs Generic drug: Pitavastatin Calcium Take 1 tablet (2 mg  total) by mouth daily.   NovoTwist 32G X 5 MM Misc Generic drug: Insulin Pen Needle USE AS DIRECTED   polyethylene glycol-electrolytes 420 g solution Commonly known as: TriLyte Take 4,000 mLs by mouth as directed.   Restasis 0.05 % ophthalmic emulsion Generic drug: cycloSPORINE Place 1 drop into both eyes 2 (two) times daily as needed (dry eyes).   Victoza 18 MG/3ML Sopn Generic  drug: liraglutide Inject 1.8 mg into the skin every evening.   zinc gluconate 50 MG tablet Take 50 mg by mouth daily.       History (reviewed): Past Medical History:  Diagnosis Date  . Anxiety   . COPD (chronic obstructive pulmonary disease) ([place])   . DDD (degenerative disc disease)   . Depression   . Diabetes mellitus without complication ([place])   . DVT (deep venous thrombosis) ([place]) 2017   stopped Xarelto in Feb 2020  . Fibromyalgia   . GERD (gastroesophageal reflux disease)   . Hypertension   . Vitamin D deficiency disease    Past Surgical History:  Procedure Laterality Date  . ABDOMINAL HYSTERECTOMY    . CHOLECYSTECTOMY    . COLONOSCOPY N/A 10/14/2019   Procedure: COLONOSCOPY;  Surgeon: Daneil Dolin, MD;  [facility]: [facility];  Service: Endoscopy;  Laterality: N/A;  8:30am  . FOOT SURGERY Bilateral   . HAND SURGERY Bilateral   . POLYPECTOMY  10/14/2019   Procedure: POLYPECTOMY;  Surgeon: Daneil Dolin, MD;  Location: [facility];  Service: Endoscopy;;  . SPINE SURGERY    . TONSILLECTOMY     Family History  Problem Relation Age of Onset  . Cancer Mother   . Heart attack Father   . Hypertension Son   . Heart attack Sister   . Colon cancer Neg Hx    Social History   Socioeconomic History  . Marital status: Married    Spouse name: Denyse Amass   . Number of children: 1  . Years of education: 11th  . Highest education level: 11th grade  Occupational History  . Occupation: Retired     Comment: CNA  Tobacco Use  . Smoking status: Former Smoker    Packs/day: 0.50    Types: Cigarettes    Quit date: 06/22/2012    Years since quitting: 7.8  . Smokeless tobacco: Never Used  Vaping Use  . Vaping Use: Never used  Substance and Sexual Activity  . Alcohol use: Not Currently  . Drug use: Not Currently  . Sexual activity: Not Currently  Other Topics Concern  . Not on file  Social History Narrative  . Not on file   Social Determinants of Health   Financial  Resource Strain:   . Difficulty of Paying Living Expenses: Not on file  Food Insecurity:   . Worried About Charity fundraiser in the Last Year: Not on file  . Ran Out of Food in the Last Year: Not on file  Transportation Needs:   . Lack of Transportation (Medical): Not on file  . Lack of Transportation (Non-Medical): Not on file  Physical Activity:   . Days of Exercise per Week: Not on file  . Minutes of Exercise per Session: Not on file  Stress:   . Feeling of Stress : Not on file  Social Connections:   . Frequency of Communication with Friends and Family: Not on file  . Frequency of Social Gatherings with Friends and Family: Not on file  . Attends Religious Services: Not on file  .  Active Member of Clubs or Organizations: Not on file  . Attends Archivist Meetings: Not on file  . Marital Status: Not on file    Activities of Daily Living In your present state of health, do you have any difficulty performing the following activities: 05/04/2020  Hearing? N  Vision? N  Comment Wears reading glasses  Difficulty concentrating or making decisions? N  Walking or climbing stairs? N  Dressing or bathing? N  Doing errands, shopping? N  Preparing Food and eating ? N  Using the Toilet? N  In the past six months, have you accidently leaked urine? N  Do you have problems with loss of bowel control? N  Managing your Medications? N  Managing your Finances? N  Housekeeping or managing your Housekeeping? N  Some recent data might be hidden    Patient Education/ Literacy How often do you need to have someone help you when you read instructions, pamphlets, or other written materials from your doctor or pharmacy?: 1 - Never What is the last grade level you completed in school?: 11th  Exercise Current Exercise Habits: Home exercise routine, Type of exercise: walking, Time (Minutes): 30, Frequency (Times/Week): 3, Weekly Exercise (Minutes/Week): 90, Intensity: Mild, Exercise  limited by: None identified  Diet Patient reports consuming 3 meals a day and 1 snack(s) a day Patient reports that her primary diet is: Regular, Low fat Patient reports that she does have regular access to food.   Depression Screen PHQ 2/9 Scores 05/04/2020 04/01/2020 09/04/2019 05/07/2019 05/04/2019 02/06/2019 10/06/2018  PHQ - 2 Score 0 0 0 0 0 0 0     Fall Risk Fall Risk  05/04/2020 04/01/2020 09/04/2019 05/07/2019 05/04/2019  Falls in the past year? 0 0 0 0 0     Objective:  DONDI BURANDT seemed alert and oriented and she participated appropriately during our telephone visit.  Blood Pressure Weight BMI  BP Readings from Last 3 Encounters:  04/01/20 140/74  10/14/19 (!) 104/35  09/04/19 (!) 149/58   Wt Readings from Last 3 Encounters:  04/01/20 190 lb 6.4 oz (86.4 kg)  10/14/19 182 lb (82.6 kg)  09/04/19 186 lb 3.2 oz (84.5 kg)   BMI Readings from Last 1 Encounters:  04/01/20 33.73 kg/m    *Unable to obtain current vital signs, weight, and BMI due to telephone visit type  Hearing/Vision  . Jordyn did not seem to have difficulty with hearing/understanding during the telephone conversation . Reports that she has not had a formal eye exam by an eye care professional within the past year . Reports that she has not had a formal hearing evaluation within the past year *Unable to fully assess hearing and vision during telephone visit type  Cognitive Function: 6CIT Screen 05/04/2020  What Year? 0 points  What month? 0 points  What time? 0 points  Count back from 20 0 points  Months in reverse 2 points  Repeat phrase 0 points  Total Score 2   (Normal:0-7, Significant for Dysfunction: >8)  Normal Cognitive Function Screening: Yes   Immunization & Health Maintenance Record Immunization History  Administered Date(s) Administered  . Fluad Quad(high Dose 65+) 04/01/2020  . Influenza, High Dose Seasonal PF 06/16/2018  . Influenza,inj,Quad PF,6+ Mos 02/25/2013, 02/24/2014,  04/15/2015  . Moderna SARS-COVID-2 Vaccination 07/22/2019, 08/19/2019  . Pneumococcal Conjugate-13 02/15/2014  . Pneumococcal Polysaccharide-23 04/29/2012, 06/16/2018  . Tdap 04/29/2012  . Zoster 11/02/2013    Health Maintenance  Topic Date Due  . OPHTHALMOLOGY EXAM  08/29/2016  . MAMMOGRAM  04/01/2021 (Originally 05/28/2013)  . HEMOGLOBIN A1C  09/29/2020  . FOOT EXAM  04/01/2021  . TETANUS/TDAP  04/29/2022  . COLONOSCOPY  10/13/2029  . INFLUENZA VACCINE  Completed  . DEXA SCAN  Completed  . COVID-19 Vaccine  Completed  . Hepatitis C Screening  Completed  . PNA vac Low Risk Adult  Completed       Assessment  This is a routine wellness examination for Holly Price.  Health Maintenance: Due or Overdue Health Maintenance Due  Topic Date Due  . OPHTHALMOLOGY EXAM  08/29/2016    Holly Price does not need a referral for Community Assistance: Care Management:   no Social Work:    not applicable Prescription Assistance:  no Nutrition/Diabetes Education:  no   Plan:  Personalized Goals Goals Addressed            This Visit's Progress   . AWV       05/04/2020 AWV Goal: Diabetes Management  . Patient will maintain an A1C level below 8.0 . Patient will not develop any diabetic foot complications . Patient will not experience any hypoglycemic episodes over the next 3 months . Patient will notify our office of any CBG readings outside of the provider recommended range by calling [phone] . Patient will adhere to provider recommendations for diabetes management  Patient Self Management Activities . take all medications as prescribed and report any negative side effects . monitor and record blood sugar readings as directed . adhere to a low carbohydrate diet that incorporates lean proteins, vegetables, whole grains, low glycemic fruits . check feet daily noting any sores, cracks, injuries, or callous formations . see PCP or podiatrist if she notices any changes  in her legs, feet, or toenails . Patient will visit PCP and have an A1C level checked every 3 to 6 months as directed  . have a yearly eye exam to monitor for vascular changes associated with diabetes and will request that the report be sent to her pcp.  . consult with her PCP regarding any changes in her health or new or worsening symptoms   05/04/2020 AWV Goal: Exercise for General Health   Patient will verbalize understanding of the benefits of increased physical activity:  Exercising regularly is important. It will improve your overall fitness, flexibility, and endurance.  Regular exercise also will improve your overall health. It can help you control your weight, reduce stress, and improve your bone density.  Over the next year, patient will increase physical activity as tolerated with a goal of at least 150 minutes of moderate physical activity per week.   You can tell that you are exercising at a moderate intensity if your heart starts beating faster and you start breathing faster but can still hold a conversation.  Moderate-intensity exercise ideas include:  Walking 1 mile (1.6 km) in about 15 minutes  Biking  Hiking  Golfing  Dancing  Water aerobics  Patient will verbalize understanding of everyday activities that increase physical activity by providing examples like the following: ? Yard work, such as: ? Pushing a Conservation officer, nature ? Raking and bagging leaves ? Washing your car ? Pushing a stroller ? Shoveling snow ? Gardening ? Washing windows or floors  Patient will be able to explain general safety guidelines for exercising:   Before you start a new exercise program, talk with your health care provider.  Do not exercise so much that you hurt yourself, feel dizzy, or get very  short of breath.  Wear comfortable clothes and wear shoes with good support.  Drink plenty of water while you exercise to prevent dehydration or heat stroke.  Work out until your  breathing and your heartbeat get faster.       Personalized Health Maintenance & Screening Recommendations  Diabetic eye exam  Lung Cancer Screening Recommended: no (Low Dose CT Chest recommended if Age 60-80 years, 30 pack-year currently smoking OR have quit w/in past 15 years) Hepatitis C Screening recommended: no HIV Screening recommended: no  Advanced Directives: Written information was not prepared per patient's request.  Referrals & Orders No orders of the defined types were placed in this encounter.   Follow-up Plan . Follow-up with Sharion Balloon, FNP as planned . Schedule your yearly diabetic eye exam . Schedule COVID booster    I have personally reviewed and noted the following in the patient's chart:   . Medical and social history . Use of alcohol, tobacco or illicit drugs  . Current medications and supplements . Functional ability and status . Nutritional status . Physical activity . Advanced directives . List of other physicians . Hospitalizations, surgeries, and ER visits in previous 12 months . Vitals . Screenings to include cognitive, depression, and falls . Referrals and appointments  In addition, I have reviewed and discussed with Holly Price certain preventive protocols, quality metrics, and best practice recommendations. A written personalized care plan for preventive services as well as general preventive health recommendations is available and can be mailed to the patient at her request.      Lynnea Ferrier, LPN  61/10/[id number]

## 2020-05-04 NOTE — Patient Instructions (Signed)
[facility] Maintenance Summary and Written Plan of Care  [name] ,  Thank you for allowing me to perform your Medicare Annual Wellness Visit and for your ongoing commitment to your health.   Health Maintenance & Immunization History Health Maintenance  Topic Date Due  . OPHTHALMOLOGY EXAM  08/29/2016  . MAMMOGRAM  04/01/2021 (Originally 05/28/2013)  . HEMOGLOBIN A1C  09/29/2020  . FOOT EXAM  04/01/2021  . TETANUS/TDAP  04/29/2022  . COLONOSCOPY  10/13/2029  . INFLUENZA VACCINE  Completed  . DEXA SCAN  Completed  . COVID-19 Vaccine  Completed  . Hepatitis C Screening  Completed  . PNA vac Low Risk Adult  Completed   Immunization History  Administered Date(s) Administered  . Fluad Quad(high Dose 65+) 04/01/2020  . Influenza, High Dose Seasonal PF 06/16/2018  . Influenza,inj,Quad PF,6+ Mos 02/25/2013, 02/24/2014, 04/15/2015  . Moderna SARS-COVID-2 Vaccination 07/22/2019, 08/19/2019  . Pneumococcal Conjugate-13 02/15/2014  . Pneumococcal Polysaccharide-23 04/29/2012, 06/16/2018  . Tdap 04/29/2012  . Zoster 11/02/2013    These are the patient goals that we discussed: Goals Addressed            This Visit's Progress   . AWV       05/04/2020 AWV Goal: Diabetes Management  . Patient will maintain an A1C level below 8.0 . Patient will not develop any diabetic foot complications . Patient will not experience any hypoglycemic episodes over the next 3 months . Patient will notify our office of any CBG readings outside of the provider recommended range by calling [phone] . Patient will adhere to provider recommendations for diabetes management  Patient Self Management Activities . take all medications as prescribed and report any negative side effects . monitor and record blood sugar readings as directed . adhere to a low carbohydrate diet that incorporates lean proteins, vegetables, whole grains, low glycemic fruits . check feet daily  noting any sores, cracks, injuries, or callous formations . see PCP or podiatrist if she notices any changes in her legs, feet, or toenails . Patient will visit PCP and have an A1C level checked every 3 to 6 months as directed  . have a yearly eye exam to monitor for vascular changes associated with diabetes and will request that the report be sent to her pcp.  . consult with her PCP regarding any changes in her health or new or worsening symptoms   05/04/2020 AWV Goal: Exercise for General Health   Patient will verbalize understanding of the benefits of increased physical activity:  Exercising regularly is important. It will improve your overall fitness, flexibility, and endurance.  Regular exercise also will improve your overall health. It can help you control your weight, reduce stress, and improve your bone density.  Over the next year, patient will increase physical activity as tolerated with a goal of at least 150 minutes of moderate physical activity per week.   You can tell that you are exercising at a moderate intensity if your heart starts beating faster and you start breathing faster but can still hold a conversation.  Moderate-intensity exercise ideas include:  Walking 1 mile (1.6 km) in about 15 minutes  Biking  Hiking  Golfing  Dancing  Water aerobics  Patient will verbalize understanding of everyday activities that increase physical activity by providing examples like the following: ? Yard work, such as: ? Pushing a Conservation officer, nature ? Raking and bagging leaves ? Washing your car ? Pushing a stroller ? Shoveling snow ? Gardening ?  Washing windows or floors  Patient will be able to explain general safety guidelines for exercising:   Before you start a new exercise program, talk with your health care provider.  Do not exercise so much that you hurt yourself, feel dizzy, or get very short of breath.  Wear comfortable clothes and wear shoes with good  support.  Drink plenty of water while you exercise to prevent dehydration or heat stroke.  Work out until your breathing and your heartbeat get faster.         This is a list of Health Maintenance Items that are overdue or due now: Health Maintenance Due  Topic Date Due  . OPHTHALMOLOGY EXAM  08/29/2016     Orders/Referrals Placed Today: No orders of the defined types were placed in this encounter.  (Contact our referral department at [phone] if you have not spoken with someone about your referral appointment within the next 5 days)    Follow-up Plan . Follow-up with Sharion Balloon, FNP as planned . Schedule your yearly diabetic eye exam . Schedule COVID booster

## 2020-09-29 DIAGNOSIS — M79674 Pain in right toe(s): Secondary | ICD-10-CM | POA: Diagnosis not present

## 2020-09-29 DIAGNOSIS — L03031 Cellulitis of right toe: Secondary | ICD-10-CM | POA: Diagnosis not present

## 2020-10-13 DIAGNOSIS — L03031 Cellulitis of right toe: Secondary | ICD-10-CM | POA: Diagnosis not present

## 2020-10-25 ENCOUNTER — Telehealth: Payer: Self-pay | Admitting: Pharmacist

## 2020-10-25 NOTE — Telephone Encounter (Signed)
Health dept called and said they couldn't help patient get medicine any longer  Can you have her schedule with me and bring her financial information?  30 min appt    I can also see her on wednesdays under CCM appt --[facility] Pharm[facility]  Visit type in person

## 2020-10-25 NOTE — Telephone Encounter (Signed)
Appt set up patient aware

## 2020-11-08 ENCOUNTER — Ambulatory Visit (INDEPENDENT_AMBULATORY_CARE_PROVIDER_SITE_OTHER): Payer: Medicare Other | Admitting: Pharmacist

## 2020-11-08 ENCOUNTER — Other Ambulatory Visit: Payer: Self-pay

## 2020-11-08 ENCOUNTER — Telehealth: Payer: Self-pay | Admitting: Pharmacist

## 2020-11-08 DIAGNOSIS — E119 Type 2 diabetes mellitus without complications: Secondary | ICD-10-CM

## 2020-11-08 NOTE — Progress Notes (Signed)
    11/08/2020 Name: Holly Price MRN: [id number] DOB: Oct 09, 1950   S:  89 yoF Presents for diabetes evaluation, education, and management Patient was referred and last seen by Primary Care Provider on 03/2020.  Insurance coverage/medication affordability: BCBS medicare  Patient reports adherence with medications. Current diabetes medications include: victoza Current hypertension medications include: atenolol, lisinopril Goal 130/80 Current hyperlipidemia medications include: livalo G72 ICD for statin intolerance   Patient denies hypoglycemic events.   Patient reported dietary habits: Eats 3 meals/day Discussed meal planning options and Plate method for healthy eating Avoid sugary drinks and desserts Incorporate balanced protein, non starchy veggies, 1 serving of carbohydrate with each meal Increase water intake Increase physical activity as able  Patient-reported exercise habits: walking   Patient denies nocturia (nighttime urination).  Patient denies neuropathy (nerve pain).  Patient reports visual changes.  Patient reports self foot exams.    O:  Lab Results  Component Value Date   HGBA1C 6.3 04/01/2020   Lipid Panel     Component Value Date/Time   CHOL 166 04/01/2020 1000   CHOL 203 (H) 11/14/2012 0932   TRIG 122 04/01/2020 1000   TRIG 143 11/14/2012 0932   HDL 59 04/01/2020 1000   HDL 42 11/14/2012 0932   CHOLHDL 2.8 04/01/2020 1000   CHOLHDL 3.0 06/16/2018 1254   VLDL 36 06/16/2018 1254   LDLCALC 85 04/01/2020 1000   LDLCALC 132 (H) 11/14/2012 0932     Home fasting blood sugars: n/a  2 hour post-meal/random blood sugars: <180.    Clinical Atherosclerotic Cardiovascular Disease (ASCVD): No   The 10-year ASCVD risk score Mikey Bussing DC Jr., et al., 2013) is: 18.2%   Values used to calculate the score:     Age: 71 years     Sex: Female     Is Non-Hispanic African American: No     Diabetic: Yes     Tobacco smoker: No     Systolic Blood  Pressure: 140 mmHg     Is BP treated: Yes     HDL Cholesterol: 59 mg/dL     Total Cholesterol: 166 mg/dL    A/P:  Diabetes T2DM currently controlled. Patient is adherent with medication. [facility] Department is no longer servicing patient due to not being in the medicare coverage gap.  She is here today for patient assistance  Reviewed glucometer with patient She checks in the everning only--BG is <180 post prandial We will continue current Victoza prescription  PATIENT ASSISTANCE APPLICATION SUBMITTED FOR [facility] [place] PATIENT ASSISTANCE PROGRAM FOR 2022 MEDICATION: RYBELSUS 7MG  MEDICATION WILL SHIP TO PCP EVERY 4 MONTHS; WE MUST SUBMIT FOR REFILLS EVERY 4 MONTHS Denies personal and family history of Medullary thyroid cancer (MTC)  -Extensively discussed pathophysiology of diabetes, recommended lifestyle interventions, dietary effects on blood sugar control  -Counseled on s/sx of and management of hypoglycemia  -Next A1C anticipated patient to schedule appt for fall    Written patient instructions provided.  Total time in face to face counseling 25 minutes.    Regina Eck, PharmD, BCPS Clinical Pharmacist, [place]  [facility] Phone [phone]

## 2020-11-08 NOTE — Telephone Encounter (Signed)
Patient requesting refill on tramadol; uses PRN Please advise

## 2020-11-08 NOTE — Telephone Encounter (Signed)
NTBS Patient aware and will call back to schedule if needed.

## 2020-12-01 ENCOUNTER — Encounter: Payer: Self-pay | Admitting: Family

## 2020-12-01 ENCOUNTER — Other Ambulatory Visit: Payer: Self-pay

## 2020-12-01 ENCOUNTER — Ambulatory Visit (INDEPENDENT_AMBULATORY_CARE_PROVIDER_SITE_OTHER): Payer: Medicare Other | Admitting: Family

## 2020-12-01 VITALS — BP 172/74 | HR 67 | Temp 98.3°F | Ht 63.0 in | Wt 181.4 lb

## 2020-12-01 DIAGNOSIS — E1169 Type 2 diabetes mellitus with other specified complication: Secondary | ICD-10-CM

## 2020-12-01 DIAGNOSIS — E785 Hyperlipidemia, unspecified: Secondary | ICD-10-CM

## 2020-12-01 DIAGNOSIS — E559 Vitamin D deficiency, unspecified: Secondary | ICD-10-CM

## 2020-12-01 DIAGNOSIS — E0800 Diabetes mellitus due to underlying condition with hyperosmolarity without nonketotic hyperglycemic-hyperosmolar coma (NKHHC): Secondary | ICD-10-CM

## 2020-12-01 DIAGNOSIS — Z23 Encounter for immunization: Secondary | ICD-10-CM

## 2020-12-01 DIAGNOSIS — M5412 Radiculopathy, cervical region: Secondary | ICD-10-CM | POA: Diagnosis not present

## 2020-12-01 DIAGNOSIS — I152 Hypertension secondary to endocrine disorders: Secondary | ICD-10-CM | POA: Diagnosis not present

## 2020-12-01 DIAGNOSIS — M549 Dorsalgia, unspecified: Secondary | ICD-10-CM

## 2020-12-01 DIAGNOSIS — E1159 Type 2 diabetes mellitus with other circulatory complications: Secondary | ICD-10-CM

## 2020-12-01 DIAGNOSIS — M961 Postlaminectomy syndrome, not elsewhere classified: Secondary | ICD-10-CM

## 2020-12-01 DIAGNOSIS — M542 Cervicalgia: Secondary | ICD-10-CM

## 2020-12-01 LAB — BAYER DCA HB A1C WAIVED: HB A1C (BAYER DCA - WAIVED): 6.5 % (ref <7.0)

## 2020-12-01 MED ORDER — TRAMADOL HCL 50 MG PO TABS: 50.0000 mg | ORAL_TABLET | Freq: Three times a day (TID) | ORAL | 2 refills | Status: AC | PRN

## 2020-12-01 MED ORDER — BACLOFEN 10 MG PO TABS: 5.0000 mg | ORAL_TABLET | Freq: Three times a day (TID) | ORAL | 2 refills | Status: DC

## 2020-12-01 NOTE — Addendum Note (Signed)
Addended by: Brynda Peon F on: 12/01/2020 10:16 AM   Modules accepted: Orders

## 2020-12-01 NOTE — Progress Notes (Signed)
Subjective:    Patient ID: Holly Price, female    DOB: [birth year], 70 y.o.   MRN: [id number]  Chief Complaint  Patient presents with   Medical Management of Chronic Issues    PATIENT HAS CHRONIC NECK PAIN FROM [facility] PT ASKING FOR ULTRAM AS NEEDED. 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 She has a hx of DVT and was taken off her xarelto in February 2020.    She states her memory is stable at this time.  Hypertension This is a chronic problem. The current episode started more than 1 year ago. The problem has been waxing and waning since onset. The problem is uncontrolled. Associated symptoms include malaise/fatigue. Pertinent negatives include no blurred vision, peripheral edema or shortness of breath. Risk factors for coronary artery disease include dyslipidemia, obesity and sedentary lifestyle. The current treatment provides moderate improvement.  Hyperlipidemia This is a chronic problem. The current episode started more than 1 year ago. Exacerbating diseases include obesity. Pertinent negatives include no shortness of breath. Current antihyperlipidemic treatment includes diet change and statins. The current treatment provides mild improvement of lipids. Risk factors for coronary artery disease include dyslipidemia, hypertension, a sedentary lifestyle and post-menopausal.  Diabetes She presents for her follow-up diabetic visit. She has type 2 diabetes mellitus. Pertinent negatives for diabetes include no blurred vision and no foot paresthesias. Symptoms are stable. Risk factors for coronary artery disease include dyslipidemia, diabetes mellitus, hypertension, sedentary lifestyle and post-menopausal. She is following a generally healthy diet. Her overall blood glucose range is 130-140 mg/dl. An ACE inhibitor/angiotensin II receptor blocker is being taken. Eye exam is not current.  Arthritis Presents for follow-up visit. She complains  of pain and stiffness. Affected locations include the left MCP, right MCP, neck, right ankle and left ankle. Her pain is at a severity of 7/10.  Back Pain This is a chronic problem. The current episode started more than 1 year ago. The quality of the pain is described as cramping, burning and aching. The pain is at a severity of 7/10.     Review of Systems  Constitutional:  Positive for malaise/fatigue.  Eyes:  Negative for blurred vision.  Respiratory:  Negative for shortness of breath.   Musculoskeletal:  Positive for arthritis, back pain and stiffness.  All other systems reviewed and are negative.     Objective:   Physical Exam Vitals reviewed.  Constitutional:      General: She is not in acute distress.    Appearance: She is well-developed. She is obese.  HENT:     Head: Normocephalic and atraumatic.     Right Ear: Tympanic membrane normal.     Left Ear: Tympanic membrane normal.  Eyes:     Pupils: Pupils are equal, round, and reactive to light.  Neck:     Thyroid: No thyromegaly.  Cardiovascular:     Rate and Rhythm: Normal rate and regular rhythm.     Heart sounds: Normal heart sounds. No murmur heard. Pulmonary:     Effort: Pulmonary effort is normal. No respiratory distress.     Breath sounds: Normal breath sounds. No wheezing.  Abdominal:     General: Bowel sounds are normal. There is no distension.     Palpations: Abdomen is soft.     Tenderness: There is no abdominal tenderness.  Musculoskeletal:        General: No tenderness. Normal range of motion.  Cervical back: Normal range of motion and neck supple.  Skin:    General: Skin is warm and dry.  Neurological:     Mental Status: She is alert and oriented to person, place, and time.     Cranial Nerves: No cranial nerve deficit.     Deep Tendon Reflexes: Reflexes are normal and symmetric.  Psychiatric:        Behavior: Behavior normal.        Thought Content: Thought content normal.        Judgment:  Judgment normal.      BP (!) 172/74   Pulse 67   Temp 98.3 F (36.8 C) (Temporal)   Ht 5' 3"  (1.6 m)   Wt 181 lb 6.4 oz (82.3 kg)   SpO2 97%   BMI 32.13 kg/m      Assessment & Plan:  KLA BILY comes in today with chief complaint of Medical Management of Chronic Issues (PATIENT HAS CHRONIC NECK PAIN FROM [facility] PT ASKING FOR ULTRAM AS NEEDED. 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 Hypertension associated with diabetes ([place]) - CMP14+EGFR - CBC with Differential/Platelet  2. Diabetes mellitus due to underlying condition with hyperosmolarity without coma, without long-term current use of insulin (HCC) - Bayer DCA Hb A1c Waived - CMP14+EGFR - CBC with Differential/Platelet  3. Hyperlipidemia associated with type 2 diabetes mellitus (HCC) - CMP14+EGFR - CBC with Differential/Platelet  4. Cervical radiculitis - CMP14+EGFR - CBC with Differential/Platelet  5. Mid back pain - CMP14+EGFR - CBC with Differential/Platelet - traMADol (ULTRAM) 50 MG tablet; Take 1 tablet (50 mg total) by mouth every 8 (eight) hours as needed for up to 5 days.  Dispense: 20 tablet; Refill: 2 - baclofen (LIORESAL) 10 MG tablet; Take 0.5 tablets (5 mg total) by mouth 3 (three) times daily.  Dispense: 90 each; Refill: 2  6. Cervical post-laminectomy syndrome  - CMP14+EGFR - CBC with Differential/Platelet - traMADol (ULTRAM) 50 MG tablet; Take 1 tablet (50 mg total) by mouth every 8 (eight) hours as needed for up to 5 days.  Dispense: 20 tablet; Refill: 2 - baclofen (LIORESAL) 10 MG tablet; Take 0.5 tablets (5 mg total) by mouth 3 (three) times daily.  Dispense: 90 each; Refill: 2  7. Vitamin D deficiency - CMP14+EGFR - CBC with Differential/Platelet  8. Neck pain - CMP14+EGFR - CBC with Differential/Platelet - traMADol (ULTRAM) 50 MG tablet; Take 1 tablet (50 mg total) by mouth every 8 (eight) hours as needed for up to 5 days.  Dispense: 20 tablet;  Refill: 2 - baclofen (LIORESAL) 10 MG tablet; Take 0.5 tablets (5 mg total) by mouth 3 (three) times daily.  Dispense: 90 each; Refill: 2   Pt reviewed in [employer] controlled database with no red flags noticed.  Labs pending Health Maintenance reviewed Diet and exercise encouraged  Follow up plan: 3 months    Evelina Dun, FNP

## 2020-12-01 NOTE — Patient Instructions (Signed)
Osteoarthritis Osteoarthritis is a type of arthritis. It refers to joint pain or joint disease. Osteoarthritis affects tissue that covers the ends of bones in joints (cartilage). Cartilage acts as a cushion between the bones and helps them move smoothly. Osteoarthritis occurs when cartilage in the joints gets worn down. Osteoarthritis is sometimes called "wear and tear" arthritis. Osteoarthritis is the most common form of arthritis. It often occurs in older people. It is a condition that gets worse over time. The joints most often affected by this condition are in the fingers, toes, hips, knees, and spine, including the neck and lower back. What are the causes? This condition is caused by the wearing down of cartilage that covers the ends of bones. What increases the risk? The following factors may make you more likely to develop this condition: Being age 50 or older. Obesity. Overuse of joints. Past injury of a joint. Past surgery on a joint. Family history of osteoarthritis. What are the signs or symptoms? The main symptoms of this condition are pain, swelling, and stiffness in the joint. Other symptoms may include: An enlarged joint. More pain and further damage caused by small pieces of bone or cartilage that break off and float inside of the joint. Small deposits of bone (osteophytes) that grow on the edges of the joint. A grating or scraping feeling inside the joint when you move it. Popping or creaking sounds when you move. Difficulty walking or exercising. An inability to grip items, twist your hand(s), or control the movements of your hands and fingers. How is this diagnosed? This condition may be diagnosed based on: Your medical history. A physical exam. Your symptoms. X-rays of the affected joint(s). Blood tests to rule out other types of arthritis. How is this treated? There is no cure for this condition, but treatment can help control pain and improve joint function.  Treatment may include a combination of therapies, such as: Pain relief techniques, such as: Applying heat and cold to the joint. Massage. A form of talk therapy called cognitive behavioral therapy (CBT). This therapy helps you set goals and follow up on the changes that you make. Medicines for pain and inflammation. The medicines can be taken by mouth or applied to the skin. They include: NSAIDs, such as ibuprofen. Prescription medicines. Strong anti-inflammatory medicines (corticosteroids). Certain nutritional supplements. A prescribed exercise program. You may work with a physical therapist. Assistive devices, such as a brace, wrap, splint, specialized glove, or cane. A weight control plan. Surgery, such as: An osteotomy. This is done to reposition the bones and relieve pain or to remove loose pieces of bone and cartilage. Joint replacement surgery. You may need this surgery if you have advanced osteoarthritis. Follow these instructions at home: Activity Rest your affected joints as told by your health care provider. Exercise as told by your health care provider. He or she may recommend specific types of exercise, such as: Strengthening exercises. These are done to strengthen the muscles that support joints affected by arthritis. Aerobic activities. These are exercises, such as brisk walking or water aerobics, that increase your heart rate. Range-of-motion activities. These help your joints move more easily. Balance and agility exercises. Managing pain, stiffness, and swelling   If directed, apply heat to the affected area as often as told by your health care provider. Use the heat source that your health care provider recommends, such as a moist heat pack or a heating pad. If you have a removable assistive device, remove it as told by   your health care provider. Place a towel between your skin and the heat source. If your health care provider tells you to keep the assistive device on  while you apply heat, place a towel between the assistive device and the heat source. Leave the heat on for 20-30 minutes. Remove the heat if your skin turns bright red. This is especially important if you are unable to feel pain, heat, or cold. You may have a greater risk of getting burned. If directed, put ice on the affected area. To do this: If you have a removable assistive device, remove it as told by your health care provider. Put ice in a plastic bag. Place a towel between your skin and the bag. If your health care provider tells you to keep the assistive device on during icing, place a towel between the assistive device and the bag. Leave the ice on for 20 minutes, 2-3 times a day. Move your fingers or toes often to reduce stiffness and swelling. Raise (elevate) the injured area above the level of your heart while you are sitting or lying down. General instructions Take over-the-counter and prescription medicines only as told by your health care provider. Maintain a healthy weight. Follow instructions from your health care provider for weight control. Do not use any products that contain nicotine or tobacco, such as cigarettes, e-cigarettes, and chewing tobacco. If you need help quitting, ask your health care provider. Use assistive devices as told by your health care provider. Keep all follow-up visits as told by your health care provider. This is important. Where to find more information National Institute of Arthritis and Musculoskeletal and Skin Diseases: www.niams.nih.gov National Institute on Aging: www.nia.nih.gov American College of Rheumatology: www.rheumatology.org Contact a health care provider if: You have redness, swelling, or a feeling of warmth in a joint that gets worse. You have a fever along with joint or muscle aches. You develop a rash. You have trouble doing your normal activities. Get help right away if: You have pain that gets worse and is not relieved by  pain medicine. Summary Osteoarthritis is a type of arthritis that affects tissue covering the ends of bones in joints (cartilage). This condition is caused by the wearing down of cartilage that covers the ends of bones. The main symptom of this condition is pain, swelling, and stiffness in the joint. There is no cure for this condition, but treatment can help control pain and improve joint function. This information is not intended to replace advice given to you by your health care provider. Make sure you discuss any questions you have with your health care provider. Document Revised: 05/11/2019 Document Reviewed: 05/11/2019 Elsevier Patient Education  2022 Elsevier Inc.  

## 2020-12-02 LAB — CMP14+EGFR
ALT: 15 IU/L (ref 0–32)
AST: 19 IU/L (ref 0–40)
Albumin/Globulin Ratio: 2.1 (ref 1.2–2.2)
Albumin: 4.4 g/dL (ref 3.8–4.8)
Alkaline Phosphatase: 39 IU/L — ABNORMAL LOW (ref 44–121)
BUN/Creatinine Ratio: 18 (ref 12–28)
BUN: 13 mg/dL (ref 8–27)
Bilirubin Total: 0.4 mg/dL (ref 0.0–1.2)
CO2: 24 mmol/L (ref 20–29)
Calcium: 10 mg/dL (ref 8.7–10.3)
Chloride: 100 mmol/L (ref 96–106)
Creatinine, Ser: 0.72 mg/dL (ref 0.57–1.00)
Globulin, Total: 2.1 g/dL (ref 1.5–4.5)
Glucose: 115 mg/dL — ABNORMAL HIGH (ref 65–99)
Potassium: 4.7 mmol/L (ref 3.5–5.2)
Sodium: 141 mmol/L (ref 134–144)
Total Protein: 6.5 g/dL (ref 6.0–8.5)
eGFR: 92 mL/min/{1.73_m2} (ref >59)

## 2020-12-02 LAB — CBC WITH DIFFERENTIAL/PLATELET
Basophils Absolute: 0.1 10*3/uL (ref 0.0–0.2)
Basos: 1 %
EOS (ABSOLUTE): 0.2 10*3/uL (ref 0.0–0.4)
Eos: 3 %
Hematocrit: 48.3 % — ABNORMAL HIGH (ref 34.0–46.6)
Hemoglobin: 16.2 g/dL — ABNORMAL HIGH (ref 11.1–15.9)
Immature Grans (Abs): 0 10*3/uL (ref 0.0–0.1)
Immature Granulocytes: 0 %
Lymphocytes Absolute: 2.7 10*3/uL (ref 0.7–3.1)
Lymphs: 32 %
MCH: 32.7 pg (ref 26.6–33.0)
MCHC: 33.5 g/dL (ref 31.5–35.7)
MCV: 97 fL (ref 79–97)
Monocytes Absolute: 0.8 10*3/uL (ref 0.1–0.9)
Monocytes: 10 %
Neutrophils Absolute: 4.6 10*3/uL (ref 1.4–7.0)
Neutrophils: 54 %
Platelets: 256 10*3/uL (ref 150–450)
RBC: 4.96 x10E6/uL (ref 3.77–5.28)
RDW: 12.4 % (ref 11.7–15.4)
WBC: 8.4 10*3/uL (ref 3.4–10.8)

## 2021-02-01 NOTE — Progress Notes (Signed)
Received notification from [place] regarding approval for [place]. Patient assistance approved from 02/01/21 to 05/27/21.  4 BOXES OF MEDICATION ARE PENDING SHIPMENT TO OFFICE   Phone: [phone]

## 2021-02-17 ENCOUNTER — Telehealth: Payer: Self-pay | Admitting: Pharmacist

## 2021-02-17 NOTE — Telephone Encounter (Signed)
4 BOXES OF VICTOZA ARRIVED FOR PATIENT VIA NOVO [place] PATIENT ASSISTANCE PROGRAM PLACED IN [place]

## 2021-03-14 ENCOUNTER — Ambulatory Visit (INDEPENDENT_AMBULATORY_CARE_PROVIDER_SITE_OTHER): Payer: Medicare Other | Admitting: Family

## 2021-03-14 ENCOUNTER — Encounter: Payer: Self-pay | Admitting: Family

## 2021-03-14 ENCOUNTER — Other Ambulatory Visit: Payer: Self-pay

## 2021-03-14 VITALS — BP 146/62 | HR 97 | Temp 97.8°F | Ht 63.0 in | Wt 181.8 lb

## 2021-03-14 DIAGNOSIS — E1159 Type 2 diabetes mellitus with other circulatory complications: Secondary | ICD-10-CM

## 2021-03-14 DIAGNOSIS — F0394 Unspecified dementia, unspecified severity, with anxiety: Secondary | ICD-10-CM | POA: Diagnosis not present

## 2021-03-14 DIAGNOSIS — E1169 Type 2 diabetes mellitus with other specified complication: Secondary | ICD-10-CM

## 2021-03-14 DIAGNOSIS — Z23 Encounter for immunization: Secondary | ICD-10-CM

## 2021-03-14 DIAGNOSIS — E559 Vitamin D deficiency, unspecified: Secondary | ICD-10-CM

## 2021-03-14 DIAGNOSIS — E0800 Diabetes mellitus due to underlying condition with hyperosmolarity without nonketotic hyperglycemic-hyperosmolar coma (NKHHC): Secondary | ICD-10-CM | POA: Diagnosis not present

## 2021-03-14 DIAGNOSIS — Z86718 Personal history of other venous thrombosis and embolism: Secondary | ICD-10-CM

## 2021-03-14 DIAGNOSIS — I152 Hypertension secondary to endocrine disorders: Secondary | ICD-10-CM

## 2021-03-14 DIAGNOSIS — E669 Obesity, unspecified: Secondary | ICD-10-CM

## 2021-03-14 DIAGNOSIS — E1142 Type 2 diabetes mellitus with diabetic polyneuropathy: Secondary | ICD-10-CM | POA: Diagnosis not present

## 2021-03-14 DIAGNOSIS — E785 Hyperlipidemia, unspecified: Secondary | ICD-10-CM

## 2021-03-14 DIAGNOSIS — M549 Dorsalgia, unspecified: Secondary | ICD-10-CM

## 2021-03-14 LAB — BAYER DCA HB A1C WAIVED: HB A1C (BAYER DCA - WAIVED): 6.9 % — ABNORMAL HIGH (ref 4.8–5.6)

## 2021-03-14 MED ORDER — LIVALO 2 MG PO TABS
2.0000 mg | ORAL_TABLET | Freq: Every day | ORAL | 1 refills | Status: DC
Start: 2021-03-14 — End: 2021-11-29

## 2021-03-14 MED ORDER — VICTOZA 18 MG/3ML ~~LOC~~ SOPN: 1.8000 mg | PEN_INJECTOR | Freq: Every evening | SUBCUTANEOUS | 2 refills | Status: DC

## 2021-03-14 MED ORDER — AMLODIPINE BESYLATE 5 MG PO TABS: 5.0000 mg | ORAL_TABLET | Freq: Every day | ORAL | 1 refills | Status: DC

## 2021-03-14 MED ORDER — LISINOPRIL-HYDROCHLOROTHIAZIDE 20-12.5 MG PO TABS: 1.0000 | ORAL_TABLET | Freq: Two times a day (BID) | ORAL | 1 refills | Status: DC

## 2021-03-14 MED ORDER — ATENOLOL 50 MG PO TABS: 50.0000 mg | ORAL_TABLET | Freq: Every day | ORAL | 1 refills | Status: DC

## 2021-03-14 MED ORDER — MELOXICAM 15 MG PO TABS
15.0000 mg | ORAL_TABLET | Freq: Every day | ORAL | 2 refills | Status: DC
Start: 2021-03-14 — End: 2021-08-23

## 2021-03-14 MED ORDER — DICLOFENAC SODIUM 1 % EX GEL: 2.0000 g | Freq: Four times a day (QID) | CUTANEOUS | 1 refills | Status: DC

## 2021-03-14 MED ORDER — TRAMADOL HCL 50 MG PO TABS
50.0000 mg | ORAL_TABLET | Freq: Three times a day (TID) | ORAL | 0 refills | Status: DC | PRN
Start: 2021-03-14 — End: 2021-06-16

## 2021-03-14 NOTE — Progress Notes (Signed)
Subjective:    Patient ID: Holly Price, female    DOB: [birth year], 71 y.o.   MRN: [id number]  Chief Complaint  Patient presents with   Medical Management of Chronic Issues    WANTS SOME TRAMADOL FOR BACK PAIN   PT presents to the office today for chronic follow up. She has a hx of DVT and was taken off her xarelto in February 2020.    She states her memory is stable at this time.  Hypertension This is a chronic problem. The current episode started more than 1 year ago. The problem has been resolved since onset. The problem is controlled. Pertinent negatives include no blurred vision, malaise/fatigue, peripheral edema or shortness of breath. Risk factors for coronary artery disease include dyslipidemia and diabetes mellitus. The current treatment provides moderate improvement.  Hyperlipidemia This is a chronic problem. The current episode started more than 1 year ago. Exacerbating diseases include obesity. Pertinent negatives include no shortness of breath. Current antihyperlipidemic treatment includes statins. The current treatment provides moderate improvement of lipids. Risk factors for coronary artery disease include dyslipidemia, diabetes mellitus, hypertension, a sedentary lifestyle and post-menopausal.  Diabetes She presents for her follow-up diabetic visit. She has type 2 diabetes mellitus. Pertinent negatives for diabetes include no blurred vision and no foot paresthesias. Symptoms are stable. Diabetic complications include heart disease and nephropathy. Risk factors for coronary artery disease include diabetes mellitus, dyslipidemia, hypertension, sedentary lifestyle and post-menopausal. She is following a generally unhealthy diet. Her overall blood glucose range is 110-130 mg/dl. An ACE inhibitor/angiotensin II receptor blocker is being taken. Eye exam is not current.  Arthritis Presents for follow-up visit. She complains of pain and stiffness. The symptoms have been stable.  Affected locations include the left knee, right knee, left shoulder and right shoulder. Her pain is at a severity of 0/10.     Review of Systems  Constitutional:  Negative for malaise/fatigue.  Eyes:  Negative for blurred vision.  Respiratory:  Negative for shortness of breath.   Musculoskeletal:  Positive for arthritis and stiffness.  All other systems reviewed and are negative.     Objective:   Physical Exam Vitals reviewed.  Constitutional:      General: She is not in acute distress.    Appearance: She is well-developed. She is obese.  HENT:     Head: Normocephalic and atraumatic.     Right Ear: Tympanic membrane normal.     Left Ear: Tympanic membrane normal.  Eyes:     Pupils: Pupils are equal, round, and reactive to light.  Neck:     Thyroid: No thyromegaly.  Cardiovascular:     Rate and Rhythm: Normal rate and regular rhythm.     Heart sounds: Normal heart sounds. No murmur heard. Pulmonary:     Effort: Pulmonary effort is normal. No respiratory distress.     Breath sounds: Normal breath sounds. No wheezing.  Abdominal:     General: Bowel sounds are normal. There is no distension.     Palpations: Abdomen is soft.     Tenderness: There is no abdominal tenderness.  Musculoskeletal:        General: No tenderness. Normal range of motion.     Cervical back: Normal range of motion and neck supple.  Skin:    General: Skin is warm and dry.  Neurological:     Mental Status: She is alert and oriented to person, place, and time.     Cranial Nerves: No cranial nerve deficit.  Deep Tendon Reflexes: Reflexes are normal and symmetric.  Psychiatric:        Behavior: Behavior normal.        Thought Content: Thought content normal.        Judgment: Judgment normal.      BP (!) 146/62   Pulse 97   Temp 97.8 F (36.6 C) (Temporal)   Ht _0  (1.6 m)   Wt 181 lb 12.8 oz (82.5 kg)   BMI 32.20 kg/m      Assessment & Plan:  Holly Price comes in today with chief  complaint of Medical Management of Chronic Issues (WANTS SOME TRAMADOL FOR BACK PAIN)   Diagnosis and orders addressed:  1. Diabetes mellitus due to underlying condition with hyperosmolarity without coma, without long-term current use of insulin (HCC) - liraglutide (VICTOZA) 18 MG/3ML SOPN; Inject 1.8 mg into the skin every evening.  Dispense: 12 mL; Refill: 2 - Bayer DCA Hb A1c Waived - CMP14+EGFR  2. Diabetic polyneuropathy associated with type 2 diabetes mellitus (HCC) - liraglutide (VICTOZA) 18 MG/3ML SOPN; Inject 1.8 mg into the skin every evening.  Dispense: 12 mL; Refill: 2 - CMP14+EGFR  3. Hypertension associated with diabetes ([place]) - amLODipine (NORVASC) 5 MG tablet; Take 1 tablet (5 mg total) by mouth daily.  Dispense: 90 tablet; Refill: 1 - atenolol (TENORMIN) 50 MG tablet; Take 1 tablet (50 mg total) by mouth daily.  Dispense: 90 tablet; Refill: 1 - lisinopril-hydrochlorothiazide (ZESTORETIC) 20-12.5 MG tablet; Take 1 tablet by mouth 2 (two) times daily.  Dispense: 180 tablet; Refill: 1 - CMP14+EGFR  4. Hyperlipidemia associated with type 2 diabetes mellitus (HCC) - Pitavastatin Calcium (LIVALO) 2 MG TABS; Take 1 tablet (2 mg total) by mouth daily.  Dispense: 90 tablet; Refill: 1 - CMP14+EGFR  5. Dementia with anxiety, unspecified dementia severity, unspecified dementia type - CMP14+EGFR  6. Vitamin D deficiency - CMP14+EGFR  7. Obesity (BMI 30-39.9) - CMP14+EGFR  8. History of DVT (deep vein thrombosis)  - CMP14+EGFR  9. Mid back pain Will give #20 of Ultram to help with pain - meloxicam (MOBIC) 15 MG tablet; Take 1 tablet (15 mg total) by mouth daily.  Dispense: 90 tablet; Refill: 2 - diclofenac Sodium (VOLTAREN) 1 % GEL; Apply 2 g topically 4 (four) times daily.  Dispense: 350 g; Refill: 1 - CMP14+EGFR - traMADol (ULTRAM) 50 MG tablet; Take 1 tablet (50 mg total) by mouth every 8 (eight) hours as needed.  Dispense: 20 tablet; Refill: 0   Labs  pending Health Maintenance reviewed Diet and exercise encouraged  Follow up plan: 3 months    Evelina Dun, FNP

## 2021-03-14 NOTE — Patient Instructions (Signed)

## 2021-03-15 LAB — CMP14+EGFR
ALT: 12 IU/L (ref 0–32)
AST: 15 IU/L (ref 0–40)
Albumin/Globulin Ratio: 1.9 (ref 1.2–2.2)
Albumin: 4.5 g/dL (ref 3.8–4.8)
Alkaline Phosphatase: 44 IU/L (ref 44–121)
BUN/Creatinine Ratio: 17 (ref 12–28)
BUN: 13 mg/dL (ref 8–27)
Bilirubin Total: 0.4 mg/dL (ref 0.0–1.2)
CO2: 27 mmol/L (ref 20–29)
Calcium: 9.8 mg/dL (ref 8.7–10.3)
Chloride: 101 mmol/L (ref 96–106)
Creatinine, Ser: 0.76 mg/dL (ref 0.57–1.00)
Globulin, Total: 2.4 g/dL (ref 1.5–4.5)
Glucose: 163 mg/dL — ABNORMAL HIGH (ref 70–99)
Potassium: 4.4 mmol/L (ref 3.5–5.2)
Sodium: 144 mmol/L (ref 134–144)
Total Protein: 6.9 g/dL (ref 6.0–8.5)
eGFR: 86 mL/min/{1.73_m2} (ref >59)

## 2021-03-17 ENCOUNTER — Telehealth: Payer: Self-pay | Admitting: Family

## 2021-03-17 NOTE — Telephone Encounter (Signed)
Left message for [employer] to return call

## 2021-03-17 NOTE — Telephone Encounter (Signed)
Key: [place]  Your information has been submitted to [place]. [employer] [place] will review the request and notify you of the determination decision directly, typically within 3 business days of your submission and once all necessary information is received.  You will also receive your request decision electronically. To check for an update later, open the request again from your dashboard.  If [employer] [place] has not responded within the specified timeframe or if you have any questions about your PA submission, contact [place] directly at [facility]) [phone] or ([place]) [phone].  Key: [id number]) Diclofenac  Your information has been submitted to [place]. [employer] [place] will review the request and notify you of the determination decision directly, typically within 3 business days of your submission and once all necessary information is received.  You will also receive your request decision electronically. To check for an update later, open the request again from your dashboard.  If [employer] [place] has not responded within the specified timeframe or if you have any questions about your PA submission, contact [place] directly at [facility]) [phone] or ([place]) [phone].  Key: [id number]  V[place]oza Rx #: [id number] PA sent to plan

## 2021-03-20 NOTE — Telephone Encounter (Signed)
Denied on October 21 Diclofenac Sodium 1% gel Your request has been denied   Your request has been approved-[place] Effective from 03/17/2021 through 03/17/2022.

## 2021-05-05 ENCOUNTER — Ambulatory Visit: Payer: Medicare Other

## 2021-05-10 ENCOUNTER — Ambulatory Visit (INDEPENDENT_AMBULATORY_CARE_PROVIDER_SITE_OTHER): Payer: Medicare Other

## 2021-05-10 VITALS — Ht 63.0 in | Wt 180.0 lb

## 2021-05-10 DIAGNOSIS — Z0000 Encounter for general adult medical examination without abnormal findings: Secondary | ICD-10-CM | POA: Diagnosis not present

## 2021-05-10 NOTE — Patient Instructions (Signed)
Holly Price , Thank you for taking time to come for your Medicare Wellness Visit. I appreciate your ongoing commitment to your health goals. Please review the following plan we discussed and let me know if I can assist you in the future.   Screening recommendations/referrals: Colonoscopy: Done 10/14/2019 - Repeat in 10 years  Mammogram: Ordered - Appointment 06/26/20 at 9:50am - Repeat annually  Bone Density: Done 06/23/2018 -Repeat in 5 years  Recommended yearly ophthalmology/optometry visit for glaucoma screening and checkup Recommended yearly dental visit for hygiene and checkup  Vaccinations: Influenza vaccine: Done 03/14/2021 - Repeat annually  Pneumococcal vaccine: Done 02/15/2014 & 06/16/2018 Tdap vaccine: Done 04/29/2012 - Repeat in 10 years Shingles vaccine: Done 12/01/2020 - due for second dose   Covid-19: Declined  Advanced directives: Advance directive discussed with you today. Even though you declined this today, please call our office should you change your mind, and we can give you the proper paperwork for you to fill out.   Conditions/risks identified: Aim for 30 minutes of exercise or brisk walking each day, drink 6-8 glasses of water and eat lots of fruits and vegetables.   Next appointment: Follow up in one year for your annual wellness visit    Preventive Care 65 Years and Older, Female Preventive care refers to lifestyle choices and visits with your health care provider that can promote health and wellness. What does preventive care include? A yearly physical exam. This is also called an annual well check. Dental exams once or twice a year. Routine eye exams. Ask your health care provider how often you should have your eyes checked. Personal lifestyle choices, including: Daily care of your teeth and gums. Regular physical activity. Eating a healthy diet. Avoiding tobacco and drug use. Limiting alcohol use. Practicing safe sex. Taking low-dose aspirin every  day. Taking vitamin and mineral supplements as recommended by your health care provider. What happens during an annual well check? The services and screenings done by your health care provider during your annual well check will depend on your age, overall health, lifestyle risk factors, and family history of disease. Counseling  Your health care provider may ask you questions about your: Alcohol use. Tobacco use. Drug use. Emotional well-being. Home and relationship well-being. Sexual activity. Eating habits. History of falls. Memory and ability to understand (cognition). Work and work Statistician. Reproductive health. Screening  You may have the following tests or measurements: Height, weight, and BMI. Blood pressure. Lipid and cholesterol levels. These may be checked every 5 years, or more frequently if you are over 43 years old. Skin check. Lung cancer screening. You may have this screening every year starting at age 44 if you have a 30-pack-year history of smoking and currently smoke or have quit within the past 15 years. Fecal occult blood test (FOBT) of the stool. You may have this test every year starting at age 48. Flexible sigmoidoscopy or colonoscopy. You may have a sigmoidoscopy every 5 years or a colonoscopy every 10 years starting at age 51. Hepatitis C blood test. Hepatitis B blood test. Sexually transmitted disease (STD) testing. Diabetes screening. This is done by checking your blood sugar (glucose) after you have not eaten for a while (fasting). You may have this done every 1-3 years. Bone density scan. This is done to screen for osteoporosis. You may have this done starting at age 3. Mammogram. This may be done every 1-2 years. Talk to your health care provider about how often you should have regular mammograms.  Talk with your health care provider about your test results, treatment options, and if necessary, the need for more tests. Vaccines  Your health care  provider may recommend certain vaccines, such as: Influenza vaccine. This is recommended every year. Tetanus, diphtheria, and acellular pertussis (Tdap, Td) vaccine. You may need a Td booster every 10 years. Zoster vaccine. You may need this after age 62. Pneumococcal 13-valent conjugate (PCV13) vaccine. One dose is recommended after age 6. Pneumococcal polysaccharide (PPSV23) vaccine. One dose is recommended after age 52. Talk to your health care provider about which screenings and vaccines you need and how often you need them. This information is not intended to replace advice given to you by your health care provider. Make sure you discuss any questions you have with your health care provider. Document Released: 06/10/2015 Document Revised: 02/01/2016 Document Reviewed: 03/15/2015 Elsevier Interactive Patient Education  2017 [place] Prevention in the Home Falls can cause injuries. They can happen to people of all ages. There are many things you can do to make your home safe and to help prevent falls. What can I do on the outside of my home? Regularly fix the edges of walkways and driveways and fix any cracks. Remove anything that might make you trip as you walk through a door, such as a raised step or threshold. Trim any bushes or trees on the path to your home. Use bright outdoor lighting. Clear any walking paths of anything that might make someone trip, such as rocks or tools. Regularly check to see if handrails are loose or broken. Make sure that both sides of any steps have handrails. Any raised decks and porches should have guardrails on the edges. Have any leaves, snow, or ice cleared regularly. Use sand or salt on walking paths during winter. Clean up any spills in your garage right away. This includes oil or grease spills. What can I do in the bathroom? Use night lights. Install grab bars by the toilet and in the tub and shower. Do not use towel bars as grab  bars. Use non-skid mats or decals in the tub or shower. If you need to sit down in the shower, use a plastic, non-slip stool. Keep the floor dry. Clean up any water that spills on the floor as soon as it happens. Remove soap buildup in the tub or shower regularly. Attach bath mats securely with double-sided non-slip rug tape. Do not have throw rugs and other things on the floor that can make you trip. What can I do in the bedroom? Use night lights. Make sure that you have a light by your bed that is easy to reach. Do not use any sheets or blankets that are too big for your bed. They should not hang down onto the floor. Have a firm chair that has side arms. You can use this for support while you get dressed. Do not have throw rugs and other things on the floor that can make you trip. What can I do in the kitchen? Clean up any spills right away. Avoid walking on wet floors. Keep items that you use a lot in easy-to-reach places. If you need to reach something above you, use a strong step stool that has a grab bar. Keep electrical cords out of the way. Do not use floor polish or wax that makes floors slippery. If you must use wax, use non-skid floor wax. Do not have throw rugs and other things on the floor that can make  you trip. What can I do with my stairs? Do not leave any items on the stairs. Make sure that there are handrails on both sides of the stairs and use them. Fix handrails that are broken or loose. Make sure that handrails are as long as the stairways. Check any carpeting to make sure that it is firmly attached to the stairs. Fix any carpet that is loose or worn. Avoid having throw rugs at the top or bottom of the stairs. If you do have throw rugs, attach them to the floor with carpet tape. Make sure that you have a light switch at the top of the stairs and the bottom of the stairs. If you do not have them, ask someone to add them for you. What else can I do to help prevent  falls? Wear shoes that: Do not have high heels. Have rubber bottoms. Are comfortable and fit you well. Are closed at the toe. Do not wear sandals. If you use a stepladder: Make sure that it is fully opened. Do not climb a closed stepladder. Make sure that both sides of the stepladder are locked into place. Ask someone to hold it for you, if possible. Clearly mark and make sure that you can see: Any grab bars or handrails. First and last steps. Where the edge of each step is. Use tools that help you move around (mobility aids) if they are needed. These include: Canes. Walkers. Scooters. Crutches. Turn on the lights when you go into a dark area. Replace any light bulbs as soon as they burn out. Set up your furniture so you have a clear path. Avoid moving your furniture around. If any of your floors are uneven, fix them. If there are any pets around you, be aware of where they are. Review your medicines with your doctor. Some medicines can make you feel dizzy. This can increase your chance of falling. Ask your doctor what other things that you can do to help prevent falls. This information is not intended to replace advice given to you by your health care provider. Make sure you discuss any questions you have with your health care provider. Document Released: 03/10/2009 Document Revised: 10/20/2015 Document Reviewed: 06/18/2014 Elsevier Interactive Patient Education  2017 [employer].

## 2021-05-10 NOTE — Progress Notes (Signed)
Subjective:   Holly Price is a 70 y.o. female who presents for Medicare Annual (Subsequent) preventive examination.  Virtual Visit via Telephone Note  I connected with  Holly Price on 05/10/21 at  4:15 PM EST by telephone and verified that I am speaking with the correct person using two identifiers.  Location: Patient: Home Provider: [facility] Persons participating in the virtual visit: patient/Nurse Health Advisor   I discussed the limitations, risks, security and privacy concerns of performing an evaluation and management service by telephone and the availability of in person appointments. The patient expressed understanding and agreed to proceed.  Interactive audio and video telecommunications were attempted between this nurse and patient, however failed, due to patient having technical difficulties OR patient did not have access to video capability.  We continued and completed visit with audio only.  Some vital signs may be absent or patient reported.    E , LPN   Review of Systems     Cardiac Risk Factors include: advanced age (>62mn, >>65women);diabetes mellitus;obesity (BMI >30kg/m2);sedentary lifestyle;hypertension;dyslipidemia;Other (see comment), Risk factor comments: hx of DVT     Objective:    Today's Vitals   05/10/21 1539  Weight: 180 lb (81.6 kg)  Height: 5' 3" (1.6 m)   Body mass index is 31.89 kg/m.  Advanced Directives 05/10/2021 05/04/2020 10/14/2019 10/07/2018 07/14/2018 06/24/2018 06/24/2015  Does Patient Have a Medical Advance Directive? _0  No No  Type of Advance Directive - - - - - - -  Would patient like information on creating a medical advance directive? No - Patient declined No - Patient declined Yes (MAU/Ambulatory/Procedural Areas - Information given) No - Patient declined No - Patient declined - -    Current Medications (verified) Outpatient Encounter Medications as of 05/10/2021  Medication Sig   ACCU-CHEK AVIVA PLUS  test strip TWICE DAILY   acetaminophen (TYLENOL) 500 MG tablet Take 1,000 mg by mouth every 6 (six) hours as needed for moderate pain or headache.   amLODipine (NORVASC) 5 MG tablet Take 1 tablet (5 mg total) by mouth daily.   atenolol (TENORMIN) 50 MG tablet Take 1 tablet (50 mg total) by mouth daily.   Blood Glucose Monitoring Suppl (ACCU-CHEK AVIVA PLUS) W/DEVICE KIT 1 Device by Does not apply route 2 (two) times daily. Test blood sugar twice daily.dx.250.0   Calcium Citrate-Vitamin D (CALCIUM + D PO) Take 1 tablet by mouth daily.   diclofenac Sodium (VOLTAREN) 1 % GEL Apply 2 g topically 4 (four) times daily.   EPINEPHrine 0.3 mg/0.3 mL IJ SOAJ injection Inject 0.3 mLs (0.3 mg total) into the muscle as needed for anaphylaxis.   liraglutide (VICTOZA) 18 MG/3ML SOPN Inject 1.8 mg into the skin every evening.   lisinopril-hydrochlorothiazide (ZESTORETIC) 20-12.5 MG tablet Take 1 tablet by mouth 2 (two) times daily.   meloxicam (MOBIC) 15 MG tablet Take 1 tablet (15 mg total) by mouth daily.   NOVOTWIST 32G X 5 MM MISC USE AS DIRECTED   Pitavastatin Calcium (LIVALO) 2 MG TABS Take 1 tablet (2 mg total) by mouth daily.   RESTASIS 0.05 % ophthalmic emulsion Place 1 drop into both eyes 2 (two) times daily as needed (dry eyes).    traMADol (ULTRAM) 50 MG tablet Take 1 tablet (50 mg total) by mouth every 8 (eight) hours as needed.   zinc gluconate 50 MG tablet Take 50 mg by mouth daily.   No facility-administered encounter medications on file as of 05/10/2021.  Allergies (verified) Patient has no known allergies.   History: Past Medical History:  Diagnosis Date   Anxiety    COPD (chronic obstructive pulmonary disease) (HCC)    DDD (degenerative disc disease)    Depression    Diabetes mellitus without complication ([place])    DVT (deep venous thrombosis) ([place]) 2017   stopped Xarelto in Feb 2020   Fibromyalgia    GERD (gastroesophageal reflux disease)    Hypertension    Vitamin D  deficiency disease    Past Surgical History:  Procedure Laterality Date   ABDOMINAL HYSTERECTOMY     CHOLECYSTECTOMY     COLONOSCOPY N/A 10/14/2019   Procedure: COLONOSCOPY;  Surgeon: Daneil Dolin, MD;  [facility];  Service: Endoscopy;  Laterality: N/A;  8:30am   FOOT SURGERY Bilateral    HAND SURGERY Bilateral    POLYPECTOMY  10/14/2019   Procedure: POLYPECTOMY;  Surgeon: Daneil Dolin, MD;  Location: [facility];  Service: Endoscopy;;   SPINE SURGERY     TONSILLECTOMY     Family History  Problem Relation Age of Onset   Cancer Mother    Heart attack Father    Hypertension Son    Heart attack Sister    Colon cancer Neg Hx    Social History   Socioeconomic History   Marital status: Married    Spouse name: Denyse Amass    Number of children: 1   Years of education: 11th   Highest education level: 11th grade  Occupational History   Occupation: Retired     Comment: CNA  Tobacco Use   Smoking status: Former    Packs/day: 0.50    Years: 10.00    Pack years: 5.00    Types: Cigarettes    Quit date: 06/22/2012    Years since quitting: 8.8   Smokeless tobacco: Never  Vaping Use   Vaping Use: Never used  Substance and Sexual Activity   Alcohol use: Not Currently   Drug use: Not Currently   Sexual activity: Not Currently  Other Topics Concern   Not on file  Social History Narrative   Lives with husband of over 41 years   Son lives on the same street.   Social Determinants of Health   Financial Resource Strain: Low Risk    Difficulty of Paying Living Expenses: Not hard at all  Food Insecurity: No Food Insecurity   Worried About Charity fundraiser in the Last Year: Never true   [place] in the Last Year: Never true  Transportation Needs: No Transportation Needs   Lack of Transportation (Medical): No   Lack of Transportation (Non-Medical): No  Physical Activity: Insufficiently Active   Days of Exercise per Week: 7 days   Minutes of Exercise per  Session: 20 min  Stress: No Stress Concern Present   Feeling of Stress : Not at all  Social Connections: Moderately Integrated   Frequency of Communication with Friends and Family: More than three times a week   Frequency of Social Gatherings with Friends and Family: More than three times a week   Attends Religious Services: More than 4 times per year   Active Member of [employer] or Organizations: No   Attends Archivist Meetings: Never   Marital Status: Married    Tobacco Counseling Counseling given: Not Answered   Clinical Intake:  Pre-visit preparation completed: Yes  Pain : No/denies pain     BMI - recorded: 31.89 Nutritional Status: BMI >  30  Obese Nutritional Risks: None Diabetes: Yes CBG done?: No Did pt. bring in CBG monitor from home?: No  How often do you need to have someone help you when you read instructions, pamphlets, or other written materials from your doctor or pharmacy?: 1 - Never  Diabetic? Nutrition Risk Assessment:  Has the patient had any N/V/D within the last 2 months?  No  Does the patient have any non-healing wounds?  No  Has the patient had any unintentional weight loss or weight gain?  No   Diabetes:  Is the patient diabetic?  Yes  If diabetic, was a CBG obtained today?  No  Did the patient bring in their glucometer from home?  No  How often do you monitor your CBG's? Once every 2-3 days.   Financial Strains and Diabetes Management:  Are you having any financial strains with the device, your supplies or your medication? No .  Does the patient want to be seen by Chronic Care Management for management of their diabetes?  No  Would the patient like to be referred to a Nutritionist or for Diabetic Management?  No   Diabetic Exams:  Diabetic Eye Exam:  Overdue for diabetic eye exam. Pt has been advised about the importance in completing this exam. Declines referral.  Diabetic Foot Exam: Completed 04/01/2020. Pt has been advised about  the importance in completing this exam. Pt is scheduled for diabetic foot exam on 05/2021.    Interpreter Needed?: No  Information entered by :: Zelina Jimerson, LPN   Activities of Daily Living In your present state of health, do you have any difficulty performing the following activities: 05/10/2021  Hearing? N  Vision? N  Difficulty concentrating or making decisions? Y  Comment sometimes  Walking or climbing stairs? N  Dressing or bathing? N  Doing errands, shopping? N  Preparing Food and eating ? N  Using the Toilet? N  In the past six months, have you accidently leaked urine? N  Do you have problems with loss of bowel control? N  Managing your Medications? N  Managing your Finances? N  Housekeeping or managing your Housekeeping? N  Some recent data might be hidden    Patient Care Team: Sharion Balloon, FNP as PCP - General (Nurse Practitioner) Lavera Guise, [facility] as Pharmacist (Family Medicine)  Indicate any recent Medical Services you may have received from other than [facility] providers in the past year (date may be approximate).     Assessment:   This is a routine wellness examination for Holly Price.  Hearing/Vision screen Hearing Screening - Comments:: Denies hearing difficulties  Vision Screening - Comments:: Denies vision difficulties - no eye doctor at this time  Dietary issues and exercise activities discussed: Current Exercise Habits: Home exercise routine, Type of exercise: walking, Time (Minutes): 20, Frequency (Times/Week): 7, Weekly Exercise (Minutes/Week): 140, Intensity: Mild, Exercise limited by: orthopedic condition(s);neurologic condition(s)   Goals Addressed             This Visit's Progress    AWV   On track    05/04/2020 AWV Goal: Diabetes Management  Patient will maintain an A1C level below 8.0 Patient will not develop any diabetic foot complications Patient will not experience any hypoglycemic episodes over the next 3 months Patient will notify  our office of any CBG readings outside of the provider recommended range by calling [phone] Patient will adhere to provider recommendations for diabetes management  Patient Self Management Activities take all medications as prescribed  and report any negative side effects monitor and record blood sugar readings as directed adhere to a low carbohydrate diet that incorporates lean proteins, vegetables, whole grains, low glycemic fruits check feet daily noting any sores, cracks, injuries, or callous formations see PCP or podiatrist if she notices any changes in her legs, feet, or toenails Patient will visit PCP and have an A1C level checked every 3 to 6 months as directed  have a yearly eye exam to monitor for vascular changes associated with diabetes and will request that the report be sent to her pcp.  consult with her PCP regarding any changes in her health or new or worsening symptoms   05/04/2020 AWV Goal: Exercise for General Health  Patient will verbalize understanding of the benefits of increased physical activity: Exercising regularly is important. It will improve your overall fitness, flexibility, and endurance. Regular exercise also will improve your overall health. It can help you control your weight, reduce stress, and improve your bone density. Over the next year, patient will increase physical activity as tolerated with a goal of at least 150 minutes of moderate physical activity per week.  You can tell that you are exercising at a moderate intensity if your heart starts beating faster and you start breathing faster but can still hold a conversation. Moderate-intensity exercise ideas include: Walking 1 mile (1.6 km) in about 15 minutes Biking Hiking Golfing Dancing Water aerobics Patient will verbalize understanding of everyday activities that increase physical activity by providing examples like the following: Yard work, such as: Geophysical data processor Gardening Washing windows or floors Patient will be able to explain general safety guidelines for exercising:  Before you start a new exercise program, talk with your health care provider. Do not exercise so much that you hurt yourself, feel dizzy, or get very short of breath. Wear comfortable clothes and wear shoes with good support. Drink plenty of water while you exercise to prevent dehydration or heat stroke. Work out until your breathing and your heartbeat get faster.        Depression Screen PHQ 2/9 Scores 05/10/2021 03/14/2021 12/01/2020 05/04/2020 04/01/2020 09/04/2019 05/07/2019  PHQ - 2 Score 0 0 0 0 0 0 0  PHQ- 9 Score 0 0 0 - - - -    Fall Risk Fall Risk  05/10/2021 03/14/2021 12/01/2020 05/04/2020 04/01/2020  Falls in the past year? 0 0 0 0 0  Number falls in past yr: 0 - - - -  Injury with Fall? 0 - - - -  Risk for fall due to : Orthopedic patient;Medication side effect - - - -  Follow up Falls prevention discussed - - - -    FALL RISK PREVENTION PERTAINING TO THE HOME:  Any stairs in or around the home? No  If so, are there any without handrails? No  Home free of loose throw rugs in walkways, pet beds, electrical cords, etc? Yes  Adequate lighting in your home to reduce risk of falls? Yes   ASSISTIVE DEVICES UTILIZED TO PREVENT FALLS:  Life alert? No  Use of a cane, walker or w/c? No  Grab bars in the bathroom? Yes  Shower chair or bench in shower? No  Elevated toilet seat or a handicapped toilet? No   TIMED UP AND GO:  Was the test performed? No . Telephonic visit  Cognitive Function: MMSE - Mini Mental State Exam 05/04/2019  Orientation to time  3  Orientation to Place 5  Registration 3  Attention/ Calculation 5  Recall 0  Language- name 2 objects 2  Language- repeat 1  Language- follow 3 step command 3  Language- read & follow direction 1  Write a sentence 1  Copy design 1  Total score 25      6CIT Screen 05/10/2021 05/04/2020  What Year? 0 points 0 points  What month? 0 points 0 points  What time? 0 points 0 points  Count back from 20 0 points 0 points  Months in reverse 2 points 2 points  Repeat phrase 4 points 0 points  Total Score 6 2    Immunizations Immunization History  Administered Date(s) Administered   Fluad Quad(high Dose 65+) 04/01/2020, 03/14/2021   Influenza, High Dose Seasonal PF 06/16/2018   Influenza,inj,Quad PF,6+ Mos 02/25/2013, 02/24/2014, 04/15/2015   Moderna Sars-Covid-2 Vaccination 07/22/2019, 08/19/2019   Pneumococcal Conjugate-13 02/15/2014   Pneumococcal Polysaccharide-23 04/29/2012, 06/16/2018   Tdap 04/29/2012   Zoster Recombinat (Shingrix) 12/01/2020   Zoster, Live 11/02/2013    TDAP status: Up to date  Flu Vaccine status: Up to date  Pneumococcal vaccine status: Up to date  Covid-19 vaccine status: Completed vaccines  Qualifies for Shingles Vaccine? Yes   Zostavax completed Yes   Shingrix Completed?: No.    Education has been provided regarding the importance of this vaccine. Patient has been advised to call insurance company to determine out of pocket expense if they have not yet received this vaccine. Advised may also receive vaccine at local pharmacy or Health Dept. Verbalized acceptance and understanding.  Screening Tests Health Maintenance  Topic Date Due   MAMMOGRAM  05/28/2013   OPHTHALMOLOGY EXAM  08/29/2016   COVID-19 Vaccine (3 - Moderna risk series) 09/16/2019   FOOT EXAM  04/01/2021   Zoster Vaccines- Shingrix (2 of 2) 06/14/2021 (Originally 01/26/2021)   HEMOGLOBIN A1C  09/12/2021   TETANUS/TDAP  04/29/2022   DEXA SCAN  06/24/2023   COLONOSCOPY (Pts 45-33yr Insurance coverage will need to be confirmed)  10/13/2029   Pneumonia Vaccine 68 Years old  Completed   INFLUENZA VACCINE  Completed   Hepatitis C Screening  Completed   HPV VACCINES  Aged Out    Health Maintenance  Health Maintenance Due  Topic  Date Due   MAMMOGRAM  05/28/2013   OPHTHALMOLOGY EXAM  08/29/2016   COVID-19 Vaccine (3 - Moderna risk series) 09/16/2019   FOOT EXAM  04/01/2021    Colorectal cancer screening: Type of screening: Colonoscopy. Completed 10/14/2019. Repeat every 10/14/2019 years  Mammogram status: Ordered 05/10/2021. Pt provided with contact info and advised to call to schedule appt.   Bone Density status: Completed 06/23/2018. Results reflect: Bone density results: NORMAL. Repeat every 5 years.  Lung Cancer Screening: (Low Dose CT Chest recommended if Age 68-80years, 30 pack-year currently smoking OR have quit w/in 15years.) does not qualify.   Additional Screening:  Hepatitis C Screening: does qualify; Completed 11/22/2015  Vision Screening: Recommended annual ophthalmology exams for early detection of glaucoma and other disorders of the eye. Is the patient up to date with their annual eye exam?  No  Who is the provider or what is the name of the office in which the patient attends annual eye exams? none If pt is not established with a provider, would they like to be referred to a provider to establish care? No .   Dental Screening: Recommended annual dental exams for proper oral hygiene  Community Resource Referral /  Chronic Care Management: CRR required this visit?  No   CCM required this visit?  No      Plan:     I have personally reviewed and noted the following in the patients chart:   Medical and social history Use of alcohol, tobacco or illicit drugs  Current medications and supplements including opioid prescriptions.  Functional ability and status Nutritional status Physical activity Advanced directives List of other physicians Hospitalizations, surgeries, and ER visits in previous 12 months Vitals Screenings to include cognitive, depression, and falls Referrals and appointments  In addition, I have reviewed and discussed with patient certain preventive protocols, quality  metrics, and best practice recommendations. A written personalized care plan for preventive services as well as general preventive health recommendations were provided to patient.     Sandrea Hammond, LPN   [id number]   Nurse Notes: none

## 2021-05-16 ENCOUNTER — Telehealth: Payer: Medicare Other

## 2021-05-20 ENCOUNTER — Other Ambulatory Visit: Payer: Self-pay | Admitting: Family

## 2021-05-20 DIAGNOSIS — M549 Dorsalgia, unspecified: Secondary | ICD-10-CM

## 2021-05-24 ENCOUNTER — Ambulatory Visit (INDEPENDENT_AMBULATORY_CARE_PROVIDER_SITE_OTHER): Payer: Medicare Other | Admitting: Pharmacist

## 2021-05-24 DIAGNOSIS — E119 Type 2 diabetes mellitus without complications: Secondary | ICD-10-CM

## 2021-05-24 DIAGNOSIS — E1169 Type 2 diabetes mellitus with other specified complication: Secondary | ICD-10-CM

## 2021-05-24 NOTE — Patient Instructions (Addendum)
Visit Information  Thank you for taking time to visit with me today. Please don't hesitate to contact me if I can be of assistance to you before our next scheduled telephone appointment.  Following are the goals we discussed today:  Current Barriers:  Unable to independently afford treatment regimen Suboptimal therapeutic regimen for T2DM  Pharmacist Clinical Goal(s):  patient will verbalize ability to afford treatment regimen adhere to plan to optimize therapeutic regimen for T2DM as evidenced by report of adherence to recommended medication management changes through collaboration with PharmD and provider.    Interventions: 1:1 collaboration with Sharion Balloon, FNP regarding development and update of comprehensive plan of care as evidenced by provider attestation and co-signature Inter-disciplinary care team collaboration (see longitudinal plan of care) Comprehensive medication review performed; medication list updated in electronic medical record  Diabetes: New goal. Controlled; current treatment: VICTOZA 1.8mg  daily;  Denies personal and family history of Medullary thyroid cancer (MTC) Patient wishes to continue current regimen Would like to transition to once weekly GLP1 if patient would like to transition at some point Enrolled in [employer] patient assistance program today Current glucose readings: fasting glucose: <130, post prandial glucose: <180 Denies hypoglycemic/hyperglycemic symptoms Discussed meal planning options and Plate method for healthy eating Avoid sugary drinks and desserts Incorporate balanced protein, non starchy veggies, 1 serving of carbohydrate with each meal Increase water intake Increase physical activity as able Current exercise: n/a Educated on medications-purpose and side effects; diet-heart healthy/plate method Recommended heart healthy/plate method--will help with hyperlipidemia as well Assessed patient finances. Enrolled in [employer]  patient assistance program   Patient Goals/Self-Care Activities patient will:  - take medications as prescribed as evidenced by patient report and record review check glucose DAILY (FASTING), document, and provide at future appointments collaborate with provider on medication access solutions engage in dietary modifications by [place]    Please call the care guide team at [phone] if you need to cancel or reschedule your appointment.    The patient verbalized understanding of instructions, educational materials, and care plan provided today and declined offer to receive copy of patient instructions, educational materials, and care plan.   Signature Regina Eck, PharmD, BCPS Clinical Pharmacist, [place]  [facility] Phone [phone]

## 2021-05-24 NOTE — Progress Notes (Signed)
Chronic Care Management Pharmacy Note  05/24/2021 Name:  MELANE WINDHOLZ MRN:  [id number] DOB:  04/05/1951  Summary: t2dm, hld  Recommendations/Changes made from today's visit: Diabetes: New goal. Controlled; current treatment: VICTOZA 1.38m daily;  Denies personal and family history of Medullary thyroid cancer (MTC) Patient wishes to continue current regimen Would like to transition to once weekly GLP1 if patient would like to transition at some point Enrolled in novo nordisk patient assistance program today Current glucose readings: fasting glucose: <130, post prandial glucose: <180 Denies hypoglycemic/hyperglycemic symptoms Discussed meal planning options and Plate method for healthy eating Avoid sugary drinks and desserts Incorporate balanced protein, non starchy veggies, 1 serving of carbohydrate with each meal Increase water intake Increase physical activity as able Current exercise: n/a Educated on medications-purpose and side effects; diet-heart healthy/plate method Recommended heart healthy/plate method--will help with hyperlipidemia as well Assessed patient finances. Enrolled in novo nordisk patient assistance program   Patient Goals/Self-Care Activities patient will:  - take medications as prescribed as evidenced by patient report and record review check glucose DAILY (FASTING), document, and provide at future appointments collaborate with provider on medication access solutions engage in dietary modifications by FOLLOWING HEALTHY PLATE METHOD   Plan: 4 months  Subjective: BTIMARIE LABELLis an 70y.o. year old female who is a primary patient of HSharion Balloon FNP.  The CCM team was consulted for assistance with disease management and care coordination needs.    Engaged with patient by telephone for initial visit in response to provider referral for pharmacy case management and/or care coordination services.   Consent to Services:  The patient was given  the following information about Chronic Care Management services today, agreed to services, and gave verbal consent: 1. CCM service includes personalized support from designated clinical staff supervised by the primary care provider, including individualized plan of care and coordination with other care providers 2. 24/7 contact phone numbers for assistance for urgent and routine care needs. 3. Service will only be billed when office clinical staff spend 20 minutes or more in a month to coordinate care. 4. Only one practitioner may furnish and bill the service in a calendar month. 5.The patient may stop CCM services at any time (effective at the end of the month) by phone call to the office staff. 6. The patient will be responsible for cost sharing (co-pay) of up to 20% of the service fee (after annual deductible is met). Patient agreed to services and consent obtained.  Patient Care Team: HSharion Balloon FNP as PCP - General (Nurse Practitioner) [facility] ([facility])  Objective:  Lab Results  Component Value Date   CREATININE 0.76 03/14/2021   CREATININE 0.72 12/01/2020   CREATININE 0.81 04/01/2020    Lab Results  Component Value Date   HGBA1C 6.9 (H) 03/14/2021   Last diabetic Eye exam:  Lab Results  Component Value Date/Time   HMDIABEYEEXA No Retinopathy 08/30/2015 12:00 AM    Last diabetic Foot exam: No results found for: HMDIABFOOTEX      Component Value Date/Time   CHOL 166 04/01/2020 1000   CHOL 203 (H) 11/14/2012 0932   TRIG 122 04/01/2020 1000   TRIG 143 11/14/2012 0932   HDL 59 04/01/2020 1000   HDL 42 11/14/2012 0932   CHOLHDL 2.8 04/01/2020 1000   CHOLHDL 3.0 06/16/2018 1254   VLDL 36 06/16/2018 1254   LDLCALC 85 04/01/2020 1000   LDLCALC 132 (H) 11/14/2012 0932  Hepatic Function Latest Ref Rng & Units 03/14/2021 12/01/2020 04/01/2020  Total Protein 6.0 - 8.5 g/dL 6.9 6.5 7.0  Albumin 3.8 - 4.8 g/dL 4.5 4.4 4.7  AST 0 - 40 IU/L  _0 ALT 0 - 32 IU/L _1 Alk Phosphatase 44 - 121 IU/L 44 39(L) 45  Total Bilirubin 0.0 - 1.2 mg/dL 0.4 0.4 0.4  Bilirubin, Direct 0.00 - 0.40 mg/dL - - -    Lab Results  Component Value Date/Time   TSH 1.110 02/06/2019 09:12 AM   TSH 1.102 06/16/2018 12:55 PM   TSH 0.910 06/16/2018 10:49 AM    CBC Latest Ref Rng & Units 12/01/2020 04/01/2020 02/06/2019  WBC 3.4 - 10.8 x10E3/uL 8.4 10.4 9.7  Hemoglobin 11.1 - 15.9 g/dL 16.2(H) 15.6 15.6  Hematocrit 34.0 - 46.6 % 48.3(H) 46.6 47.2(H)  Platelets 150 - 450 x10E3/uL 256 259 280    Lab Results  Component Value Date/Time   VD25OH 95.5 02/06/2019 09:12 AM   VD25OH 83.7 06/16/2018 12:54 PM    Clinical ASCVD: No  The 10-year ASCVD risk score (Arnett DK, et al., 2019) is: 21.9%   Values used to calculate the score:     Age: 70 years     Sex: Female     Is Non-Hispanic African American: No     Diabetic: Yes     Tobacco smoker: No     Systolic Blood Pressure: 242 mmHg     Is BP treated: Yes     HDL Cholesterol: 59 mg/dL     Total Cholesterol: 166 mg/dL    Other: (CHADS2VASc if Afib, PHQ9 if depression, MMRC or CAT for COPD, ACT, DEXA)  Social History   Tobacco Use  Smoking Status Former   Packs/day: 0.50   Years: 10.00   Pack years: 5.00   Types: Cigarettes   Quit date: 06/22/2012   Years since quitting: 8.9  Smokeless Tobacco Never   BP Readings from Last 3 Encounters:  03/14/21 (!) 146/62  12/01/20 (!) 172/74  04/01/20 140/74   Pulse Readings from Last 3 Encounters:  03/14/21 97  12/01/20 67  04/01/20 77   Wt Readings from Last 3 Encounters:  05/10/21 180 lb (81.6 kg)  03/14/21 181 lb 12.8 oz (82.5 kg)  12/01/20 181 lb 6.4 oz (82.3 kg)    Assessment: Review of patient past medical history, allergies, medications, health status, including review of consultants reports, laboratory and other test data, was performed as part of comprehensive evaluation and provision of chronic care management services.    SDOH:  (Social Determinants of Health) assessments and interventions performed:    CCM Care Plan  No Known Allergies  Medications Reviewed Today     Reviewed by Lavera Guise, [facility] (Pharmacist) on 05/24/21 at 0934  Med List Status: <None>   Medication Order Taking? Sig Documenting Provider Last Dose Status Informant  ACCU-CHEK AVIVA PLUS test strip [id number] No TWICE DAILY Sharion Balloon, FNP Taking Active Self  acetaminophen (TYLENOL) 500 MG tablet [id number] No Take 1,000 mg by mouth every 6 (six) hours as needed for moderate pain or headache. [provider] Taking Active Self  amLODipine (NORVASC) 5 MG tablet [id number] No Take 1 tablet (5 mg total) by mouth daily. Sharion Balloon, FNP Taking Active   atenolol (TENORMIN) 50 MG tablet [id number] No Take 1 tablet (50 mg total) by mouth daily. Sharion Balloon, FNP Taking Active   Blood Glucose Monitoring Suppl (ACCU-CHEK AVIVA PLUS)  W/DEVICE KIT [id number] No 1 Device by Does not apply route 2 (two) times daily. Test blood sugar twice daily.dx.250.0 Evelina Dun A, FNP Taking Active Self  Calcium Citrate-Vitamin D (CALCIUM + D PO) [id number] No Take 1 tablet by mouth daily. [provider] Taking Active Self  diclofenac Sodium (VOLTAREN) 1 % GEL [id number] No Apply 2 g topically 4 (four) times daily. Sharion Balloon, FNP Taking Active   EPINEPHrine 0.3 mg/0.3 mL IJ SOAJ injection [id number] No Inject 0.3 mLs (0.3 mg total) into the muscle as needed for anaphylaxis. Sharion Balloon, FNP Taking Active Self  liraglutide (VICTOZA) 18 MG/3ML SOPN [id number] No Inject 1.8 mg into the skin every evening. Sharion Balloon, FNP Taking Active   lisinopril-hydrochlorothiazide (ZESTORETIC) 20-12.5 MG tablet [id number] No Take 1 tablet by mouth 2 (two) times daily. Sharion Balloon, FNP Taking Active   meloxicam (MOBIC) 15 MG tablet [id number] No Take 1 tablet (15 mg total) by mouth daily. Sharion Balloon, FNP Taking Active    NOVOTWIST 32G X 5 MM MISC [id number] No USE AS DIRECTED Sharion Balloon, FNP Taking Active Self  Pitavastatin Calcium (LIVALO) 2 MG TABS [id number] No Take 1 tablet (2 mg total) by mouth daily. Sharion Balloon, FNP Taking Active   RESTASIS 0.05 % ophthalmic emulsion [id number] No Place 1 drop into both eyes 2 (two) times daily as needed (dry eyes).  [provider] Taking Active Self  traMADol (ULTRAM) 50 MG tablet [id number] No Take 1 tablet (50 mg total) by mouth every 8 (eight) hours as needed. Sharion Balloon, FNP Taking Active   zinc gluconate 50 MG tablet [id number] No Take 50 mg by mouth daily. [provider] Taking Active Self            Patient Active Problem List   Diagnosis Date Noted   Positive colorectal cancer screening using Cologuard test 08/21/2019   Dementia ([place]) 02/06/2019   Obesity (BMI 30-39.9) 11/22/2015   Diabetic neuropathy ([place]) 11/22/2015   History of DVT (deep vein thrombosis) 08/19/2015   Hyperlipidemia associated with type 2 diabetes mellitus ([place]) 02/15/2014   Acromioclavicular arthrosis 12/25/2013   Cervical radiculitis 07/17/2013   Pain in joint, shoulder region 07/17/2013   Vitamin D deficiency 11/13/2012   Hypertension associated with diabetes ([place]) 11/13/2012   DM (diabetes mellitus) ([place]) 59/93/5701   Metabolic syndrome 77/93/9030   Cervical post-laminectomy syndrome 11/12/2012   Lumbosacral spondylosis without myelopathy 11/12/2012   Mid back pain 06/25/2012    Immunization History  Administered Date(s) Administered   Fluad Quad(high Dose 65+) 04/01/2020, 03/14/2021   Influenza, High Dose Seasonal PF 06/16/2018   Influenza,inj,Quad PF,6+ Mos 02/25/2013, 02/24/2014, 04/15/2015   Moderna Sars-Covid-2 Vaccination 07/22/2019, 08/19/2019   Pneumococcal Conjugate-13 02/15/2014   Pneumococcal Polysaccharide-23 04/29/2012, 06/16/2018   Tdap 04/29/2012   Zoster Recombinat (Shingrix) 12/01/2020   Zoster, Live 11/02/2013     Conditions to be addressed/monitored: HLD and DMII  Care Plan : PHARMD MEDICATION MANAGEMENT  Updates made by Lavera Guise, [place] since 05/24/2021 12:00 AM     Problem: DISEASE PROGRESSION PREVENTION      Long-Range Goal: T2DM   This Visit's Progress: On track  Priority: High  Note:   Current Barriers:  Unable to independently afford treatment regimen Suboptimal therapeutic regimen for T2DM  Pharmacist Clinical Goal(s):  patient will verbalize ability to afford treatment regimen adhere to plan to optimize therapeutic regimen for T2DM as evidenced by report of adherence to recommended medication management changes  through collaboration with PharmD and provider.    Interventions: 1:1 collaboration with Sharion Balloon, FNP regarding development and update of comprehensive plan of care as evidenced by provider attestation and co-signature Inter-disciplinary care team collaboration (see longitudinal plan of care) Comprehensive medication review performed; medication list updated in electronic medical record  Diabetes: New goal. Controlled; current treatment: VICTOZA 1.78m daily;  Denies personal and family history of Medullary thyroid cancer (MTC) Patient wishes to continue current regimen Would like to transition to once weekly GLP1 if patient would like to transition at some point Enrolled in novo nordisk patient assistance program today Current glucose readings: fasting glucose: <130, post prandial glucose: <180 Denies hypoglycemic/hyperglycemic symptoms Discussed meal planning options and Plate method for healthy eating Avoid sugary drinks and desserts Incorporate balanced protein, non starchy veggies, 1 serving of carbohydrate with each meal Increase water intake Increase physical activity as able Current exercise: n/a Educated on medications-purpose and side effects; diet-heart healthy/plate method Recommended heart healthy/plate method--will help with  hyperlipidemia as well Assessed patient finances. Enrolled in novo nordisk patient assistance program   Patient Goals/Self-Care Activities patient will:  - take medications as prescribed as evidenced by patient report and record review check glucose DAILY (FASTING), document, and provide at future appointments collaborate with provider on medication access solutions engage in dietary modifications by FOLLOWING HEALTHY PLATE METHOD      Medication Assistance: Application for victoza  medication assistance program. in process.  Anticipated assistance start date tbd.  See plan of care for additional detail.  Patient[facility]:  [id number]: [phone]: [phone] Follow Up:  Patient agrees to Care Plan and Follow-up.  Plan: Telephone follow up appointment with care management team member scheduled for:  4 months   JRegina Eck PharmD, BCPS Clinical Pharmacist, [place] Phone [phone]

## 2021-05-26 ENCOUNTER — Other Ambulatory Visit: Payer: Self-pay | Admitting: Family

## 2021-05-26 DIAGNOSIS — Z1231 Encounter for screening mammogram for malignant neoplasm of breast: Secondary | ICD-10-CM

## 2021-05-27 DIAGNOSIS — E785 Hyperlipidemia, unspecified: Secondary | ICD-10-CM | POA: Diagnosis not present

## 2021-05-27 DIAGNOSIS — E1169 Type 2 diabetes mellitus with other specified complication: Secondary | ICD-10-CM | POA: Diagnosis not present

## 2021-05-30 ENCOUNTER — Other Ambulatory Visit: Payer: Self-pay | Admitting: Family

## 2021-05-30 DIAGNOSIS — M961 Postlaminectomy syndrome, not elsewhere classified: Secondary | ICD-10-CM

## 2021-05-30 DIAGNOSIS — M542 Cervicalgia: Secondary | ICD-10-CM

## 2021-05-30 DIAGNOSIS — M549 Dorsalgia, unspecified: Secondary | ICD-10-CM

## 2021-05-30 NOTE — Telephone Encounter (Signed)
Medication is not on patients medication list please review.

## 2021-06-14 NOTE — Progress Notes (Signed)
Received notification from [employer] regarding approval for [place]. Patient assistance approved UNTIL 04/26/22.  NEXT SHIPMENT BEGINS PROCESSING 07/26/21  Phone: [phone]

## 2021-06-16 ENCOUNTER — Encounter: Payer: Self-pay | Admitting: Family

## 2021-06-16 ENCOUNTER — Ambulatory Visit (INDEPENDENT_AMBULATORY_CARE_PROVIDER_SITE_OTHER): Payer: Medicare Other | Admitting: Family

## 2021-06-16 VITALS — BP 145/60 | HR 58 | Temp 97.7°F | Ht 63.0 in | Wt 187.0 lb

## 2021-06-16 DIAGNOSIS — E785 Hyperlipidemia, unspecified: Secondary | ICD-10-CM

## 2021-06-16 DIAGNOSIS — E1159 Type 2 diabetes mellitus with other circulatory complications: Secondary | ICD-10-CM

## 2021-06-16 DIAGNOSIS — E0800 Diabetes mellitus due to underlying condition with hyperosmolarity without nonketotic hyperglycemic-hyperosmolar coma (NKHHC): Secondary | ICD-10-CM

## 2021-06-16 DIAGNOSIS — E559 Vitamin D deficiency, unspecified: Secondary | ICD-10-CM

## 2021-06-16 DIAGNOSIS — E1142 Type 2 diabetes mellitus with diabetic polyneuropathy: Secondary | ICD-10-CM

## 2021-06-16 DIAGNOSIS — Z79899 Other long term (current) drug therapy: Secondary | ICD-10-CM

## 2021-06-16 DIAGNOSIS — M549 Dorsalgia, unspecified: Secondary | ICD-10-CM | POA: Diagnosis not present

## 2021-06-16 DIAGNOSIS — E1169 Type 2 diabetes mellitus with other specified complication: Secondary | ICD-10-CM

## 2021-06-16 DIAGNOSIS — Z23 Encounter for immunization: Secondary | ICD-10-CM

## 2021-06-16 DIAGNOSIS — E669 Obesity, unspecified: Secondary | ICD-10-CM

## 2021-06-16 DIAGNOSIS — I152 Hypertension secondary to endocrine disorders: Secondary | ICD-10-CM

## 2021-06-16 DIAGNOSIS — M5412 Radiculopathy, cervical region: Secondary | ICD-10-CM

## 2021-06-16 DIAGNOSIS — F0394 Unspecified dementia, unspecified severity, with anxiety: Secondary | ICD-10-CM

## 2021-06-16 DIAGNOSIS — Z86718 Personal history of other venous thrombosis and embolism: Secondary | ICD-10-CM

## 2021-06-16 LAB — CBC WITH DIFFERENTIAL/PLATELET
Basophils Absolute: 0.1 10*3/uL (ref 0.0–0.2)
Basos: 1 %
EOS (ABSOLUTE): 0.2 10*3/uL (ref 0.0–0.4)
Eos: 3 %
Hematocrit: 45.8 % (ref 34.0–46.6)
Hemoglobin: 15.6 g/dL (ref 11.1–15.9)
Immature Grans (Abs): 0 10*3/uL (ref 0.0–0.1)
Immature Granulocytes: 1 %
Lymphocytes Absolute: 2.3 10*3/uL (ref 0.7–3.1)
Lymphs: 26 %
MCH: 32.6 pg (ref 26.6–33.0)
MCHC: 34.1 g/dL (ref 31.5–35.7)
MCV: 96 fL (ref 79–97)
Monocytes Absolute: 0.9 10*3/uL (ref 0.1–0.9)
Monocytes: 10 %
Neutrophils Absolute: 5.3 10*3/uL (ref 1.4–7.0)
Neutrophils: 59 %
Platelets: 232 10*3/uL (ref 150–450)
RBC: 4.79 x10E6/uL (ref 3.77–5.28)
RDW: 12.1 % (ref 11.7–15.4)
WBC: 8.8 10*3/uL (ref 3.4–10.8)

## 2021-06-16 LAB — CMP14+EGFR
ALT: 18 IU/L (ref 0–32)
AST: 19 IU/L (ref 0–40)
Albumin/Globulin Ratio: 2.2 (ref 1.2–2.2)
Albumin: 4.6 g/dL (ref 3.8–4.8)
Alkaline Phosphatase: 42 IU/L — ABNORMAL LOW (ref 44–121)
BUN/Creatinine Ratio: 26 (ref 12–28)
BUN: 20 mg/dL (ref 8–27)
Bilirubin Total: 0.5 mg/dL (ref 0.0–1.2)
CO2: 27 mmol/L (ref 20–29)
Calcium: 9.6 mg/dL (ref 8.7–10.3)
Chloride: 99 mmol/L (ref 96–106)
Creatinine, Ser: 0.78 mg/dL (ref 0.57–1.00)
Globulin, Total: 2.1 g/dL (ref 1.5–4.5)
Glucose: 155 mg/dL — ABNORMAL HIGH (ref 70–99)
Potassium: 4.6 mmol/L (ref 3.5–5.2)
Sodium: 144 mmol/L (ref 134–144)
Total Protein: 6.7 g/dL (ref 6.0–8.5)
eGFR: 83 mL/min/{1.73_m2} (ref >59)

## 2021-06-16 LAB — BAYER DCA HB A1C WAIVED: HB A1C (BAYER DCA - WAIVED): 7 % — ABNORMAL HIGH (ref 4.8–5.6)

## 2021-06-16 MED ORDER — TRAMADOL HCL 50 MG PO TABS: 50.0000 mg | ORAL_TABLET | Freq: Three times a day (TID) | ORAL | 0 refills | Status: DC | PRN

## 2021-06-16 NOTE — Progress Notes (Signed)
° °Subjective:  ° ° Patient ID: Holly Price, female    DOB: 04/08/1951, 70 y.o.   MRN: [id number] ° °Chief Complaint  °Patient presents with  ° Medical Management of Chronic Issues  ° °PT presents to the office today for chronic follow up. She has a hx of DVT and was taken off her xarelto in February 2020.  °  °She states her memory is stable at this time.  °Hypertension °This is a chronic problem. The current episode started more than 1 year ago. The problem has been waxing and waning since onset. The problem is uncontrolled. Pertinent negatives include no blurred vision, malaise/fatigue, peripheral edema or shortness of breath. Risk factors for coronary artery disease include dyslipidemia, obesity and sedentary lifestyle. The current treatment provides moderate improvement.  °Hyperlipidemia °This is a chronic problem. The current episode started more than 1 year ago. The problem is controlled. Exacerbating diseases include obesity. Pertinent negatives include no shortness of breath. Current antihyperlipidemic treatment includes statins. The current treatment provides moderate improvement of lipids. Risk factors for coronary artery disease include dyslipidemia, diabetes mellitus, hypertension, a sedentary lifestyle, post-menopausal and obesity.  °Diabetes °She presents for her follow-up diabetic visit. She has type 2 diabetes mellitus. There are no hypoglycemic associated symptoms. Associated symptoms include foot paresthesias. Pertinent negatives for diabetes include no blurred vision. Symptoms are stable. Risk factors for coronary artery disease include dyslipidemia, diabetes mellitus, hypertension, sedentary lifestyle and post-menopausal. She is following a generally unhealthy diet. (Does not check BS at home) An ACE inhibitor/angiotensin II receptor blocker is being taken.  °Arthritis °Presents for follow-up visit. She complains of pain and stiffness. The symptoms have been stable. Affected locations  include the left hip, right hip, right MCP and left MCP. Her pain is at a severity of 7/10.  °Back Pain °This is a chronic problem. The current episode started more than 1 year ago. The problem occurs intermittently. The problem has been waxing and waning since onset. The pain is present in the lumbar spine and thoracic spine. The quality of the pain is described as aching and burning. The pain is at a severity of 8/10. The pain is moderate.  ° ° ° °Review of Systems  °Constitutional:  Negative for malaise/fatigue.  °Eyes:  Negative for blurred vision.  °Respiratory:  Negative for shortness of breath.   °Musculoskeletal:  Positive for arthritis, back pain and stiffness.  °All other systems reviewed and are negative. ° °   °Objective:  ° Physical Exam °Vitals reviewed.  °Constitutional:   °   General: She is not in acute distress. °   Appearance: She is well-developed. She is obese.  °HENT:  °   Head: Normocephalic and atraumatic.  °   Right Ear: Tympanic membrane normal.  °   Left Ear: Tympanic membrane normal.  °Eyes:  °   Pupils: Pupils are equal, round, and reactive to light.  °Neck:  °   Thyroid: No thyromegaly.  °Cardiovascular:  °   Rate and Rhythm: Normal rate and regular rhythm.  °   Heart sounds: Normal heart sounds. No murmur heard. °Pulmonary:  °   Effort: Pulmonary effort is normal. No respiratory distress.  °   Breath sounds: Normal breath sounds. No wheezing.  °Abdominal:  °   General: Bowel sounds are normal. There is no distension.  °   Palpations: Abdomen is soft.  °   Tenderness: There is no abdominal tenderness.  °Musculoskeletal:     °   General:   General: No tenderness. Normal range of motion.     Cervical back: Normal range of motion and neck supple.  Skin:    General: Skin is warm and dry.  Neurological:     Mental Status: She is alert and oriented to person, place, and time.     Cranial Nerves: No cranial nerve deficit.     Deep Tendon Reflexes: Reflexes are normal and symmetric.  Psychiatric:         Behavior: Behavior normal.        Thought Content: Thought content normal.        Judgment: Judgment normal.      BP (!) 145/60    Pulse (!) 58    Temp 97.7 F (36.5 C) (Temporal)    Ht 5' 3" (1.6 m)    Wt 187 lb (84.8 kg)    BMI 33.13 kg/m      Assessment & Plan:  Holly Price comes in today with chief complaint of Medical Management of Chronic Issues   Diagnosis and orders addressed:  1. Mid back pain - traMADol (ULTRAM) 50 MG tablet; Take 1 tablet (50 mg total) by mouth every 8 (eight) hours as needed.  Dispense: 20 tablet; Refill: 0 - CMP14+EGFR - CBC with Differential/Platelet  2. Hypertension associated with diabetes ([place]) - CMP14+EGFR - CBC with Differential/Platelet  3. Diabetes mellitus due to underlying condition with hyperosmolarity without coma, without long-term current use of insulin (HCC) - Bayer DCA Hb A1c Waived - CMP14+EGFR - CBC with Differential/Platelet  4. Hyperlipidemia associated with type 2 diabetes mellitus (HCC) - CMP14+EGFR - CBC with Differential/Platelet  5. Diabetic polyneuropathy associated with type 2 diabetes mellitus ([facility]CC) - CMP14+EGFR - CBC with Differential/Platelet  6. Cervical radiculitis - CMP14+EGFR - CBC with Differential/Platelet  7. Dementia with anxiety, unspecified dementia severity, unspecified dementia type - CMP14+EGFR - CBC with Differential/Platelet  8. History of DVT (deep vein thrombosis) - CMP14+EGFR - CBC with Differential/Platelet  9. Obesity (BMI 30-39.9) - CMP14+EGFR - CBC with Differential/Platelet  10. Vitamin D deficiency - CMP14+EGFR - CBC with Differential/Platelet   Labs pending Patient reviewed in [place] controlled database, no flags noted. Contract and drug screen up dated today.  Health Maintenance reviewed Diet and exercise encouraged  Follow up plan: 3 months    Evelina Dun, FNP

## 2021-06-16 NOTE — Patient Instructions (Signed)
Health Maintenance After Age 69 After age 69, you are at a higher risk for certain long-term diseases and infections as well as injuries from falls. Falls are a major cause of broken bones and head injuries in people who are older than age 69. Getting regular preventive care can help to keep you healthy and well. Preventive care includes getting regular testing and making lifestyle changes as recommended by your health care provider. Talk with your health care provider about: Which screenings and tests you should have. A screening is a test that checks for a disease when you have no symptoms. A diet and exercise plan that is right for you. What should I know about screenings and tests to prevent falls? Screening and testing are the best ways to find a health problem early. Early diagnosis and treatment give you the best chance of managing medical conditions that are common after age 69. Certain conditions and lifestyle choices may make you more likely to have a fall. Your health care provider may recommend: Regular vision checks. Poor vision and conditions such as cataracts can make you more likely to have a fall. If you wear glasses, make sure to get your prescription updated if your vision changes. Medicine review. Work with your health care provider to regularly review all of the medicines you are taking, including over-the-counter medicines. Ask your health care provider about any side effects that may make you more likely to have a fall. Tell your health care provider if any medicines that you take make you feel dizzy or sleepy. Strength and balance checks. Your health care provider may recommend certain tests to check your strength and balance while standing, walking, or changing positions. Foot health exam. Foot pain and numbness, as well as not wearing proper footwear, can make you more likely to have a fall. Screenings, including: Osteoporosis screening. Osteoporosis is a condition that causes  the bones to get weaker and break more easily. Blood pressure screening. Blood pressure changes and medicines to control blood pressure can make you feel dizzy. Depression screening. You may be more likely to have a fall if you have a fear of falling, feel depressed, or feel unable to do activities that you used to do. Alcohol use screening. Using too much alcohol can affect your balance and may make you more likely to have a fall. Follow these instructions at home: Lifestyle Do not drink alcohol if: Your health care provider tells you not to drink. If you drink alcohol: Limit how much you have to: 0-1 drink a day for women. 0-2 drinks a day for men. Know how much alcohol is in your drink. In the [place] one drink equals one 12 oz bottle of beer (355 mL), one 5 oz glass of wine (148 mL), or one 1 oz glass of hard liquor (44 mL). Do not use any products that contain nicotine or tobacco. These products include cigarettes, chewing tobacco, and vaping devices, such as e-cigarettes. If you need help quitting, ask your health care provider. Activity  Follow a regular exercise program to stay fit. This will help you maintain your balance. Ask your health care provider what types of exercise are appropriate for you. If you need a cane or walker, use it as recommended by your health care provider. Wear supportive shoes that have nonskid soles. Safety  Remove any tripping hazards, such as rugs, cords, and clutter. Install safety equipment such as grab bars in bathrooms and safety rails on stairs. Keep rooms and walkways   well-lit. General instructions Talk with your health care provider about your risks for falling. Tell your health care provider if: You fall. Be sure to tell your health care provider about all falls, even ones that seem minor. You feel dizzy, tiredness (fatigue), or off-balance. Take over-the-counter and prescription medicines only as told by your health care provider. These include  supplements. Eat a healthy diet and maintain a healthy weight. A healthy diet includes low-fat dairy products, low-fat (lean) meats, and fiber from whole grains, beans, and lots of fruits and vegetables. Stay current with your vaccines. Schedule regular health, dental, and eye exams. Summary Having a healthy lifestyle and getting preventive care can help to protect your health and wellness after age 69. Screening and testing are the best way to find a health problem early and help you avoid having a fall. Early diagnosis and treatment give you the best chance for managing medical conditions that are more common for people who are older than age 69. Falls are a major cause of broken bones and head injuries in people who are older than age 69. Take precautions to prevent a fall at home. Work with your health care provider to learn what changes you can make to improve your health and wellness and to prevent falls. This information is not intended to replace advice given to you by your health care provider. Make sure you discuss any questions you have with your health care provider. Document Revised: 10/03/2020 Document Reviewed: 10/03/2020 Elsevier Patient Education  2022 [employer]vier Inc.  

## 2021-06-20 ENCOUNTER — Telehealth: Payer: Self-pay

## 2021-06-20 NOTE — Telephone Encounter (Signed)
Victoza shipment came in, patient notified and will pick up today.

## 2021-06-22 LAB — TOXASSURE SELECT 13 (MW), URINE

## 2021-06-26 ENCOUNTER — Ambulatory Visit
Admission: RE | Admit: 2021-06-26 | Discharge: 2021-06-26 | Disposition: A | Discharge status: Home / Self Care | Payer: Medicare Other | Source: Ambulatory Visit | Attending: Family | Admitting: Family

## 2021-06-26 DIAGNOSIS — Z1231 Encounter for screening mammogram for malignant neoplasm of breast: Secondary | ICD-10-CM

## 2021-06-28 ENCOUNTER — Other Ambulatory Visit: Payer: Self-pay | Admitting: Family

## 2021-06-28 DIAGNOSIS — R928 Other abnormal and inconclusive findings on diagnostic imaging of breast: Secondary | ICD-10-CM

## 2021-07-27 ENCOUNTER — Other Ambulatory Visit: Payer: Self-pay | Admitting: Family

## 2021-07-27 ENCOUNTER — Telehealth: Payer: Self-pay | Admitting: Family

## 2021-07-27 DIAGNOSIS — E1159 Type 2 diabetes mellitus with other circulatory complications: Secondary | ICD-10-CM

## 2021-07-27 DIAGNOSIS — I152 Hypertension secondary to endocrine disorders: Secondary | ICD-10-CM

## 2021-07-27 NOTE — Telephone Encounter (Signed)
Patient called requesting to speak with Holly Price. Says it is about all of her medications and getting on an assistance plan for them. Patient says she thinks she is only on the plan for her Victoza but needs help with her other meds.  ? ?

## 2021-07-27 NOTE — Telephone Encounter (Signed)
Patient will have to schedule with CCM PHARMD ?Bring all financials ?1st available is fine ?I may not be able to help with all---usually brand names only ? ?

## 2021-08-04 ENCOUNTER — Telehealth: Payer: Self-pay

## 2021-08-04 NOTE — Chronic Care Management (AMB) (Signed)
?  Care Management  ? ?Note ? ?08/04/2021 ?Name: Holly Price MRN: [id number] DOB: [birth year] ? ?Holly Price is a 71 y.o. year old female who is a primary care patient of Sharion Balloon, FNP and is actively engaged with the care management team. I reached out to Oren Beckmann by phone today to assist with scheduling a follow up visit with the Pharmacist ? ?Follow up plan: ?Telephone appointment with care management team member scheduled for:09/08/2021 ? ?Noreene Larsson, RMA ?Care Guide, Embedded Care Coordination ?[facility]  ?[place], [place] [address] ?Direct Dial: [phone] ?Museum/gallery [email or web address] ?Website: [email or web address]  ? ?

## 2021-08-18 ENCOUNTER — Other Ambulatory Visit: Payer: Self-pay | Admitting: Family

## 2021-08-18 ENCOUNTER — Telehealth: Payer: Self-pay | Admitting: Pharmacist

## 2021-08-18 DIAGNOSIS — R922 Inconclusive mammogram: Secondary | ICD-10-CM | POA: Diagnosis not present

## 2021-08-18 DIAGNOSIS — R921 Mammographic calcification found on diagnostic imaging of breast: Secondary | ICD-10-CM | POA: Diagnosis not present

## 2021-08-18 DIAGNOSIS — R928 Other abnormal and inconclusive findings on diagnostic imaging of breast: Secondary | ICD-10-CM

## 2021-08-18 NOTE — Telephone Encounter (Signed)
Victoza shipment arrived #5 boxes  ?Patient assistance supply ?Patient made aware ?

## 2021-08-22 ENCOUNTER — Other Ambulatory Visit: Payer: Self-pay | Admitting: Family

## 2021-08-22 DIAGNOSIS — M549 Dorsalgia, unspecified: Secondary | ICD-10-CM

## 2021-08-31 ENCOUNTER — Telehealth: Payer: Self-pay | Admitting: Family

## 2021-09-01 ENCOUNTER — Ambulatory Visit
Admission: RE | Admit: 2021-09-01 | Discharge: 2021-09-01 | Disposition: A | Discharge status: Home / Self Care | Payer: Medicare Other | Source: Ambulatory Visit | Attending: Family | Admitting: Family

## 2021-09-01 DIAGNOSIS — R928 Other abnormal and inconclusive findings on diagnostic imaging of breast: Secondary | ICD-10-CM

## 2021-09-01 DIAGNOSIS — R921 Mammographic calcification found on diagnostic imaging of breast: Secondary | ICD-10-CM | POA: Diagnosis not present

## 2021-09-01 DIAGNOSIS — D241 Benign neoplasm of right breast: Secondary | ICD-10-CM | POA: Diagnosis not present

## 2021-09-01 HISTORY — PX: BREAST BIOPSY: SHX20

## 2021-09-08 ENCOUNTER — Ambulatory Visit (INDEPENDENT_AMBULATORY_CARE_PROVIDER_SITE_OTHER): Payer: Medicare Other | Admitting: Pharmacist

## 2021-09-08 ENCOUNTER — Other Ambulatory Visit: Payer: Self-pay | Admitting: Family Medicine

## 2021-09-08 DIAGNOSIS — E785 Hyperlipidemia, unspecified: Secondary | ICD-10-CM

## 2021-09-08 DIAGNOSIS — G720 Drug-induced myopathy: Secondary | ICD-10-CM

## 2021-09-08 DIAGNOSIS — E119 Type 2 diabetes mellitus without complications: Secondary | ICD-10-CM

## 2021-09-08 NOTE — Progress Notes (Signed)
? ? ?Chronic Care Management ?Pharmacy Note ? ?09/08/2021 ?Name:  Holly Price MRN:  [id number] DOB:  11/16/1950 ? ?Summary: ? ?Diabetes: goal progressing-yes ?Controlled; current treatment: VICTOZA 1.73m daily;  ?Patient has been taking 0.663mdaily--increase to 1.9m51maily and will titrate to 1.8mg71m tolerated ?Denies personal and family history of Medullary thyroid cancer (MTC) ?Would like to transition to once weekly GLP1 if patient would like to transition at some point ?Enrolled in novo nordisk patient assistance program today ?Current glucose readings: fasting glucose: <130, post prandial glucose: <180 ?Denies hypoglycemic/hyperglycemic symptoms ?Discussed meal planning options and Plate method for healthy eating ?Avoid sugary drinks and desserts ?Incorporate balanced protein, non starchy veggies, 1 serving of carbohydrate with each meal ?Increase water intake ?Increase physical activity as able ?Current exercise: n/a ?Educated on medications-purpose and side effects; diet-heart healthy/plate method ?Recommended heart healthy/plate method--will help with hyperlipidemia as well ?Livalo called in to the drug store ?Enrolled in healthwell grant for patient assistance ? ?Assessed patient finances. Enrolled in novo nordisk patient assistance program for victoza ? ?Subjective: ?Holly RAGERan 70 y87. year old female who is a primary patient of HawkSharion BalloonP.  The CCM team was consulted for assistance with disease management and care coordination needs.   ? ?Engaged with patient face to face for follow up visit in response to provider referral for pharmacy case management and/or care coordination services.  ? ?Consent to Services:  ?The patient was given information about Chronic Care Management services, agreed to services, and gave verbal consent prior to initiation of services.  Please see initial visit note for detailed documentation.  ? ?Patient Care Team: ?HawkSharion BalloonP as PCP -  General (Nurse Practitioner) ?PruiLavera Gu[facility]H [facility] ? ?Objective: ? ?Lab Results  ?Component Value Date  ? CREATININE 1.03 (H) 09/14/2021  ? CREATININE 0.78 06/16/2021  ? CREATININE 0.76 03/14/2021  ? ? ?Lab Results  ?Component Value Date  ? HGBA1C 6.4 (H) 09/14/2021  ? ?Last diabetic Eye exam:  ?Lab Results  ?Component Value Date/Time  ? HMDIABEYEEXA No Retinopathy 08/30/2015 12:00 AM  ?  ?Last diabetic Foot exam: No results found for: HMDIABFOOTEX  ? ?   ?Component Value Date/Time  ? CHOL 166 04/01/2020 1000  ? CHOL 203 (H) 11/14/2012 0932  ? TRIG 122 04/01/2020 1000  ? TRIG 143 11/14/2012 0932  ? HDL 59 04/01/2020 1000  ? HDL 42 11/14/2012 0932  ? CHOLHDL 2.8 04/01/2020 1000  ? CHOLHDL 3.0 06/16/2018 1254  ? VLDL 36 06/16/2018 1254  ? LDLCSt. [place]11/09/2019 1000  ? LDLCM[place] (H) 11/14/2012 0932  ? ? ? ?  Latest Ref Rng & Units 09/14/2021  ? 12:10 PM 06/16/2021  ?  9:15 AM 03/14/2021  ? 11:02 AM  ?Hepatic Function  ?Total Protein 6.0 - 8.5 g/dL 6.7   6.7   6.9    ?Albumin 3.8 - 4.8 g/dL 4.4   4.6   4.5    ?AST 0 - 40 IU/L 24   19   15     ?ALT 0 - 32 IU/L 20   18   12     ?Alk Phosphatase 44 - 121 IU/L 41   42   44    ?Total Bilirubin 0.0 - 1.2 mg/dL 0.5   0.5   0.4    ? ? ?Lab Results  ?Component Value Date/Time  ? TSH 1.110 02/06/2019 09:12 AM  ? TSH 1.102 06/16/2018 12:55 PM  ?  TSH 0.910 06/16/2018 10:49 AM  ? ? ? ?  Latest Ref Rng & Units 06/16/2021  ?  9:15 AM 12/01/2020  ? 10:09 AM 04/01/2020  ? 10:00 AM  ?CBC  ?WBC 3.4 - 10.8 x10E3/uL 8.8   8.4   10.4    ?Hemoglobin 11.1 - 15.9 g/dL 15.6   16.2   15.6    ?Hematocrit 34.0 - 46.6 % 45.8   48.3   46.6    ?Platelets 150 - 450 x10E3/uL 232   256   259    ? ? ?Lab Results  ?Component Value Date/Time  ? VD25OH 95.5 02/06/2019 09:12 AM  ? VD25OH 83.7 06/16/2018 12:54 PM  ? ? ?Clinical ASCVD: No  ?The 10-year ASCVD risk score (Arnett DK, et al., 2019) is: 20.3% ?  Values used to calculate the score: ?    Age: 73 years ?    Sex: Female ?    Is  Non-Hispanic African American: No ?    Diabetic: Yes ?    Tobacco smoker: No ?    Systolic Blood Pressure: 401 mmHg ?    Is BP treated: Yes ?    HDL Cholesterol: 59 mg/dL ?    Total Cholesterol: 166 mg/dL   ? ?Other: (CHADS2VASc if Afib, PHQ9 if depression, MMRC or CAT for COPD, ACT, DEXA) ? ?Social History  ? ?Tobacco Use  ?Smoking Status Former  ? Packs/day: 0.50  ? Years: 10.00  ? Pack years: 5.00  ? Types: Cigarettes  ? Quit date: 06/22/2012  ? Years since quitting: 9.2  ?Smokeless Tobacco Never  ? ?BP Readings from Last 3 Encounters:  ?09/14/21 140/69  ?06/16/21 (!) 145/60  ?03/14/21 (!) 146/62  ? ?Pulse Readings from Last 3 Encounters:  ?09/14/21 67  ?06/16/21 (!) 58  ?03/14/21 97  ? ?Wt Readings from Last 3 Encounters:  ?09/14/21 179 lb 3.2 oz (81.3 kg)  ?06/16/21 187 lb (84.8 kg)  ?05/10/21 180 lb (81.6 kg)  ? ? ?Assessment: Review of patient past medical history, allergies, medications, health status, including review of consultants reports, laboratory and other test data, was performed as part of comprehensive evaluation and provision of chronic care management services.  ? ?SDOH:  (Social Determinants of Health) assessments and interventions performed:  ? ? ?CCM Care Plan ? ?No Known Allergies ? ?Medications Reviewed Today   ? ? Reviewed by Sharion Balloon, FNP (Family Nurse Practitioner) on 09/14/21 at 1155  Med List Status: <None>  ? ?Medication Order Taking? Sig Documenting Provider Last Dose Status Informant  ?ACCU-CHEK AVIVA PLUS test strip [id number] Yes TWICE DAILY Sharion Balloon, FNP Taking Active Self  ?acetaminophen (TYLENOL) 500 MG tablet [id number] Yes Take 1,000 mg by mouth every 6 (six) hours as needed for moderate pain or headache. [provider] Taking Active Self  ?amLODipine (NORVASC) 5 MG tablet [id number] Yes Take 1 tablet (5 mg total) by mouth daily. Sharion Balloon, FNP Taking Active   ?atenolol (TENORMIN) 50 MG tablet [id number] Yes TAKE ONE (1) TABLET BY MOUTH EVERY DAY  Hawks, Christy A, FNP Taking Active   ?baclofen (LIORESAL) 10 MG tablet [id number] Yes TAKE 1/2 TABLET BY MOUTH 3 TIMES DAILY Hawks, Theador Hawthorne, FNP Taking Active   ?Blood Glucose Monitoring Suppl (ACCU-CHEK AVIVA PLUS) W/DEVICE KIT [id number] Yes 1 Device by Does not apply route 2 (two) times daily. Test blood sugar twice daily.dx.250.0 Sharion Balloon, FNP Taking Active Self  ?Calcium Citrate-Vitamin D (CALCIUM + D PO) [id number] Yes Take  1 tablet by mouth daily. [provider] Taking Active Self  ?diclofenac Sodium (VOLTAREN) 1 % GEL [id number] Yes Apply 2 g topically 4 (four) times daily. Sharion Balloon, FNP Taking Active   ?EPINEPHrine 0.3 mg/0.3 mL IJ SOAJ injection [id number] Yes Inject 0.3 mLs (0.3 mg total) into the muscle as needed for anaphylaxis. Sharion Balloon, FNP Taking Active Self  ?liraglutide (VICTOZA) 18 MG/3ML SOPN [id number] Yes Inject 1.8 mg into the skin every evening. Sharion Balloon, FNP Taking Active   ?         ?Med Note (Maalik Pinn D   Fri Sep 08, 2021  8:20 AM) Via novo nordisk patient assistance program  ?lisinopril-hydrochlorothiazide (ZESTORETIC) 20-12.5 MG tablet [id number] Yes Take 1 tablet by mouth 2 (two) times daily. Sharion Balloon, FNP Taking Active   ?meloxicam (MOBIC) 15 MG tablet [id number] Yes TAKE ONE (1) TABLET BY MOUTH EVERY DAY Hawks, Theador Hawthorne, FNP Taking Active   ?NOVOTWIST 32G X 5 MM MISC [id number] Yes USE AS DIRECTED Sharion Balloon, FNP Taking Active Self  ?Pitavastatin Calcium (LIVALO) 2 MG TABS [id number] Yes Take 1 tablet (2 mg total) by mouth daily. Sharion Balloon, FNP Taking Active   ?RESTASIS 0.05 % ophthalmic emulsion [id number] Yes Place 1 drop into both eyes 2 (two) times daily as needed (dry eyes).  [provider] Taking Active Self  ?traMADol (ULTRAM) 50 MG tablet [id number] Yes Take 1 tablet (50 mg total) by mouth every 8 (eight) hours as needed. Sharion Balloon, FNP Taking Active   ?zinc gluconate 50 MG tablet [id number] Yes Take  50 mg by mouth daily. [provider] Taking Active Self  ? ?  ?  ? ?  ? ? ?Patient Active Problem List  ? Diagnosis Date Noted  ? Controlled substance agreement signed 06/16/2021  ? Positive colorec

## 2021-09-08 NOTE — Patient Instructions (Signed)
Visit Information ? ?Following are the goals we discussed today:  ?Current Barriers:  ?Unable to independently afford treatment regimen ?Suboptimal therapeutic regimen for T2DM ? ?Pharmacist Clinical Goal(s):  ?patient will verbalize ability to afford treatment regimen ?adhere to plan to optimize therapeutic regimen for T2DM as evidenced by report of adherence to recommended medication management changes through collaboration with PharmD and provider.  ? ? ?Interventions: ?1:1 collaboration with Sharion Balloon, FNP regarding development and update of comprehensive plan of care as evidenced by provider attestation and co-signature ?Inter-disciplinary care team collaboration (see longitudinal plan of care) ?Comprehensive medication review performed; medication list updated in electronic medical record ? ?Diabetes: goal progressing-yes ?Controlled; current treatment: VICTOZA 1.'8mg'$  daily;  ?Patient has been taking 0.'6mg'$  daily--increase to 1.'2mg'$  daily and will titrate to 1.'8mg'$  as tolerated ?Denies personal and family history of Medullary thyroid cancer (MTC) ?Would like to transition to once weekly GLP1 if patient would like to transition at some point ?Enrolled in novo nordisk patient assistance program today ?Current glucose readings: fasting glucose: <130, post prandial glucose: <180 ?Denies hypoglycemic/hyperglycemic symptoms ?Discussed meal planning options and Plate method for healthy eating ?Avoid sugary drinks and desserts ?Incorporate balanced protein, non starchy veggies, 1 serving of carbohydrate with each meal ?Increase water intake ?Increase physical activity as able ?Current exercise: n/a ?Educated on medications-purpose and side effects; diet-heart healthy/plate method ?Recommended heart healthy/plate method--will help with hyperlipidemia as well ?Livalo called in to the drug store ?Enrolled in healthwell grant for patient assistance ? ?Assessed patient finances. Enrolled in novo nordisk patient  assistance program for victoza ? ?Patient Goals/Self-Care Activities ?patient will:  ?- take medications as prescribed as evidenced by patient report and record review ?check glucose DAILY (FASTING), document, and provide at future appointments ?collaborate with provider on medication access solutions ?engage in dietary modifications by [place] ? ? ?Plan: Telephone follow up appointment with care management team member scheduled for:  3 months ? ?Signature ?Regina Eck, PharmD, BCPS ?Clinical Pharmacist, [facility] ?[place]  [facility] Phone [phone] ? ? ?Please call the care guide team at [phone] if you need to cancel or reschedule your appointment.  ? ?The patient verbalized understanding of instructions, educational materials, and care plan provided today and declined offer to receive copy of patient instructions, educational materials, and care plan.  ? ?

## 2021-09-14 ENCOUNTER — Ambulatory Visit (INDEPENDENT_AMBULATORY_CARE_PROVIDER_SITE_OTHER): Payer: Medicare Other | Admitting: Family

## 2021-09-14 ENCOUNTER — Encounter: Payer: Self-pay | Admitting: Family

## 2021-09-14 VITALS — BP 140/69 | HR 67 | Temp 97.4°F | Ht 63.0 in | Wt 179.2 lb

## 2021-09-14 DIAGNOSIS — E1159 Type 2 diabetes mellitus with other circulatory complications: Secondary | ICD-10-CM

## 2021-09-14 DIAGNOSIS — I152 Hypertension secondary to endocrine disorders: Secondary | ICD-10-CM

## 2021-09-14 DIAGNOSIS — E1169 Type 2 diabetes mellitus with other specified complication: Secondary | ICD-10-CM | POA: Diagnosis not present

## 2021-09-14 DIAGNOSIS — E669 Obesity, unspecified: Secondary | ICD-10-CM

## 2021-09-14 DIAGNOSIS — M25511 Pain in right shoulder: Secondary | ICD-10-CM

## 2021-09-14 DIAGNOSIS — E785 Hyperlipidemia, unspecified: Secondary | ICD-10-CM

## 2021-09-14 DIAGNOSIS — E0800 Diabetes mellitus due to underlying condition with hyperosmolarity without nonketotic hyperglycemic-hyperosmolar coma (NKHHC): Secondary | ICD-10-CM | POA: Diagnosis not present

## 2021-09-14 DIAGNOSIS — F0394 Unspecified dementia, unspecified severity, with anxiety: Secondary | ICD-10-CM

## 2021-09-14 DIAGNOSIS — E559 Vitamin D deficiency, unspecified: Secondary | ICD-10-CM | POA: Diagnosis not present

## 2021-09-14 DIAGNOSIS — Z86718 Personal history of other venous thrombosis and embolism: Secondary | ICD-10-CM

## 2021-09-14 DIAGNOSIS — M549 Dorsalgia, unspecified: Secondary | ICD-10-CM

## 2021-09-14 LAB — BAYER DCA HB A1C WAIVED: HB A1C (BAYER DCA - WAIVED): 6.4 % — ABNORMAL HIGH (ref 4.8–5.6)

## 2021-09-14 MED ORDER — TRAMADOL HCL 50 MG PO TABS: 50.0000 mg | ORAL_TABLET | Freq: Three times a day (TID) | ORAL | 0 refills | Status: DC | PRN

## 2021-09-14 MED ORDER — DONEPEZIL HCL 5 MG PO TABS: 5.0000 mg | ORAL_TABLET | Freq: Every day | ORAL | 1 refills | Status: DC

## 2021-09-14 MED ORDER — MEMANTINE HCL 5 MG PO TABS: 5.0000 mg | ORAL_TABLET | Freq: Two times a day (BID) | ORAL | 1 refills | Status: DC

## 2021-09-14 NOTE — Patient Instructions (Signed)

## 2021-09-14 NOTE — Progress Notes (Signed)
? ?Subjective:  ? ? Patient ID: Holly Price, female    DOB: Nov 08, 1950, 71 y.o.   MRN: [id number] ? ?Chief Complaint  ?Patient presents with  ? Medical Management of Chronic Issues  ? ?PT presents to the office today for chronic follow up. She has a hx of DVT and was taken off her xarelto in February 2020.  ?  ?Her memory is worsening. She saw our Clinical Pharmacists and was noted to have a lot of repeating and memory changes. However, the patient states she feels like it is stable.  ?Hypertension ?This is a chronic problem. The current episode started more than 1 year ago. The problem has been waxing and waning since onset. The problem is uncontrolled. Associated symptoms include malaise/fatigue. Pertinent negatives include no peripheral edema or shortness of breath. Risk factors for coronary artery disease include dyslipidemia, diabetes mellitus, obesity and sedentary lifestyle. The current treatment provides moderate improvement.  ?Hyperlipidemia ?This is a chronic problem. The current episode started more than 1 year ago. The problem is controlled. Exacerbating diseases include obesity. Pertinent negatives include no shortness of breath. Current antihyperlipidemic treatment includes statins. The current treatment provides moderate improvement of lipids. Risk factors for coronary artery disease include dyslipidemia, diabetes mellitus, a sedentary lifestyle and hypertension.  ?Diabetes ?She presents for her follow-up diabetic visit. She has type 2 diabetes mellitus. Pertinent negatives for hypoglycemia include no confusion. There are no hypoglycemic complications. Pertinent negatives for diabetic complications include no heart disease. Risk factors for coronary artery disease include dyslipidemia, diabetes mellitus, hypertension, sedentary lifestyle and post-menopausal. She is following a generally healthy diet. Her overall blood glucose range is 110-130 mg/dl. Eye exam is current.  ?Shoulder Pain  ?The pain  is present in the right shoulder. This is a chronic problem. The current episode started more than 1 year ago. There has been no history of extremity trauma. The problem has been waxing and waning. The quality of the pain is described as aching. The pain is at a severity of 7/10. The pain is moderate. She has tried acetaminophen, rest, OTC ointments and OTC pain meds for the symptoms. The treatment provided mild relief.  ? ? ? ?Review of Systems  ?Constitutional:  Positive for malaise/fatigue.  ?Respiratory:  Negative for shortness of breath.   ?Psychiatric/Behavioral:  Negative for confusion.   ?All other systems reviewed and are negative. ? ?   ?Objective:  ? Physical Exam ?Vitals reviewed.  ?Constitutional:   ?   General: She is not in acute distress. ?   Appearance: She is well-developed. She is obese.  ?HENT:  ?   Head: Normocephalic and atraumatic.  ?   Right Ear: Tympanic membrane normal.  ?   Left Ear: Tympanic membrane normal.  ?Eyes:  ?   Pupils: Pupils are equal, round, and reactive to light.  ?Neck:  ?   Thyroid: No thyromegaly.  ?Cardiovascular:  ?   Rate and Rhythm: Normal rate and regular rhythm.  ?   Heart sounds: Normal heart sounds. No murmur heard. ?Pulmonary:  ?   Effort: Pulmonary effort is normal. No respiratory distress.  ?   Breath sounds: Normal breath sounds. No wheezing.  ?Abdominal:  ?   General: Bowel sounds are normal. There is no distension.  ?   Palpations: Abdomen is soft.  ?   Tenderness: There is no abdominal tenderness.  ?Musculoskeletal:     ?   General: No tenderness. Normal range of motion.  ?   Cervical back:  Normal range of motion and neck supple.  ?Skin: ?   General: Skin is warm and dry.  ?Neurological:  ?   Mental Status: She is alert and oriented to person, place, and time.  ?   Cranial Nerves: No cranial nerve deficit.  ?   Deep Tendon Reflexes: Reflexes are normal and symmetric.  ?Psychiatric:     ?   Behavior: Behavior normal.     ?   Thought Content: Thought content  normal.     ?   Judgment: Judgment normal.  ? ? ? ? ?BP 140/69   Pulse 67   Temp (!) 97.4 ?F (36.3 ?C) (Temporal)   Ht 5' 3"  (1.6 m)   Wt 179 lb 3.2 oz (81.3 kg)   BMI 31.74 kg/m?  ? ?   ?Assessment & Plan:  ?YANELLI ZAPANTA comes in today with chief complaint of Medical Management of Chronic Issues ? ? ?Diagnosis and orders addressed: ? ?1. Hypertension associated with diabetes ([place]) ?- CMP14+EGFR ? ?2. Diabetes mellitus due to underlying condition with hyperosmolarity without coma, without long-term current use of insulin ([place]) ?- Bayer DCA Hb A1c Waived ?- CMP14+EGFR ? ?3. Hyperlipidemia associated with type 2 diabetes mellitus ([place]) ?- CMP14+EGFR ? ?4. Dementia with anxiety, unspecified dementia severity, unspecified dementia type ([place]) ?Start Aricept 5 mg and Namenda BID ?Memory strategies  ?- donepezil (ARICEPT) 5 MG tablet; Take 1 tablet (5 mg total) by mouth at bedtime.  Dispense: 90 tablet; Refill: 1 ?- memantine (NAMENDA) 5 MG tablet; Take 1 tablet (5 mg total) by mouth 2 (two) times daily.  Dispense: 180 tablet; Refill: 1 ?- CMP14+EGFR ? ?5. Vitamin D deficiency ?- CMP14+EGFR ? ?6. Pain in joint of right shoulder ?- CMP14+EGFR ?- traMADol (ULTRAM) 50 MG tablet; Take 1 tablet (50 mg total) by mouth every 8 (eight) hours as needed.  Dispense: 20 tablet; Refill: 0 ? ?7. Obesity (BMI 30-39.9) ?- CMP14+EGFR ? ?8. History of DVT (deep vein thrombosis) ?- CMP14+EGFR ? ?9. Mid back pain ?- traMADol (ULTRAM) 50 MG tablet; Take 1 tablet (50 mg total) by mouth every 8 (eight) hours as needed.  Dispense: 20 tablet; Refill: 0 ? ? ?Labs pending ?Health Maintenance reviewed ?Diet and exercise encouraged ? ?Follow up plan: ?3 months  ? ?Evelina Dun, FNP ? ? ?

## 2021-09-15 LAB — CMP14+EGFR
ALT: 20 IU/L (ref 0–32)
AST: 24 IU/L (ref 0–40)
Albumin/Globulin Ratio: 1.9 (ref 1.2–2.2)
Albumin: 4.4 g/dL (ref 3.8–4.8)
Alkaline Phosphatase: 41 IU/L — ABNORMAL LOW (ref 44–121)
BUN/Creatinine Ratio: 25 (ref 12–28)
BUN: 26 mg/dL (ref 8–27)
Bilirubin Total: 0.5 mg/dL (ref 0.0–1.2)
CO2: 25 mmol/L (ref 20–29)
Calcium: 9.8 mg/dL (ref 8.7–10.3)
Chloride: 102 mmol/L (ref 96–106)
Creatinine, Ser: 1.03 mg/dL — ABNORMAL HIGH (ref 0.57–1.00)
Globulin, Total: 2.3 g/dL (ref 1.5–4.5)
Glucose: 103 mg/dL — ABNORMAL HIGH (ref 70–99)
Potassium: 5 mmol/L (ref 3.5–5.2)
Sodium: 142 mmol/L (ref 134–144)
Total Protein: 6.7 g/dL (ref 6.0–8.5)
eGFR: 59 mL/min/{1.73_m2} — ABNORMAL LOW (ref >59)

## 2021-09-22 ENCOUNTER — Ambulatory Visit: Payer: Medicare Other | Admitting: Pharmacist

## 2021-09-22 DIAGNOSIS — I152 Hypertension secondary to endocrine disorders: Secondary | ICD-10-CM

## 2021-09-22 DIAGNOSIS — E0800 Diabetes mellitus due to underlying condition with hyperosmolarity without nonketotic hyperglycemic-hyperosmolar coma (NKHHC): Secondary | ICD-10-CM

## 2021-09-24 DIAGNOSIS — I152 Hypertension secondary to endocrine disorders: Secondary | ICD-10-CM

## 2021-09-24 DIAGNOSIS — E785 Hyperlipidemia, unspecified: Secondary | ICD-10-CM

## 2021-09-24 DIAGNOSIS — E0800 Diabetes mellitus due to underlying condition with hyperosmolarity without nonketotic hyperglycemic-hyperosmolar coma (NKHHC): Secondary | ICD-10-CM

## 2021-09-24 DIAGNOSIS — E1159 Type 2 diabetes mellitus with other circulatory complications: Secondary | ICD-10-CM

## 2021-09-24 DIAGNOSIS — E119 Type 2 diabetes mellitus without complications: Secondary | ICD-10-CM

## 2021-09-24 NOTE — Progress Notes (Signed)
? ? ?Chronic Care Management ?Pharmacy Note ? ?09/22/2021 ?Name:  Holly Price MRN:  [id number] DOB:  16-Nov-1950 ? ?Summary: ? ?Diabetes: goal progressing-ON track-->yes ?Controlled; current treatment: VICTOZA 1.56m daily;  ?Continue Victoza 1.220mdaily and will titrate to 1.39m539ms tolerated ?Patient was previously only taking 0.6mg6mily ?Denies personal and family history of Medullary thyroid cancer (MTC) ?Would like to transition to once weekly GLP1 if patient would like to transition at some point ?Enrolled in novo n[employer] patient assistance program April 2023 ?Current glucose readings: fasting glucose: <130, post prandial glucose: <180 ?Denies hypoglycemic/hyperglycemic symptoms ?Discussed meal planning options and Plate method for healthy eating ?Avoid sugary drinks and desserts ?Incorporate balanced protein, non starchy veggies, 1 serving of carbohydrate with each meal ?Increase water intake ?Increase physical activity as able ?Current exercise: n/a ?Educated on medications-purpose and side effects; diet-heart healthy/plate method ?Recommended heart healthy/plate method--will help with hyperlipidemia as well ?Livalo called in to the drug store ?Enrolled in healthwell grant for patient assistance ? ?Assessed patient finances. Enrolled in novo nordisk patient assistance program for victoza ?New onset dementia--patient was recently seen by PCP and started on Aricept & namenda ? ?Subjective: ?Holly Price who is a primary patient of HawkSharion BalloonP.  The CCM team was consulted for assistance with disease management and care coordination needs.   ? ?Engaged with patient by telephone for follow up visit in response to provider referral for pharmacy case management and/or care coordination services.  ? ?Consent to Services:  ?The patient was given information about Chronic Care Management services, agreed to services, and gave verbal consent prior to initiation of services.   Please see initial visit note for detailed documentation.  ? ?Patient Care Team: ?HawkSharion BalloonP as PCP - General (Nurse Practitioner) ?PruiLavera GuiseH [facility] (Family Medicine) ? ?Objective: ? ?Lab Results  ?Component Value Date  ? CREATININE 1.03 (H) 09/14/2021  ? CREATININE 0.78 06/16/2021  ? CREATININE 0.76 03/14/2021  ? ? ?Lab Results  ?Component Value Date  ? HGBA1C 6.4 (H) 09/14/2021  ? ?Last diabetic Eye exam:  ?Lab Results  ?Component Value Date/Time  ? HMDIABEYEEXA No Retinopathy 08/30/2015 12:00 AM  ?  ?Last diabetic Foot exam: No results found for: HMDIABFOOTEX  ? ?   ?Component Value Date/Time  ? CHOL 166 04/01/2020 1000  ? CHOL 203 (H) 11/14/2012 0932  ? TRIG 122 04/01/2020 1000  ? TRIG 143 11/14/2012 0932  ? HDL 59 04/01/2020 1000  ? HDL 42 11/14/2012 0932  ? CHOLHDL 2.8 04/01/2020 1000  ? CHOLHDL 3.0 06/16/2018 1254  ? VLDL 36 06/16/2018 1254  ? LDLCBrown11/09/2019 1000  ? LDLCMount [place] (H) 11/14/2012 0932  ? ? ? ?  Latest Ref Rng & Units 09/14/2021  ? 12:10 PM 06/16/2021  ?  9:15 AM 03/14/2021  ? 11:02 AM  ?Hepatic Function  ?Total Protein 6.0 - 8.5 g/dL 6.7   6.7   6.9    ?Albumin 3.8 - 4.8 g/dL 4.4   4.6   4.5    ?AST 0 - 40 IU/L 24   19   15     ?ALT 0 - 32 IU/L 20   18   12     ?Alk Phosphatase 44 - 121 IU/L 41   42   44    ?Total Bilirubin 0.0 - 1.2 mg/dL 0.5   0.5   0.4    ? ? ?Lab Results  ?  Component Value Date/Time  ? TSH 1.110 02/06/2019 09:12 AM  ? TSH 1.102 06/16/2018 12:55 PM  ? TSH 0.910 06/16/2018 10:49 AM  ? ? ? ?  Latest Ref Rng & Units 06/16/2021  ?  9:15 AM 12/01/2020  ? 10:09 AM 04/01/2020  ? 10:00 AM  ?CBC  ?WBC 3.4 - 10.8 x10E3/uL 8.8   8.4   10.4    ?Hemoglobin 11.1 - 15.9 g/dL 15.6   16.2   15.6    ?Hematocrit 34.0 - 46.6 % 45.8   48.3   46.6    ?Platelets 150 - 450 x10E3/uL 232   256   259    ? ? ?Lab Results  ?Component Value Date/Time  ? VD25OH 95.5 02/06/2019 09:12 AM  ? VD25OH 83.7 06/16/2018 12:54 PM  ? ? ?Clinical ASCVD: No  ?The 10-year ASCVD risk score  (Arnett DK, et al., 2019) is: 20.3% ?  Values used to calculate the score: ?    Age: 89 years ?    Sex: Price ?    Is Non-Hispanic African American: No ?    Diabetic: Yes ?    Tobacco smoker: No ?    Systolic Blood Pressure: 932 mmHg ?    Is BP treated: Yes ?    HDL Cholesterol: 59 mg/dL ?    Total Cholesterol: 166 mg/dL   ? ?Other: (CHADS2VASc if Afib, PHQ9 if depression, MMRC or CAT for COPD, ACT, DEXA) ? ?Social History  ? ?Tobacco Use  ?Smoking Status Former  ? Packs/day: 0.50  ? Years: 10.00  ? Pack years: 5.00  ? Types: Cigarettes  ? Quit date: 06/22/2012  ? Years since quitting: 9.2  ?Smokeless Tobacco Never  ? ?BP Readings from Last 3 Encounters:  ?09/14/21 140/69  ?06/16/21 (!) 145/60  ?03/14/21 (!) 146/62  ? ?Pulse Readings from Last 3 Encounters:  ?09/14/21 67  ?06/16/21 (!) 58  ?03/14/21 97  ? ?Wt Readings from Last 3 Encounters:  ?09/14/21 179 lb 3.2 oz (81.3 kg)  ?06/16/21 187 lb (84.8 kg)  ?05/10/21 180 lb (81.6 kg)  ? ? ?Assessment: Review of patient past medical history, allergies, medications, health status, including review of consultants reports, laboratory and other test data, was performed as part of comprehensive evaluation and provision of chronic care management services.  ? ?SDOH:  (Social Determinants of Health) assessments and interventions performed:  ? ? ?CCM Care Plan ? ?No Known Allergies ? ?Medications Reviewed Today   ? ? Reviewed by Lavera Guise, [facility] (Pharmacist) on 09/24/21 at [place] List Status: <None>  ? ?Medication Order Taking? Sig Documenting Provider Last Dose Status Informant  ?ACCU-CHEK AVIVA PLUS test strip [id number] No TWICE DAILY Sharion Balloon, FNP Taking Active Self  ?acetaminophen (TYLENOL) 500 MG tablet [id number] No Take 1,000 mg by mouth every 6 (six) hours as needed for moderate pain or headache. [provider] Taking Active Self  ?amLODipine (NORVASC) 5 MG tablet [id number] No Take 1 tablet (5 mg total) by mouth daily. Sharion Balloon, FNP  Taking Active   ?atenolol (TENORMIN) 50 MG tablet [id number] No TAKE ONE (1) TABLET BY MOUTH EVERY DAY Hawks, Christy A, FNP Taking Active   ?baclofen (LIORESAL) 10 MG tablet [id number] No TAKE 1/2 TABLET BY MOUTH 3 TIMES DAILY Hawks, Theador Hawthorne, FNP Taking Active   ?Blood Glucose Monitoring Suppl (ACCU-CHEK AVIVA PLUS) W/DEVICE KIT [id number] No 1 Device by Does not apply route 2 (two) times daily. Test blood sugar twice daily.dx.250.0 Hawks,  Theador Hawthorne, FNP Taking Active Self  ?Calcium Citrate-Vitamin D (CALCIUM + D PO) [id number] No Take 1 tablet by mouth daily. [provider] Taking Active Self  ?diclofenac Sodium (VOLTAREN) 1 % GEL [id number] No Apply 2 g topically 4 (four) times daily. Sharion Balloon, FNP Taking Active   ?donepezil (ARICEPT) 5 MG tablet [id number]  Take 1 tablet (5 mg total) by mouth at bedtime. Sharion Balloon, FNP  Active   ?EPINEPHrine 0.3 mg/0.3 mL IJ SOAJ injection [id number] No Inject 0.3 mLs (0.3 mg total) into the muscle as needed for anaphylaxis. Sharion Balloon, FNP Taking Active Self  ?liraglutide (VICTOZA) 18 MG/3ML SOPN [id number] No Inject 1.8 mg into the skin every evening. Sharion Balloon, FNP Taking Active   ?         ?Med Note (Kendarius Vigen D   Fri Sep 08, 2021  8:20 AM) Via novo nordisk patient assistance program  ?lisinopril-hydrochlorothiazide (ZESTORETIC) 20-12.5 MG tablet [id number] No Take 1 tablet by mouth 2 (two) times daily. Sharion Balloon, FNP Taking Active   ?meloxicam (MOBIC) 15 MG tablet [id number] No TAKE ONE (1) TABLET BY MOUTH EVERY DAY Hawks, Christy A, FNP Taking Active   ?memantine (NAMENDA) 5 MG tablet [id number]  Take 1 tablet (5 mg total) by mouth 2 (two) times daily. Sharion Balloon, FNP  Active   ?NOVOTWIST 32G X 5 MM MISC [id number] No USE AS DIRECTED Sharion Balloon, FNP Taking Active Self  ?Pitavastatin Calcium (LIVALO) 2 MG TABS [id number] No Take 1 tablet (2 mg total) by mouth daily. Sharion Balloon, FNP Taking Active   ?RESTASIS 0.05 %  ophthalmic emulsion [id number] No Place 1 drop into both eyes 2 (two) times daily as needed (dry eyes).  [provider] Taking Active Self  ?traMADol (ULTRAM) 50 MG tablet [id number]  Take 1 tablet (50 mg t

## 2021-09-24 NOTE — Patient Instructions (Addendum)
Visit Information ? ?Following are the goals we discussed today:  ?Current Barriers:  ?Unable to independently afford treatment regimen ?Suboptimal therapeutic regimen for T2DM ? ?Pharmacist Clinical Goal(s):  ?patient will verbalize ability to afford treatment regimen ?adhere to plan to optimize therapeutic regimen for T2DM as evidenced by report of adherence to recommended medication management changes through collaboration with PharmD and provider.  ? ?Interventions: ?1:1 collaboration with Holly Balloon, FNP regarding development and update of comprehensive plan of care as evidenced by provider attestation and co-signature ?Inter-disciplinary care team collaboration (see longitudinal plan of care) ?Comprehensive medication review performed; medication list updated in electronic medical record ? ?Diabetes: goal progressing-ON track-->yes ?Controlled; current treatment: VICTOZA 1.'8mg'$  daily;  ?Continue Victoza 1.'2mg'$  daily and will titrate to 1.'8mg'$  as tolerated ?Patient was previously only taking 0.'6mg'$  daily ?Denies personal and family history of Medullary thyroid cancer (MTC) ?Would like to transition to once weekly GLP1 if patient would like to transition at some point ?Enrolled in novo nordisk patient assistance program April 2023 ?Current glucose readings: fasting glucose: <130, post prandial glucose: <180 ?Denies hypoglycemic/hyperglycemic symptoms ?Discussed meal planning options and Plate method for healthy eating ?Avoid sugary drinks and desserts ?Incorporate balanced protein, non starchy veggies, 1 serving of carbohydrate with each meal ?Increase water intake ?Increase physical activity as able ?Current exercise: n/a ?Educated on medications-purpose and side effects; diet-heart healthy/plate method ?Recommended heart healthy/plate method--will help with hyperlipidemia as well ?Livalo called in to the drug store ?Enrolled in healthwell grant for patient assistance ? ?Assessed patient finances. Enrolled in  novo nordisk patient assistance program for victoza ?New onset dementia--patient was recently seen by PCP and started on Aricept & namenda ? ?Patient Goals/Self-Care Activities ?patient will:  ?- take medications as prescribed as evidenced by patient report and record review ?check glucose DAILY (FASTING), document, and provide at future appointments ?collaborate with provider on medication access solutions ?engage in dietary modifications by Holly Price ? ? ?Plan: Telephone follow up appointment with care management team member scheduled for:  1 MONTH ? ?Signature ?Holly Price, PharmD, BCPS ?Clinical Pharmacist, [facility] ?[place]  II Phone [phone] ? ? ?Please call the care guide team at [phone] if you need to cancel or reschedule your appointment.  ? ?The patient verbalized understanding of instructions, educational materials, and care plan provided today and declined offer to receive copy of patient instructions, educational materials, and care plan.  ? ?

## 2021-09-29 ENCOUNTER — Ambulatory Visit (INDEPENDENT_AMBULATORY_CARE_PROVIDER_SITE_OTHER): Payer: Medicare Other | Admitting: Pharmacist

## 2021-09-29 DIAGNOSIS — G720 Drug-induced myopathy: Secondary | ICD-10-CM

## 2021-09-29 DIAGNOSIS — E1169 Type 2 diabetes mellitus with other specified complication: Secondary | ICD-10-CM

## 2021-09-29 DIAGNOSIS — E0800 Diabetes mellitus due to underlying condition with hyperosmolarity without nonketotic hyperglycemic-hyperosmolar coma (NKHHC): Secondary | ICD-10-CM

## 2021-09-29 NOTE — Patient Instructions (Signed)
Visit Information ? ?Following are the goals we discussed today:  ?Current Barriers:  ?Unable to independently afford treatment regimen ?Suboptimal therapeutic regimen for T2DM ? ?Pharmacist Clinical Goal(s):  ?patient will verbalize ability to afford treatment regimen ?adhere to plan to optimize therapeutic regimen for T2DM as evidenced by report of adherence to recommended medication management changes through collaboration with PharmD and provider.  ? ?Interventions: ?1:1 collaboration with Sharion Balloon, FNP regarding development and update of comprehensive plan of care as evidenced by provider attestation and co-signature ?Inter-disciplinary care team collaboration (see longitudinal plan of care) ?Comprehensive medication review performed; medication list updated in electronic medical record ? ?Diabetes: goal progressing-ON track-->yes ?Controlled; current treatment: VICTOZA 1.'8mg'$  daily;  ?Continue Victoza 1.'2mg'$  daily and will titrate to 1.'8mg'$  as tolerated ?Patient was previously only taking 0.'6mg'$  daily ?Tolerating well-->will continue victoza 1.'2mg'$  daily ?Denies personal and family history of Medullary thyroid cancer (MTC) ?Would like to transition to once weekly GLP1 if patient would like to transition at some point ?Enrolled in novo nordisk patient assistance program April 2023 ?Current glucose readings: fasting glucose: <130, post prandial glucose: <180 ?New traditional glucometer called in to [employer] supply for $0 copay (via parachute portal) ?Patient has not heard back from company--will call with patient on the line ?Denies hypoglycemic/hyperglycemic symptoms ?Discussed meal planning options and Plate method for healthy eating ?Avoid sugary drinks and desserts ?Incorporate balanced protein, non starchy veggies, 1 serving of carbohydrate with each meal ?Increase water intake ?Increase physical activity as able ?Current exercise: n/a ?Educated on medications-purpose and side effects;  diet-heart healthy/plate method ?Recommended heart healthy/plate method--will help with hyperlipidemia as well ?Livalo called in to the drug store ?Enrolled in healthwell grant for patient assistance ? ?Assessed patient finances. Enrolled in novo nordisk patient assistance program for victoza ?New onset dementia--patient was recently seen by PCP and started on Aricept & namenda ? ?Patient Goals/Self-Care Activities ?patient will:  ?- take medications as prescribed as evidenced by patient report and record review ?check glucose DAILY (FASTING), document, and provide at future appointments ?collaborate with provider on medication access solutions ?engage in dietary modifications by [place] ? ? ?Plan: Telephone follow up appointment with care management team member scheduled for:  3 months ? ?Signature ?Regina Eck, PharmD, BCPS ?Clinical Pharmacist, [facility] ?[place]  II Phone [phone] ? ? ?Please call the care guide team at [phone] if you need to cancel or reschedule your appointment.  ? ?The patient verbalized understanding of instructions, educational materials, and care plan provided today and declined offer to receive copy of patient instructions, educational materials, and care plan.  ? ?

## 2021-09-29 NOTE — Progress Notes (Signed)
? ? ?Chronic Care Management ?Pharmacy Note ? ?09/29/2021 ?Name:  Holly Price MRN:  [id number] DOB:  06-05-1950 ? ?Summary: ? ?Diabetes: goal progressing-ON track-->yes ?Controlled; current treatment: VICTOZA 1.141m daily;  ?Continue Victoza 1.245mdaily and will titrate to 1.41m47ms tolerated ?Patient was previously only taking 0.6mg82mily ?Tolerating well-->will continue victoza 1.2mg 36mly ?Denies personal and family history of Medullary thyroid cancer (MTC) ?Would like to transition to once weekly GLP1 if patient would like to transition at some point ?Enrolled in novo nordisk patient assistance program April 2023 ?Current glucose readings: fasting glucose: <130, post prandial glucose: <180 ?New traditional glucometer called in to [employer] for $0 copay (via parachute portal) ?Patient has not heard back from company--will call with patient on the line ?Denies hypoglycemic/hyperglycemic symptoms ?Discussed meal planning options and Plate method for healthy eating ?Avoid sugary drinks and desserts ?Incorporate balanced protein, non starchy veggies, 1 serving of carbohydrate with each meal ?Increase water intake ?Increase physical activity as able ?Current exercise: n/a ?Educated on medications-purpose and side effects; diet-heart healthy/plate method ? ?Subjective: ?Holly Price 71 y.71 year old female who is a primary patient of HawksSharion Balloon.  The CCM team was consulted for assistance with disease management and care coordination needs.   ? ?Engaged with patient by telephone for follow up visit in response to provider referral for pharmacy case management and/or care coordination services.  ? ?Consent to Services:  ?The patient was given information about Chronic Care Management services, agreed to services, and gave verbal consent prior to initiation of services.  Please see initial visit note for detailed documentation.  ? ?Patient Care Team: ?HawksSharion Balloon as PCP -  General (Nurse Practitioner) ?PruitLavera Guise [facility] (Family Medicine) ? ?Objective: ? ?Lab Results  ?Component Value Date  ? CREATININE 1.03 (H) 09/14/2021  ? CREATININE 0.78 06/16/2021  ? CREATININE 0.76 03/14/2021  ? ? ?Lab Results  ?Component Value Date  ? HGBA1C 6.4 (H) 09/14/2021  ? ?Last diabetic Eye exam:  ?Lab Results  ?Component Value Date/Time  ? HMDIABEYEEXA No Retinopathy 08/30/2015 12:00 AM  ?  ?Last diabetic Foot exam: No results found for: HMDIABFOOTEX  ? ?   ?Component Value Date/Time  ? CHOL 166 04/01/2020 1000  ? CHOL 203 (H) 11/14/2012 0932  ? TRIG 122 04/01/2020 1000  ? TRIG 143 11/14/2012 0932  ? HDL 59 04/01/2020 1000  ? HDL 42 11/14/2012 0932  ? CHOLHDL 2.8 04/01/2020 1000  ? CHOLHDL 3.0 06/16/2018 1254  ? VLDL 36 06/16/2018 1254  ? LDLCABridgeton1/09/2019 1000  ? LDLCANew [place](H) 11/14/2012 0932  ? ? ? ?  Latest Ref Rng & Units 09/14/2021  ? 12:10 PM 06/16/2021  ?  9:15 AM 03/14/2021  ? 11:02 AM  ?Hepatic Function  ?Total Protein 6.0 - 8.5 g/dL 6.7   6.7   6.9    ?Albumin 3.8 - 4.8 g/dL 4.4   4.6   4.5    ?AST 0 - 40 IU/L _0 ?ALT 0 - 32 IU/L _1 ?Alk Phosphatase 44 - 121 IU/L 41   42   44    ?Total Bilirubin 0.0 - 1.2 mg/dL 0.5   0.5   0.4    ? ? ?Lab Results  ?Component Value Date/Time  ? TSH 1.110 02/06/2019 09:12 AM  ? TSH 1.102 06/16/2018 12:55  PM  ? TSH 0.910 06/16/2018 10:49 AM  ? ? ? ?  Latest Ref Rng & Units 06/16/2021  ?  9:15 AM 12/01/2020  ? 10:09 AM 04/01/2020  ? 10:00 AM  ?CBC  ?WBC 3.4 - 10.8 x10E3/uL 8.8   8.4   10.4    ?Hemoglobin 11.1 - 15.9 g/dL 15.6   16.2   15.6    ?Hematocrit 34.0 - 46.6 % 45.8   48.3   46.6    ?Platelets 150 - 450 x10E3/uL 232   256   259    ? ? ?Lab Results  ?Component Value Date/Time  ? VD25OH 95.5 02/06/2019 09:12 AM  ? VD25OH 83.7 06/16/2018 12:54 PM  ? ? ?Clinical ASCVD: No  ?The 10-year ASCVD risk score (Arnett DK, et al., 2019) is: 20.3% ?  Values used to calculate the score: ?    Age: 71 years ?    Sex: Female ?    Is  Non-Hispanic African American: No ?    Diabetic: Yes ?    Tobacco smoker: No ?    Systolic Blood Pressure: 220 mmHg ?    Is BP treated: Yes ?    HDL Cholesterol: 59 mg/dL ?    Total Cholesterol: 166 mg/dL   ? ?Other: (CHADS2VASc if Afib, PHQ9 if depression, MMRC or CAT for COPD, ACT, DEXA) ? ?Social History  ? ?Tobacco Use  ?Smoking Status Former  ? Packs/day: 0.50  ? Years: 10.00  ? Pack years: 5.00  ? Types: Cigarettes  ? Quit date: 06/22/2012  ? Years since quitting: 9.2  ?Smokeless Tobacco Never  ? ?BP Readings from Last 3 Encounters:  ?09/14/21 140/69  ?06/16/21 (!) 145/60  ?03/14/21 (!) 146/62  ? ?Pulse Readings from Last 3 Encounters:  ?09/14/21 67  ?06/16/21 (!) 58  ?03/14/21 97  ? ?Wt Readings from Last 3 Encounters:  ?09/14/21 179 lb 3.2 oz (81.3 kg)  ?06/16/21 187 lb (84.8 kg)  ?05/10/21 180 lb (81.6 kg)  ? ? ?Assessment: Review of patient past medical history, allergies, medications, health status, including review of consultants reports, laboratory and other test data, was performed as part of comprehensive evaluation and provision of chronic care management services.  ? ?SDOH:  (Social Determinants of Health) assessments and interventions performed:  ? ? ?CCM Care Plan ? ?No Known Allergies ? ?Medications Reviewed Today   ? ? Reviewed by Lavera Guise, [facility] (Pharmacist) on 09/29/21 at 71  Med List Status: <None>  ? ?Medication Order Taking? Sig Documenting Provider Last Dose Status Informant  ?ACCU-CHEK AVIVA PLUS test strip [id number] No TWICE DAILY Sharion Balloon, FNP Taking Active Self  ?acetaminophen (TYLENOL) 500 MG tablet [id number] No Take 1,000 mg by mouth every 6 (six) hours as needed for moderate pain or headache. [provider] Taking Active Self  ?amLODipine (NORVASC) 5 MG tablet [id number] No Take 1 tablet (5 mg total) by mouth daily. Sharion Balloon, FNP Taking Active   ?atenolol (TENORMIN) 50 MG tablet [id number] No TAKE ONE (1) TABLET BY MOUTH EVERY DAY Hawks, Christy A, FNP  Taking Active   ?baclofen (LIORESAL) 10 MG tablet [id number] No TAKE 1/2 TABLET BY MOUTH 3 TIMES DAILY Hawks, Theador Hawthorne, FNP Taking Active   ?Blood Glucose Monitoring Suppl (ACCU-CHEK AVIVA PLUS) W/DEVICE KIT [id number] No 1 Device by Does not apply route 2 (two) times daily. Test blood sugar twice daily.dx.250.0 Sharion Balloon, FNP Taking Active Self  ?Calcium Citrate-Vitamin D (CALCIUM + D PO) [id number] No  Take 1 tablet by mouth daily. [provider] Taking Active Self  ?diclofenac Sodium (VOLTAREN) 1 % GEL [id number] No Apply 2 g topically 4 (four) times daily. Sharion Balloon, FNP Taking Active   ?donepezil (ARICEPT) 5 MG tablet [id number]  Take 1 tablet (5 mg total) by mouth at bedtime. Sharion Balloon, FNP  Active   ?EPINEPHrine 0.3 mg/0.3 mL IJ SOAJ injection [id number] No Inject 0.3 mLs (0.3 mg total) into the muscle as needed for anaphylaxis. Sharion Balloon, FNP Taking Active Self  ?liraglutide (VICTOZA) 18 MG/3ML SOPN [id number] No Inject 1.8 mg into the skin every evening.  ?Patient taking differently: Inject 1.2 mg into the skin every evening.  ? Sharion Balloon, FNP Taking Active   ?         ?Med Note (Samael Blades D   Fri Sep 08, 2021  8:20 AM) Via novo nordisk patient assistance program  ?lisinopril-hydrochlorothiazide (ZESTORETIC) 20-12.5 MG tablet [id number] No Take 1 tablet by mouth 2 (two) times daily. Sharion Balloon, FNP Taking Active   ?meloxicam (MOBIC) 15 MG tablet [id number] No TAKE ONE (1) TABLET BY MOUTH EVERY DAY Hawks, Christy A, FNP Taking Active   ?memantine (NAMENDA) 5 MG tablet [id number]  Take 1 tablet (5 mg total) by mouth 2 (two) times daily. Sharion Balloon, FNP  Active   ?NOVOTWIST 32G X 5 MM MISC [id number] No USE AS DIRECTED Sharion Balloon, FNP Taking Active Self  ?Pitavastatin Calcium (LIVALO) 2 MG TABS [id number] No Take 1 tablet (2 mg total) by mouth daily. Sharion Balloon, FNP Taking Active   ?RESTASIS 0.05 % ophthalmic emulsion [id number] No Place 1 drop into  both eyes 2 (two) times daily as needed (dry eyes).  [provider] Taking Active Self  ?traMADol (ULTRAM) 50 MG tablet [id number]  Take 1 tablet (50 mg total) by mouth every 8 (eight) hours as ne

## 2021-10-21 ENCOUNTER — Other Ambulatory Visit: Payer: Self-pay | Admitting: Family

## 2021-10-21 DIAGNOSIS — M549 Dorsalgia, unspecified: Secondary | ICD-10-CM

## 2021-10-21 DIAGNOSIS — M25511 Pain in right shoulder: Secondary | ICD-10-CM

## 2021-10-21 DIAGNOSIS — E1159 Type 2 diabetes mellitus with other circulatory complications: Secondary | ICD-10-CM

## 2021-10-25 ENCOUNTER — Telehealth: Payer: Medicare Other

## 2021-10-25 DIAGNOSIS — E0800 Diabetes mellitus due to underlying condition with hyperosmolarity without nonketotic hyperglycemic-hyperosmolar coma (NKHHC): Secondary | ICD-10-CM

## 2021-10-25 DIAGNOSIS — E785 Hyperlipidemia, unspecified: Secondary | ICD-10-CM

## 2021-10-25 DIAGNOSIS — E119 Type 2 diabetes mellitus without complications: Secondary | ICD-10-CM

## 2021-10-25 DIAGNOSIS — E1169 Type 2 diabetes mellitus with other specified complication: Secondary | ICD-10-CM

## 2021-11-14 ENCOUNTER — Other Ambulatory Visit: Payer: Self-pay | Admitting: Family

## 2021-11-14 DIAGNOSIS — M549 Dorsalgia, unspecified: Secondary | ICD-10-CM

## 2021-11-14 DIAGNOSIS — M25511 Pain in right shoulder: Secondary | ICD-10-CM

## 2021-11-24 ENCOUNTER — Other Ambulatory Visit: Payer: Self-pay | Admitting: Family

## 2021-11-24 DIAGNOSIS — M549 Dorsalgia, unspecified: Secondary | ICD-10-CM

## 2021-11-30 ENCOUNTER — Ambulatory Visit (INDEPENDENT_AMBULATORY_CARE_PROVIDER_SITE_OTHER): Payer: Medicare Other

## 2021-11-30 ENCOUNTER — Ambulatory Visit (INDEPENDENT_AMBULATORY_CARE_PROVIDER_SITE_OTHER): Payer: Medicare Other | Admitting: Family

## 2021-11-30 ENCOUNTER — Encounter: Payer: Self-pay | Admitting: Family

## 2021-11-30 VITALS — BP 130/56 | HR 67 | Temp 97.6°F | Ht 63.0 in | Wt 175.0 lb

## 2021-11-30 DIAGNOSIS — E785 Hyperlipidemia, unspecified: Secondary | ICD-10-CM

## 2021-11-30 DIAGNOSIS — I152 Hypertension secondary to endocrine disorders: Secondary | ICD-10-CM

## 2021-11-30 DIAGNOSIS — R220 Localized swelling, mass and lump, head: Secondary | ICD-10-CM

## 2021-11-30 DIAGNOSIS — M542 Cervicalgia: Secondary | ICD-10-CM

## 2021-11-30 DIAGNOSIS — M961 Postlaminectomy syndrome, not elsewhere classified: Secondary | ICD-10-CM

## 2021-11-30 DIAGNOSIS — G8929 Other chronic pain: Secondary | ICD-10-CM | POA: Diagnosis not present

## 2021-11-30 DIAGNOSIS — M5412 Radiculopathy, cervical region: Secondary | ICD-10-CM

## 2021-11-30 DIAGNOSIS — E1142 Type 2 diabetes mellitus with diabetic polyneuropathy: Secondary | ICD-10-CM

## 2021-11-30 DIAGNOSIS — E1159 Type 2 diabetes mellitus with other circulatory complications: Secondary | ICD-10-CM

## 2021-11-30 DIAGNOSIS — T7840XA Allergy, unspecified, initial encounter: Secondary | ICD-10-CM

## 2021-11-30 DIAGNOSIS — M546 Pain in thoracic spine: Secondary | ICD-10-CM | POA: Diagnosis not present

## 2021-11-30 DIAGNOSIS — E0800 Diabetes mellitus due to underlying condition with hyperosmolarity without nonketotic hyperglycemic-hyperosmolar coma (NKHHC): Secondary | ICD-10-CM

## 2021-11-30 DIAGNOSIS — Z79899 Other long term (current) drug therapy: Secondary | ICD-10-CM

## 2021-11-30 DIAGNOSIS — M549 Dorsalgia, unspecified: Secondary | ICD-10-CM

## 2021-11-30 DIAGNOSIS — Z86718 Personal history of other venous thrombosis and embolism: Secondary | ICD-10-CM

## 2021-11-30 DIAGNOSIS — M25511 Pain in right shoulder: Secondary | ICD-10-CM

## 2021-11-30 DIAGNOSIS — E559 Vitamin D deficiency, unspecified: Secondary | ICD-10-CM

## 2021-11-30 DIAGNOSIS — E1169 Type 2 diabetes mellitus with other specified complication: Secondary | ICD-10-CM

## 2021-11-30 DIAGNOSIS — M47814 Spondylosis without myelopathy or radiculopathy, thoracic region: Secondary | ICD-10-CM | POA: Diagnosis not present

## 2021-11-30 DIAGNOSIS — F0394 Unspecified dementia, unspecified severity, with anxiety: Secondary | ICD-10-CM

## 2021-11-30 MED ORDER — LIVALO 2 MG PO TABS: 2.0000 mg | ORAL_TABLET | Freq: Every day | ORAL | 1 refills | Status: DC

## 2021-11-30 MED ORDER — TRAMADOL HCL 50 MG PO TABS: 50.0000 mg | ORAL_TABLET | Freq: Three times a day (TID) | ORAL | 2 refills | Status: DC | PRN

## 2021-11-30 MED ORDER — VICTOZA 18 MG/3ML ~~LOC~~ SOPN: 1.8000 mg | PEN_INJECTOR | Freq: Every evening | SUBCUTANEOUS | 2 refills | Status: DC

## 2021-11-30 MED ORDER — BACLOFEN 10 MG PO TABS: ORAL_TABLET | ORAL | 2 refills | Status: DC

## 2021-11-30 MED ORDER — MELOXICAM 15 MG PO TABS: ORAL_TABLET | ORAL | 0 refills | Status: DC

## 2021-11-30 MED ORDER — AMLODIPINE BESYLATE 5 MG PO TABS: 5.0000 mg | ORAL_TABLET | Freq: Every day | ORAL | 1 refills | Status: DC

## 2021-11-30 MED ORDER — DICLOFENAC SODIUM 1 % EX GEL: 2.0000 g | Freq: Four times a day (QID) | CUTANEOUS | 1 refills | Status: DC

## 2021-11-30 MED ORDER — LISINOPRIL-HYDROCHLOROTHIAZIDE 20-12.5 MG PO TABS: 1.0000 | ORAL_TABLET | Freq: Every day | ORAL | 1 refills | Status: DC

## 2021-11-30 NOTE — Progress Notes (Signed)
Subjective:    Patient ID: Holly Price, female    DOB: Jan 23, 1951, 71 y.o.   MRN: [id number]  Chief Complaint  Patient presents with   Medical Management of Chronic Issues   Hyperlipidemia   Hypertension   PT presents to the office today for chronic follow up. She has a hx of DVT and was taken off her xarelto in February 2020.    She reports her memory is stable, but admits to being forgetful at times.  Hyperlipidemia This is a chronic problem. The current episode started more than 1 year ago. Exacerbating diseases include obesity. Pertinent negatives include no shortness of breath. Current antihyperlipidemic treatment includes statins. The current treatment provides moderate improvement of lipids. Risk factors for coronary artery disease include dyslipidemia, diabetes mellitus, hypertension, a sedentary lifestyle and post-menopausal.  Hypertension This is a chronic problem. The current episode started more than 1 year ago. The problem has been resolved since onset. The problem is controlled. Pertinent negatives include no blurred vision, malaise/fatigue, peripheral edema or shortness of breath. Risk factors for coronary artery disease include dyslipidemia, obesity and sedentary lifestyle. The current treatment provides moderate improvement.  Diabetes She presents for her follow-up diabetic visit. She has type 2 diabetes mellitus. Associated symptoms include foot paresthesias. Pertinent negatives for diabetes include no blurred vision and no fatigue. Symptoms are stable. Diabetic complications include heart disease and peripheral neuropathy. Risk factors for coronary artery disease include dyslipidemia, diabetes mellitus, hypertension, sedentary lifestyle and post-menopausal. She is following a generally unhealthy diet. Her overall blood glucose range is 110-130 mg/dl. An ACE inhibitor/angiotensin II receptor blocker is being taken.  Back Pain This is a chronic problem. The current episode  started more than 1 year ago. The problem occurs intermittently. The pain is present in the lumbar spine and thoracic spine. The pain is at a severity of 9/10. The pain is moderate.      Review of Systems  Constitutional:  Negative for fatigue and malaise/fatigue.  Eyes:  Negative for blurred vision.  Respiratory:  Negative for shortness of breath.   Musculoskeletal:  Positive for back pain.       Objective:   Physical Exam Vitals reviewed.  Constitutional:      General: She is not in acute distress.    Appearance: She is well-developed. She is obese.  HENT:     Head: Normocephalic and atraumatic.     Right Ear: Tympanic membrane normal.     Left Ear: Tympanic membrane normal.  Eyes:     Pupils: Pupils are equal, round, and reactive to light.  Neck:     Thyroid: No thyromegaly.  Cardiovascular:     Rate and Rhythm: Normal rate and regular rhythm.     Heart sounds: Normal heart sounds. No murmur heard. Pulmonary:     Effort: Pulmonary effort is normal. No respiratory distress.     Breath sounds: Normal breath sounds. No wheezing.  Abdominal:     General: Bowel sounds are normal. There is no distension.     Palpations: Abdomen is soft.     Tenderness: There is no abdominal tenderness.  Musculoskeletal:        General: Tenderness (mild thoraic pain wiht flexion) present. Normal range of motion.     Cervical back: Normal range of motion and neck supple.  Skin:    General: Skin is warm and dry.  Neurological:     Mental Status: She is alert and oriented to person, place, and time.  Cranial Nerves: No cranial nerve deficit.     Deep Tendon Reflexes: Reflexes are normal and symmetric.  Psychiatric:        Behavior: Behavior normal.        Thought Content: Thought content normal.        Judgment: Judgment normal.     BP (!) 130/56   Pulse 67   Temp 97.6 F (36.4 C)   Ht '5\' 3"'$  (1.6 m)   Wt 175 lb (79.4 kg)   SpO2 98%   BMI 31.00 kg/m      Assessment & Plan:   Holly Price comes in today with chief complaint of Medical Management of Chronic Issues, Hyperlipidemia, and Hypertension   Diagnosis and orders addressed:  1. Hypertension associated with diabetes ([place]) - amLODipine (NORVASC) 5 MG tablet; Take 1 tablet (5 mg total) by mouth daily.  Dispense: 90 tablet; Refill: 1 - lisinopril-hydrochlorothiazide (ZESTORETIC) 20-12.5 MG tablet; Take 1 tablet by mouth daily.  Dispense: 90 tablet; Refill: 1  2. Mid back pain - baclofen (LIORESAL) 10 MG tablet; TAKE 1/2 TABLET BY MOUTH 3 TIMES DAILY  Dispense: 90 each; Refill: 2 - diclofenac Sodium (VOLTAREN) 1 % GEL; Apply 2 g topically 4 (four) times daily.  Dispense: 350 g; Refill: 1 - meloxicam (MOBIC) 15 MG tablet; TAKE ONE (1) TABLET BY MOUTH EVERY DAY  Dispense: 90 tablet; Refill: 0 - traMADol (ULTRAM) 50 MG tablet; Take 1 tablet (50 mg total) by mouth every 8 (eight) hours as needed.  Dispense: 20 tablet; Refill: 2  3. Cervical post-laminectomy syndrome - baclofen (LIORESAL) 10 MG tablet; TAKE 1/2 TABLET BY MOUTH 3 TIMES DAILY  Dispense: 90 each; Refill: 2  4. Diabetes mellitus due to underlying condition with hyperosmolarity without coma, without long-term current use of insulin (HCC) - liraglutide (VICTOZA) 18 MG/3ML SOPN; Inject 1.8 mg into the skin every evening.  Dispense: 12 mL; Refill: 2  5. Diabetic polyneuropathy associated with type 2 diabetes mellitus (HCC) - liraglutide (VICTOZA) 18 MG/3ML SOPN; Inject 1.8 mg into the skin every evening.  Dispense: 12 mL; Refill: 2  6. Hyperlipidemia associated with type 2 diabetes mellitus (HCC) - Pitavastatin Calcium (LIVALO) 2 MG TABS; Take 1 tablet (2 mg total) by mouth daily.  Dispense: 90 tablet; Refill: 1  7. Pain in joint of right shoulder - traMADol (ULTRAM) 50 MG tablet; Take 1 tablet (50 mg total) by mouth every 8 (eight) hours as needed.  Dispense: 20 tablet; Refill: 2  8. Dementia with anxiety, unspecified dementia severity,  unspecified dementia type (HCC)  9. Cervical radiculitis  10. Controlled substance agreement signed - traMADol (ULTRAM) 50 MG tablet; Take 1 tablet (50 mg total) by mouth every 8 (eight) hours as needed.  Dispense: 20 tablet; Refill: 2  11. History of DVT (deep vein thrombosis)  12. Vitamin D deficiency  13. Chronic right-sided thoracic back pain - traMADol (ULTRAM) 50 MG tablet; Take 1 tablet (50 mg total) by mouth every 8 (eight) hours as needed.  Dispense: 20 tablet; Refill: 2 - DG Thoracic Spine 2 View   Labs pending Patient reviewed in [place] controlled database, no flags noted. Contract and drug screen are up to date.  Health Maintenance reviewed Diet and exercise encouraged  Follow up plan: 3 months    Evelina Dun, FNP

## 2021-11-30 NOTE — Patient Instructions (Signed)
Thoracic Strain A thoracic strain, which is sometimes called a mid-back strain, is an injury to the muscles or tendons that attach to the upper part of your back behind your chest. This type of injury occurs when a muscle is overstretched or overloaded. Thoracic strains can range from mild to severe. Mild strains may involve stretching a muscle or tendon without tearing it. These injuries may heal in 1-2 weeks. More severe strains involve tearing of muscle fibers or tendons. These will cause more pain and may take 6-8 weeks to heal. What are the causes? This condition may be caused by: Trauma, such as a fall or a hit to the body. Twisting or overstretching the back. This may result from doing activities that require a lot of energy, such as lifting heavy objects. In some cases, the cause may not be known. What increases the risk? This injury is more common in: Athletes. People with obesity. What are the signs or symptoms? The main symptom of this condition is pain in the middle back, especially with movement. Other symptoms include: Stiffness or limited range of motion. Sudden muscle tightening (spasms). How is this diagnosed? This condition may be diagnosed based on: Your symptoms. Your medical history. A physical exam. Imaging tests, such as X-rays or an MRI. How is this treated? This condition may be treated with: Resting the injured area. Applying heat and cold to the injured area. Over-the-counter medicines for pain and inflammation, such as NSAIDs. Prescription pain medicine or muscle relaxants may be needed for a short time. Physical therapy. This will involve doing stretching and strengthening exercises. Follow these instructions at home: Managing pain, stiffness, and swelling     If directed, put ice on the injured area. Put ice in a plastic bag. Place a towel between your skin and the bag. Leave the ice on for 20 minutes, 2-3 times a day. If directed, apply heat to the  affected area as often as told by your health care provider. Use the heat source that your health care provider recommends, such as a moist heat pack or a heating pad. Place a towel between your skin and the heat source. Leave the heat on for 20-30 minutes. Remove the heat if your skin turns bright red. This is especially important if you are unable to feel pain, heat, or cold. You may have a greater risk of getting burned. Activity Rest and return to your normal activities as told by your health care provider. Ask your health care provider what activities are safe for you. Do exercises as told by your health care provider. Medicines Take over-the-counter and prescription medicines only as told by your health care provider. Ask your health care provider if the medicine prescribed to you: Requires you to avoid driving or using heavy machinery. Can cause constipation. You may need to take these actions to prevent or treat constipation: Drink enough fluid to keep your urine pale yellow. Take over-the-counter or prescription medicines. Eat foods that are high in fiber, such as beans, whole grains, and fresh fruits and vegetables. Limit foods that are high in fat and processed sugars, such as fried or sweet foods. Injury prevention To prevent a future mid-back injury: Always warm up properly before physical activity or sports. Cool down and stretch after being active. Use correct form when playing sports and lifting heavy objects. Bend your knees before you lift heavy objects. Use good posture when sitting and standing. Stay physically fit and maintain a healthy weight. Do at least   150 minutes of moderate-intensity exercise each week, such as brisk walking or water aerobics. Do strength exercises at least 2 times each week.  General instructions Do not use any products that contain nicotine or tobacco, such as cigarettes, e-cigarettes, and chewing tobacco. If you need help quitting, ask your  health care provider. Keep all follow-up visits as told by your health care provider. This is important. Contact a health care provider if: Your pain is not helped by medicine. Your pain or stiffness is getting worse. You develop pain or stiffness in your neck or lower back. Get help right away if you: Have shortness of breath. Have chest pain. Develop numbness or weakness in your legs or arms. Have involuntary loss of urine (urinary incontinence). Summary A thoracic strain, which is sometimes called a mid-back strain, is an injury to the muscles or tendons that attach to the upper part of your back behind your chest. This type of injury occurs when a muscle is overstretched or overloaded. Rest and return to your normal activities as told by your health care provider. If directed, apply heat or ice to the affected area as often as told by your health care provider. Take over-the-counter and prescription medicines only as told by your health care provider. Contact a health care provider if you have new or worsening symptoms. This information is not intended to replace advice given to you by your health care provider. Make sure you discuss any questions you have with your health care provider. Document Revised: 03/15/2021 Document Reviewed: 03/15/2021 [employer] Patient Education  2023 [employer]  

## 2021-12-05 ENCOUNTER — Telehealth: Payer: Medicare Other

## 2022-01-05 ENCOUNTER — Ambulatory Visit (INDEPENDENT_AMBULATORY_CARE_PROVIDER_SITE_OTHER): Payer: Medicare Other | Admitting: Pharmacist

## 2022-01-05 ENCOUNTER — Telehealth: Payer: Medicare Other

## 2022-01-05 DIAGNOSIS — G720 Drug-induced myopathy: Secondary | ICD-10-CM

## 2022-01-05 DIAGNOSIS — E0800 Diabetes mellitus due to underlying condition with hyperosmolarity without nonketotic hyperglycemic-hyperosmolar coma (NKHHC): Secondary | ICD-10-CM

## 2022-01-05 DIAGNOSIS — E1169 Type 2 diabetes mellitus with other specified complication: Secondary | ICD-10-CM

## 2022-01-10 NOTE — Progress Notes (Signed)
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 (decreased from 83 in 05/2021) Denies personal and family history of Medullary thyroid cancer (MTC) Would like to transition to once weekly GLP1 if patient would like to transition at some point Enrolled in [employer] patient assistance program April 2023 Current glucose readings: fasting glucose: <130, post prandial glucose: <180 New traditional glucometer called in to advanced diabetes supply for $0 copay (via parachute portal) Denies hypoglycemic/hyperglycemic symptoms Discussed meal planning options and Plate method for healthy eating Avoid sugary drinks and desserts Incorporate balanced protein, non starchy veggies, 1 serving of carbohydrate with each meal Increase water intake Increase physical activity as able Current exercise: n/a Educated on medications-purpose and side effects; diet-heart healthy/plate method Recommended heart healthy/plate method--will help with hyperlipidemia as well Livalo called in to the drug store [place]  Assessed patient finances. Enrolled in [employer] patient assistance program for victoza New onset dementia--patient was recently seen by PCP and started on Aricept & namenda  Patient Goals/Self-Care Activities patient will:  - take medications as prescribed as evidenced by patient report and record review check glucose DAILY (FASTING), document, and provide at future appointments collaborate with provider on medication access solutions engage in dietary modifications by  FOLLOWING HEALTHY PLATE METHOD  Subjective: Holly Price 70 y.71 year old female who is a primary patient of HawksSharion Balloon.  The CCM team was consulted for assistance with disease management and care coordination needs.    Engaged with patient by telephone for follow up visit in response to provider referral for pharmacy case management and/or care coordination services.   Consent to Services:  The patient was given information about Chronic Care Management services, agreed to services, and gave verbal consent prior to initiation of services.  Please see initial visit note for detailed documentation.   Patient Care Team: HawksSharion Balloon as PCP - General (Nurse Practitioner) PruitLavera Guise [facility] (Family Medicine)  Objective:  Lab Results  Component Value Date   CREATININE 1.03 (H) 09/14/2021   CREATININE 0.78 06/16/2021   CREATININE 0.76 03/14/2021    Lab Results  Component Value Date   HGBA1C 6.4 (H) 09/14/2021   Last diabetic Eye exam:  Lab Results  Component Value Date/Time   HMDIABEYEEXA No Retinopathy 08/30/2015 12:00 AM    Last diabetic Foot exam: No results found for: "HMDIABFOOTEX"      Component Value Date/Time   CHOL 166 04/01/2020 1000   CHOL 203 (H) 11/14/2012 0932   TRIG 122 04/01/2020 1000   TRIG 143 11/14/2012 0932   HDL 59 04/01/2020 1000   HDL 42 11/14/2012 0932   CHOLHDL 2.8 04/01/2020 1000   CHOLHDL 3.0 06/16/2018 1254   VLDL 36 06/16/2018 1254   LDLCALC 85 04/01/2020 1000   LDLCALC 132 (H) 11/14/2012 0932       Latest Ref Rng & Units 09/14/2021   12:10 PM 06/16/2021    9:15 AM 03/14/2021   11:02 AM  Hepatic Function  Total Protein 6.0 - 8.5 g/dL 6.7  6.7  6.9   Albumin 3.8 - 4.8 g/dL 4.4  4.6  4.5   AST 0 - 40 IU/L 24  19  15    ALT 0 - 32 IU/L 20  18  12    Alk Phosphatase 44 - 121 IU/L 41  42  44   Total Bilirubin 0.0 - 1.2 mg/dL 0.5  0.5  0.4     Lab Results  Component Value Date/Time   TSH 1.110  02/06/2019 09:12 AM   TSH 1.102 06/16/2018 12:55 PM   TSH 0.910 06/16/2018 10:49 AM       Latest Ref Rng & Units 06/16/2021    9:15 AM 12/01/2020   10:09 AM 04/01/2020   10:00 AM  CBC  WBC 3.4 - 10.8 x10E3/uL 8.8  8.4  10.4   Hemoglobin 11.1 - 15.9 g/dL 15.6  16.2  15.6   Hematocrit 34.0 - 46.6 % 45.8  48.3  46.6   Platelets 150 - 450 x10E3/uL 232  256  259     Lab Results  Component Value Date/Time   VD25OH 95.5 02/06/2019 09:12 AM   VD25OH 83.7 06/16/2018 12:54 PM    Clinical ASCVD: No  The 10-year ASCVD risk score (Arnett DK, et al., 2019) is: 17.8%   Values used to calculate the score:     Age: 10 years     Sex: Female     Is Non-Hispanic African American: No     Diabetic: Yes     Tobacco smoker: No     Systolic Blood Pressure: 722 mmHg     Is BP treated: Yes     HDL Cholesterol: 59 mg/dL     Total Cholesterol: 166 mg/dL    Other: (CHADS2VASc if Afib, PHQ9 if depression, MMRC or CAT for COPD, ACT, DEXA)  Social History   Tobacco Use  Smoking Status Former   Packs/day: 0.50   Years: 10.00   Total pack years: 5.00   Types: Cigarettes   Quit date: 06/22/2012   Years since quitting: 9.5  Smokeless Tobacco Never   BP Readings from Last 3 Encounters:  11/30/21 (!) 130/56  09/14/21 140/69  06/16/21 (!) 145/60   Pulse Readings from Last 3 Encounters:  11/30/21 67  09/14/21 67  06/16/21 (!) 58   Wt Readings from Last 3 Encounters:  11/30/21 175 lb (79.4 kg)  09/14/21 179 lb 3.2 oz (81.3 kg)  06/16/21 187 lb (84.8 kg)    Assessment: Review of patient past medical history, allergies, medications, health status, including review of consultants reports, laboratory and other test data, was performed as part of comprehensive evaluation and provision of chronic care management services.   SDOH:  (Social Determinants of Health) assessments and interventions performed:    CCM Care Plan  No Known Allergies  Medications Reviewed Today     Reviewed by Sharion Balloon, FNP (Family Nurse Practitioner) on 11/30/21 at 1118  Med List Status: <None>   Medication Order Taking? Sig Documenting Provider Last Dose Status Informant  ACCU-CHEK AVIVA PLUS test strip [id number] Yes TWICE DAILY Sharion Balloon, FNP Taking Active Self  acetaminophen (TYLENOL) 500 MG tablet [id number] Yes Take 1,000 mg by mouth every 6 (six) hours as needed for moderate pain or headache. [provider] Taking Active Self  amLODipine (NORVASC) 5 MG tablet [id number] Yes Take 1 tablet (5 mg total) by mouth daily. Evelina Dun A, FNP Taking Active   atenolol (TENORMIN) 50 MG tablet [id number] Yes TAKE ONE (1) TABLET BY MOUTH EVERY DAY Hawks, Theador Hawthorne, FNP Taking Active  baclofen (LIORESAL) 10 MG tablet [id number] Yes TAKE 1/2 TABLET BY MOUTH 3 TIMES DAILY Hawks, Christy A, FNP Taking Active   Blood Glucose Monitoring Suppl (ACCU-CHEK AVIVA PLUS) W/DEVICE KIT [id number] Yes 1 Device by Does not apply route 2 (two) times daily. Test blood sugar twice daily.dx.250.0 Evelina Dun A, FNP Taking Active Self  Calcium Citrate-Vitamin D (CALCIUM + D PO) [id number] Yes Take 1 tablet by mouth daily. [provider] Taking Active Self  diclofenac Sodium (VOLTAREN) 1 % GEL [id number] Yes Apply 2 g topically 4 (four) times daily. Sharion Balloon, FNP Taking Active   donepezil (ARICEPT) 5 MG tablet [id number] Yes Take 1 tablet (5 mg total) by mouth at bedtime. Sharion Balloon, FNP Taking Active   EPINEPHrine 0.3 mg/0.3 mL IJ SOAJ injection [id number] Yes Inject 0.3 mLs (0.3 mg total) into the muscle as needed for anaphylaxis. Sharion Balloon, FNP Taking Active Self  liraglutide (VICTOZA) 18 MG/3ML SOPN [id number] Yes Inject 1.8 mg into the skin every evening.  Patient taking differently: Inject 1.2 mg into the skin every evening.   Sharion Balloon, FNP Taking Active            Med Note Blanca Friend, Micaila Ziemba D   Fri Sep 08, 2021  8:20 AM) Via [employer] patient assistance program   lisinopril-hydrochlorothiazide (ZESTORETIC) 20-12.5 MG tablet [id number] Yes Take 1 tablet by mouth 2 (two) times daily. Evelina Dun A, FNP Taking Active   meloxicam (MOBIC) 15 MG tablet [id number] Yes TAKE ONE (1) TABLET BY MOUTH EVERY DAY Hawks, Christy A, FNP Taking Active   memantine (NAMENDA) 5 MG tablet [id number] Yes Take 1 tablet (5 mg total) by mouth 2 (two) times daily. Sharion Balloon, FNP Taking Active   NOVOTWIST 32G X 5 MM MISC [id number] Yes USE AS DIRECTED Sharion Balloon, FNP Taking Active Self  Pitavastatin Calcium (LIVALO) 2 MG TABS [id number] Yes Take 1 tablet (2 mg total) by mouth daily. Sharion Balloon, FNP Taking Active   RESTASIS 0.05 % ophthalmic emulsion [id number] Yes Place 1 drop into both eyes 2 (two) times daily as needed (dry eyes).  [provider] Taking Active Self  traMADol (ULTRAM) 50 MG tablet [id number] Yes Take 1 tablet (50 mg total) by mouth every 8 (eight) hours as needed. Sharion Balloon, FNP Taking Active   zinc gluconate 50 MG tablet [id number] Yes Take 50 mg by mouth daily. [provider] Taking Active Self            Patient Active Problem List   Diagnosis Date Noted   Controlled substance agreement signed 06/16/2021   Positive colorectal cancer screening using Cologuard test 08/21/2019   Dementia ([place]) 02/06/2019   Obesity (BMI 30-39.9) 11/22/2015   Diabetic neuropathy ([place]) 11/22/2015   History of DVT (deep vein thrombosis) 08/19/2015   Hyperlipidemia associated with type 2 diabetes mellitus (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/25/2013   Cervical radiculitis 07/17/2013   Pain in joint, shoulder region 07/17/2013   Vitamin D deficiency 11/13/2012   Hypertension associated with diabetes ([place]) 11/13/2012   DM (diabetes mellitus) ([place]) 48/27/0786   Metabolic syndrome 75/44/9201   Cervical post-laminectomy syndrome 11/12/2012   Lumbosacral spondylosis without myelopathy 11/12/2012   Mid back pain 06/25/2012     Immunization History  Administered Date(s) Administered   Fluad Quad(high Dose 65+) 04/01/2020, 03/14/2021   Influenza, High Dose Seasonal PF 06/16/2018   Influenza,inj,Quad PF,6+ Mos 02/25/2013, 02/24/2014, 04/15/2015   Moderna Sars-Covid-2 Vaccination 07/22/2019, 08/19/2019  Pneumococcal Conjugate-13 02/15/2014   Pneumococcal Polysaccharide-23 04/29/2012, 06/16/2018   Tdap 04/29/2012   Zoster Recombinat (Shingrix) 12/01/2020, 06/16/2021   Zoster, Live 11/02/2013    Conditions to be addressed/monitored: DMII and CKD Stage 3a  Care Plan : PHARMD MEDICATION MANAGEMENT  Updates made by Lavera Guise, RPH since 01/10/2022 12:00 AM     Problem: DISEASE PROGRESSION PREVENTION      Long-Range Goal: T2DM   Recent Progress: On track  Priority: High  Note:   Current Barriers:  Unable to independently afford treatment regimen Suboptimal therapeutic regimen for T2DM  Pharmacist Clinical Goal(s):  patient will verbalize ability to afford treatment regimen adhere to plan to optimize therapeutic regimen for T2DM as evidenced by report of adherence to recommended medication management changes through collaboration with PharmD and provider.   Interventions: 1:1 collaboration with Sharion Balloon, FNP regarding development and update of comprehensive plan of care as evidenced by provider attestation and co-signature Inter-disciplinary care team collaboration (see longitudinal plan of care) Comprehensive medication review performed; medication list updated in electronic medical record  Diabetes: goal progressing-ON track-->yes Controlled--a1c now 6.4%; current treatment: VICTOZA 1.49m daily;  Continue Victoza 1.294mdaily and will titrate to 1.28m428ms tolerated Patient was previously only taking 0.6mg628mily Tolerating well-->will continue victoza 1.2mg 27mly 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 (decreased from 83 in 05/2021) Denies personal and family history of Medullary thyroid cancer  (MTC) Would like to transition to once weekly GLP1 if patient would like to transition at some point Enrolled in [employer] patient assistance program April 2023 Current glucose readings: fasting glucose: <130, post prandial glucose: <180 New traditional glucometer called in to advanced diabetes supply for $0 copay (via parachute portal) Denies hypoglycemic/hyperglycemic symptoms Discussed meal planning options and Plate method for healthy eating Avoid sugary drinks and desserts Incorporate balanced protein, non starchy veggies, 1 serving of carbohydrate with each meal Increase water intake Increase physical activity as able Current exercise: n/a Educated on medications-purpose and side effects; diet-heart healthy/plate method Recommended heart healthy/plate method--will help with hyperlipidemia as well Livalo called in to the drug store [place] assistance  Assessed patient finances. Enrolled in [employer] patient assistance program for victoza New onset dementia--patient was recently seen by PCP and started on Aricept & namenda  Patient Goals/Self-Care Activities patient will:  - take medications as prescribed as evidenced by patient report and record review check glucose DAILY (FASTING), document, and provide at future appointments collaborate with provider on medication access solutions engage in dietary modifications by FOLLOWING HEALTHY PLATE METHOD      Medication Assistance:  victoza obtained through [employer] medication assistance program.  Enrollment ends 05/27/22  [facility]: [phone]: Telephone follow up appointment with care management team member scheduled for:  3 months   JulieRegina EckrmD, BCPS Clinical Pharmacist, [place] Phone [phone]

## 2022-01-10 NOTE — Patient Instructions (Signed)
Visit Information  Following are the goals we discussed today:  Current Barriers:  Unable to independently afford treatment regimen Suboptimal therapeutic regimen for T2DM  Pharmacist Clinical Goal(s):  patient will verbalize ability to afford treatment regimen adhere to plan to optimize therapeutic regimen for T2DM as evidenced by report of adherence to recommended medication management changes through collaboration with PharmD and provider.   Interventions: 1:1 collaboration with Sharion Balloon, FNP regarding development and update of comprehensive plan of care as evidenced by provider attestation and co-signature Inter-disciplinary care team collaboration (see longitudinal plan of care) Comprehensive medication review performed; medication list updated in electronic medical record  Diabetes: goal progressing-ON track-->yes Controlled--a1c now 6.4%; current treatment: VICTOZA 1.'2mg'$  daily;  Continue Victoza 1.'2mg'$  daily and will titrate to 1.'8mg'$  as tolerated Patient was previously only taking 0.'6mg'$  daily Tolerating well-->will continue victoza 1.'2mg'$  daily 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 (decreased from 83 in 05/2021) Denies personal and family history of Medullary thyroid cancer (MTC) Would like to transition to once weekly GLP1 if patient would like to transition at some point Enrolled in [employer] patient assistance program April 2023 Current glucose readings: fasting glucose: <130, post prandial glucose: <180 New traditional glucometer called in to advanced diabetes supply for $0 copay (via parachute portal) Denies hypoglycemic/hyperglycemic symptoms Discussed meal planning options and Plate method for healthy eating Avoid sugary drinks and desserts Incorporate balanced protein, non starchy veggies, 1 serving of carbohydrate with each meal Increase water intake Increase physical activity as able Current exercise: n/a Educated on medications-purpose and side effects; diet-heart  healthy/plate method Recommended heart healthy/plate method--will help with hyperlipidemia as well Livalo called in to the drug store Enrolled in [place] for patient assistance  Assessed patient finances. Enrolled in [employer] patient assistance program for victoza New onset dementia--patient was recently seen by PCP and started on Aricept & namenda  Patient Goals/Self-Care Activities patient will:  - take medications as prescribed as evidenced by patient report and record review check glucose DAILY (FASTING), document, and provide at future appointments collaborate with provider on medication access solutions engage in dietary modifications by FOLLOWING HEALTHY PLATE METHOD   Plan: Telephone follow up appointment with care management team member scheduled for:  3 months  Signature Regina Eck, PharmD, BCPS Clinical Pharmacist, [place]  [place] Phone [phone]   Please call the care guide team at [phone] if you need to cancel or reschedule your appointment.   The patient verbalized understanding of instructions, educational materials, and care plan provided today and DECLINED offer to receive copy of patient instructions, educational materials, and care plan.

## 2022-01-19 ENCOUNTER — Other Ambulatory Visit: Payer: Self-pay | Admitting: Family

## 2022-01-19 DIAGNOSIS — Z79899 Other long term (current) drug therapy: Secondary | ICD-10-CM

## 2022-01-19 DIAGNOSIS — G8929 Other chronic pain: Secondary | ICD-10-CM

## 2022-01-19 DIAGNOSIS — M549 Dorsalgia, unspecified: Secondary | ICD-10-CM

## 2022-01-19 DIAGNOSIS — E1159 Type 2 diabetes mellitus with other circulatory complications: Secondary | ICD-10-CM

## 2022-01-19 DIAGNOSIS — M25511 Pain in right shoulder: Secondary | ICD-10-CM

## 2022-01-25 DIAGNOSIS — E1169 Type 2 diabetes mellitus with other specified complication: Secondary | ICD-10-CM

## 2022-01-25 DIAGNOSIS — E785 Hyperlipidemia, unspecified: Secondary | ICD-10-CM

## 2022-01-25 DIAGNOSIS — E0800 Diabetes mellitus due to underlying condition with hyperosmolarity without nonketotic hyperglycemic-hyperosmolar coma (NKHHC): Secondary | ICD-10-CM

## 2022-02-20 ENCOUNTER — Telehealth: Payer: Self-pay

## 2022-02-20 NOTE — Telephone Encounter (Signed)
Received 4 Victoza pens from patient assistance - patient notified ready to p/u

## 2022-03-05 ENCOUNTER — Ambulatory Visit (INDEPENDENT_AMBULATORY_CARE_PROVIDER_SITE_OTHER): Payer: Medicare Other | Admitting: Family

## 2022-03-05 ENCOUNTER — Encounter: Payer: Self-pay | Admitting: Family

## 2022-03-05 VITALS — BP 112/57 | HR 60 | Temp 96.8°F | Ht 63.0 in | Wt 165.2 lb

## 2022-03-05 DIAGNOSIS — E1169 Type 2 diabetes mellitus with other specified complication: Secondary | ICD-10-CM

## 2022-03-05 DIAGNOSIS — M961 Postlaminectomy syndrome, not elsewhere classified: Secondary | ICD-10-CM

## 2022-03-05 DIAGNOSIS — Z86718 Personal history of other venous thrombosis and embolism: Secondary | ICD-10-CM

## 2022-03-05 DIAGNOSIS — M25511 Pain in right shoulder: Secondary | ICD-10-CM | POA: Diagnosis not present

## 2022-03-05 DIAGNOSIS — F0394 Unspecified dementia, unspecified severity, with anxiety: Secondary | ICD-10-CM | POA: Diagnosis not present

## 2022-03-05 DIAGNOSIS — M546 Pain in thoracic spine: Secondary | ICD-10-CM

## 2022-03-05 DIAGNOSIS — E785 Hyperlipidemia, unspecified: Secondary | ICD-10-CM

## 2022-03-05 DIAGNOSIS — Z0001 Encounter for general adult medical examination with abnormal findings: Secondary | ICD-10-CM

## 2022-03-05 DIAGNOSIS — Z23 Encounter for immunization: Secondary | ICD-10-CM

## 2022-03-05 DIAGNOSIS — E1159 Type 2 diabetes mellitus with other circulatory complications: Secondary | ICD-10-CM

## 2022-03-05 DIAGNOSIS — E1142 Type 2 diabetes mellitus with diabetic polyneuropathy: Secondary | ICD-10-CM

## 2022-03-05 DIAGNOSIS — Z79899 Other long term (current) drug therapy: Secondary | ICD-10-CM

## 2022-03-05 DIAGNOSIS — Z0000 Encounter for general adult medical examination without abnormal findings: Secondary | ICD-10-CM

## 2022-03-05 DIAGNOSIS — E0800 Diabetes mellitus due to underlying condition with hyperosmolarity without nonketotic hyperglycemic-hyperosmolar coma (NKHHC): Secondary | ICD-10-CM

## 2022-03-05 DIAGNOSIS — G8929 Other chronic pain: Secondary | ICD-10-CM

## 2022-03-05 DIAGNOSIS — M549 Dorsalgia, unspecified: Secondary | ICD-10-CM | POA: Diagnosis not present

## 2022-03-05 DIAGNOSIS — M5412 Radiculopathy, cervical region: Secondary | ICD-10-CM

## 2022-03-05 DIAGNOSIS — I152 Hypertension secondary to endocrine disorders: Secondary | ICD-10-CM

## 2022-03-05 DIAGNOSIS — E559 Vitamin D deficiency, unspecified: Secondary | ICD-10-CM

## 2022-03-05 LAB — BAYER DCA HB A1C WAIVED: HB A1C (BAYER DCA - WAIVED): 6.1 % — ABNORMAL HIGH (ref 4.8–5.6)

## 2022-03-05 MED ORDER — VICTOZA 18 MG/3ML ~~LOC~~ SOPN: 1.8000 mg | PEN_INJECTOR | Freq: Every evening | SUBCUTANEOUS | 2 refills | Status: DC

## 2022-03-05 MED ORDER — MELOXICAM 15 MG PO TABS: ORAL_TABLET | ORAL | 0 refills | Status: DC

## 2022-03-05 MED ORDER — MEMANTINE HCL 5 MG PO TABS: 5.0000 mg | ORAL_TABLET | Freq: Two times a day (BID) | ORAL | 1 refills | Status: DC

## 2022-03-05 MED ORDER — DICLOFENAC SODIUM 1 % EX GEL: 2.0000 g | Freq: Four times a day (QID) | CUTANEOUS | 1 refills | Status: DC

## 2022-03-05 MED ORDER — LISINOPRIL-HYDROCHLOROTHIAZIDE 20-12.5 MG PO TABS: 1.0000 | ORAL_TABLET | Freq: Every day | ORAL | 1 refills | Status: DC

## 2022-03-05 MED ORDER — BLOOD GLUCOSE METER KIT: PACK | 0 refills | Status: DC

## 2022-03-05 MED ORDER — ATENOLOL 50 MG PO TABS: ORAL_TABLET | ORAL | 1 refills | Status: DC

## 2022-03-05 MED ORDER — DONEPEZIL HCL 5 MG PO TABS: 5.0000 mg | ORAL_TABLET | Freq: Every day | ORAL | 1 refills | Status: DC

## 2022-03-05 MED ORDER — AMLODIPINE BESYLATE 5 MG PO TABS: 5.0000 mg | ORAL_TABLET | Freq: Every day | ORAL | 1 refills | Status: DC

## 2022-03-05 MED ORDER — BACLOFEN 10 MG PO TABS: ORAL_TABLET | ORAL | 2 refills | Status: DC

## 2022-03-05 MED ORDER — LIVALO 2 MG PO TABS: 2.0000 mg | ORAL_TABLET | Freq: Every day | ORAL | 1 refills | Status: DC

## 2022-03-05 NOTE — Addendum Note (Signed)
Addended by: Brynda Peon F on: 03/05/2022 10:06 AM   Modules accepted: Orders

## 2022-03-05 NOTE — Patient Instructions (Signed)
Health Maintenance After Age 69 After age 69, you are at a higher risk for certain long-term diseases and infections as well as injuries from falls. Falls are a major cause of broken bones and head injuries in people who are older than age 69. Getting regular preventive care can help to keep you healthy and well. Preventive care includes getting regular testing and making lifestyle changes as recommended by your health care provider. Talk with your health care provider about: Which screenings and tests you should have. A screening is a test that checks for a disease when you have no symptoms. A diet and exercise plan that is right for you. What should I know about screenings and tests to prevent falls? Screening and testing are the best ways to find a health problem early. Early diagnosis and treatment give you the best chance of managing medical conditions that are common after age 69. Certain conditions and lifestyle choices may make you more likely to have a fall. Your health care provider may recommend: Regular vision checks. Poor vision and conditions such as cataracts can make you more likely to have a fall. If you wear glasses, make sure to get your prescription updated if your vision changes. Medicine review. Work with your health care provider to regularly review all of the medicines you are taking, including over-the-counter medicines. Ask your health care provider about any side effects that may make you more likely to have a fall. Tell your health care provider if any medicines that you take make you feel dizzy or sleepy. Strength and balance checks. Your health care provider may recommend certain tests to check your strength and balance while standing, walking, or changing positions. Foot health exam. Foot pain and numbness, as well as not wearing proper footwear, can make you more likely to have a fall. Screenings, including: Osteoporosis screening. Osteoporosis is a condition that causes  the bones to get weaker and break more easily. Blood pressure screening. Blood pressure changes and medicines to control blood pressure can make you feel dizzy. Depression screening. You may be more likely to have a fall if you have a fear of falling, feel depressed, or feel unable to do activities that you used to do. Alcohol use screening. Using too much alcohol can affect your balance and may make you more likely to have a fall. Follow these instructions at home: Lifestyle Do not drink alcohol if: Your health care provider tells you not to drink. If you drink alcohol: Limit how much you have to: 0-1 drink a day for women. 0-2 drinks a day for men. Know how much alcohol is in your drink. In the [place] one drink equals one 12 oz bottle of beer (355 mL), one 5 oz glass of wine (148 mL), or one 1 oz glass of hard liquor (44 mL). Do not use any products that contain nicotine or tobacco. These products include cigarettes, chewing tobacco, and vaping devices, such as e-cigarettes. If you need help quitting, ask your health care provider. Activity  Follow a regular exercise program to stay fit. This will help you maintain your balance. Ask your health care provider what types of exercise are appropriate for you. If you need a cane or walker, use it as recommended by your health care provider. Wear supportive shoes that have nonskid soles. Safety  Remove any tripping hazards, such as rugs, cords, and clutter. Install safety equipment such as grab bars in bathrooms and safety rails on stairs. Keep rooms and walkways   well-lit. General instructions Talk with your health care provider about your risks for falling. Tell your health care provider if: You fall. Be sure to tell your health care provider about all falls, even ones that seem minor. You feel dizzy, tiredness (fatigue), or off-balance. Take over-the-counter and prescription medicines only as told by your health care provider. These include  supplements. Eat a healthy diet and maintain a healthy weight. A healthy diet includes low-fat dairy products, low-fat (lean) meats, and fiber from whole grains, beans, and lots of fruits and vegetables. Stay current with your vaccines. Schedule regular health, dental, and eye exams. Summary Having a healthy lifestyle and getting preventive care can help to protect your health and wellness after age 69. Screening and testing are the best way to find a health problem early and help you avoid having a fall. Early diagnosis and treatment give you the best chance for managing medical conditions that are more common for people who are older than age 69. Falls are a major cause of broken bones and head injuries in people who are older than age 69. Take precautions to prevent a fall at home. Work with your health care provider to learn what changes you can make to improve your health and wellness and to prevent falls. This information is not intended to replace advice given to you by your health care provider. Make sure you discuss any questions you have with your health care provider. Document Revised: 10/03/2020 Document Reviewed: 10/03/2020 Elsevier Patient Education  2023 [employer]  

## 2022-03-05 NOTE — Progress Notes (Signed)
Subjective:    Patient ID: Holly Price, female    DOB: Sep 28, 1950, 71 y.o.   MRN: [id number]  Chief Complaint  Patient presents with   Medical Management of Chronic Issues    FLU SHOT TODAY    PT presents to the office today for CPE and chronic follow up. She has a hx of DVT and was taken off her xarelto in February 2020.    She reports her memory is stable, but admits to being forgetful at times.  Hypertension The problem has been resolved since onset. The problem is controlled. Pertinent negatives include no blurred vision, chest pain, malaise/fatigue, peripheral edema or shortness of breath. Risk factors for coronary artery disease include dyslipidemia, diabetes mellitus, obesity and sedentary lifestyle. The current treatment provides moderate improvement. Hypertensive end-organ damage includes heart failure.  Diabetes She presents for her follow-up diabetic visit. She has type 2 diabetes mellitus. There are no hypoglycemic associated symptoms. Associated symptoms include foot paresthesias. Pertinent negatives for diabetes include no blurred vision and no chest pain. Diabetic complications include peripheral neuropathy. Risk factors for coronary artery disease include dyslipidemia, diabetes mellitus, hypertension, sedentary lifestyle and post-menopausal. She is following a generally unhealthy diet. Her dinner blood glucose range is generally 110-130 mg/dl. Eye exam is not current.  Hyperlipidemia This is a chronic problem. The current episode started more than 1 year ago. The problem is controlled. Recent lipid tests were reviewed and are normal. Exacerbating diseases include obesity. Pertinent negatives include no chest pain or shortness of breath. Current antihyperlipidemic treatment includes statins. The current treatment provides moderate improvement of lipids. Risk factors for coronary artery disease include dyslipidemia, diabetes mellitus, hypertension, a sedentary lifestyle and  post-menopausal.  Arthritis Presents for follow-up visit. She complains of pain and stiffness. Affected locations include the left knee, right knee, left MCP and right MCP (back). Her pain is at a severity of 3/10.      Review of Systems  Constitutional:  Negative for malaise/fatigue.  Eyes:  Negative for blurred vision.  Respiratory:  Negative for shortness of breath.   Cardiovascular:  Negative for chest pain.  Musculoskeletal:  Positive for stiffness.  All other systems reviewed and are negative.  Family History  Problem Relation Age of Onset   Cancer Mother    Heart attack Father    Hypertension Son    Heart attack Sister    Colon cancer Neg Hx    Social History   Socioeconomic History   Marital status: Married    Spouse name: Denyse Amass    Number of children: 1   Years of education: 11th   Highest education level: 11th grade  Occupational History   Occupation: Retired     Comment: CNA  Tobacco Use   Smoking status: Former    Packs/day: 0.50    Years: 10.00    Total pack years: 5.00    Types: Cigarettes    Quit date: 06/22/2012    Years since quitting: 9.7   Smokeless tobacco: Never  Vaping Use   Vaping Use: Never used  Substance and Sexual Activity   Alcohol use: Not Currently   Drug use: Not Currently   Sexual activity: Not Currently  Other Topics Concern   Not on file  Social History Narrative   Lives with husband of over 39 years   Son lives on the same street.   Social Determinants of Health   Financial Resource Strain: Low Risk  (05/10/2021)   Overall Financial Resource Strain (CARDIA)  Difficulty of Paying Living Expenses: Not hard at all  Food Insecurity: No Food Insecurity (05/10/2021)   Hunger Vital Sign    Worried About Running Out of Food in the Last Year: Never true    Ran Out of Food in the Last Year: Never true  Transportation Needs: No Transportation Needs (05/10/2021)   PRAPARE - Hydrologist (Medical): No     Lack of Transportation (Non-Medical): No  Physical Activity: Insufficiently Active (05/10/2021)   Exercise Vital Sign    Days of Exercise per Week: 7 days    Minutes of Exercise per Session: 20 min  Stress: No Stress Concern Present (05/10/2021)   [place]    Feeling of Stress : Not at all  Social Connections: Moderately Integrated (05/10/2021)   Social Connection and Isolation Panel [NHANES]    Frequency of Communication with Friends and Family: More than three times a week    Frequency of Social Gatherings with Friends and Family: More than three times a week    Attends Religious Services: More than 4 times per year    Active Member of [employer] or Organizations: No    Attends Archivist Meetings: Never    Marital Status: Married       Objective:   Physical Exam Vitals reviewed.  Constitutional:      General: She is not in acute distress.    Appearance: She is well-developed. She is obese.  HENT:     Head: Normocephalic and atraumatic.     Right Ear: Tympanic membrane normal.     Left Ear: Tympanic membrane normal.  Eyes:     Pupils: Pupils are equal, round, and reactive to light.  Neck:     Thyroid: No thyromegaly.  Cardiovascular:     Rate and Rhythm: Normal rate and regular rhythm.     Heart sounds: Normal heart sounds. No murmur heard. Pulmonary:     Effort: Pulmonary effort is normal. No respiratory distress.     Breath sounds: Normal breath sounds. No wheezing.  Abdominal:     General: Bowel sounds are normal. There is no distension.     Palpations: Abdomen is soft.     Tenderness: There is no abdominal tenderness.  Musculoskeletal:        General: No tenderness. Normal range of motion.     Cervical back: Normal range of motion and neck supple.  Skin:    General: Skin is warm and dry.  Neurological:     Mental Status: She is alert and oriented to person, place, and time.     Cranial  Nerves: No cranial nerve deficit.     Deep Tendon Reflexes: Reflexes are normal and symmetric.  Psychiatric:        Behavior: Behavior normal.        Thought Content: Thought content normal.        Judgment: Judgment normal.      BP (!) 112/57   Pulse 60   Temp (!) 96.8 F (36 C) (Temporal)   Ht 5' 3"  (1.6 m)   Wt 165 lb 3.2 oz (74.9 kg)   SpO2 99%   BMI 29.26 kg/m      Assessment & Plan:  ROIZY HAROLD comes in today with chief complaint of Medical Management of Chronic Issues (FLU SHOT TODAY )   Diagnosis and orders addressed:  1. Dementia with anxiety, unspecified dementia severity, unspecified dementia type ([place]) -  memantine (NAMENDA) 5 MG tablet; Take 1 tablet (5 mg total) by mouth 2 (two) times daily.  Dispense: 180 tablet; Refill: 1 - donepezil (ARICEPT) 5 MG tablet; Take 1 tablet (5 mg total) by mouth at bedtime.  Dispense: 90 tablet; Refill: 1 - CMP14+EGFR - CBC with Differential/Platelet  2. Mid back pain - baclofen (LIORESAL) 10 MG tablet; TAKE 1/2 TABLET BY MOUTH 3 TIMES DAILY  Dispense: 90 each; Refill: 2 - meloxicam (MOBIC) 15 MG tablet; TAKE ONE (1) TABLET BY MOUTH EVERY DAY  Dispense: 90 tablet; Refill: 0 - diclofenac Sodium (VOLTAREN) 1 % GEL; Apply 2 g topically 4 (four) times daily.  Dispense: 350 g; Refill: 1 - CMP14+EGFR - CBC with Differential/Platelet  3. Pain in joint of right shoulder - CMP14+EGFR - CBC with Differential/Platelet  4. Controlled substance agreement signed - CMP14+EGFR - CBC with Differential/Platelet  5. Chronic right-sided thoracic back pain - CMP14+EGFR - CBC with Differential/Platelet  6. Cervical post-laminectomy syndrome - baclofen (LIORESAL) 10 MG tablet; TAKE 1/2 TABLET BY MOUTH 3 TIMES DAILY  Dispense: 90 each; Refill: 2 - CMP14+EGFR - CBC with Differential/Platelet  7. Diabetes mellitus due to underlying condition with hyperosmolarity without coma, without long-term current use of insulin (HCC) - liraglutide  (VICTOZA) 18 MG/3ML SOPN; Inject 1.8 mg into the skin every evening.  Dispense: 12 mL; Refill: 2 - Microalbumin / creatinine urine ratio - Bayer DCA Hb A1c Waived - CMP14+EGFR - CBC with Differential/Platelet  8. Diabetic polyneuropathy associated with type 2 diabetes mellitus (HCC) - liraglutide (VICTOZA) 18 MG/3ML SOPN; Inject 1.8 mg into the skin every evening.  Dispense: 12 mL; Refill: 2 - CMP14+EGFR - CBC with Differential/Platelet  9. Hyperlipidemia associated with type 2 diabetes mellitus (HCC) - Pitavastatin Calcium (LIVALO) 2 MG TABS; Take 1 tablet (2 mg total) by mouth daily.  Dispense: 90 tablet; Refill: 1 - CMP14+EGFR - CBC with Differential/Platelet  10. Hypertension associated with diabetes ([place]) - lisinopril-hydrochlorothiazide (ZESTORETIC) 20-12.5 MG tablet; Take 1 tablet by mouth daily.  Dispense: 90 tablet; Refill: 1 - atenolol (TENORMIN) 50 MG tablet; TAKE ONE (1) TABLET BY MOUTH EVERY DAY  Dispense: 90 tablet; Refill: 1 - amLODipine (NORVASC) 5 MG tablet; Take 1 tablet (5 mg total) by mouth daily.  Dispense: 90 tablet; Refill: 1 - CMP14+EGFR - CBC with Differential/Platelet  11. Need for immunization against influenza  - Flu Vaccine QUAD High Dose(Fluad) - CMP14+EGFR - CBC with Differential/Platelet  12. Cervical radiculitis - CMP14+EGFR - CBC with Differential/Platelet  13. Vitamin D deficiency  - CMP14+EGFR - CBC with Differential/Platelet  14. History of DVT (deep vein thrombosis) - CMP14+EGFR - CBC with Differential/Platelet  15. Annual physical exam - Microalbumin / creatinine urine ratio - Bayer DCA Hb A1c Waived - CMP14+EGFR - CBC with Differential/Platelet - Lipid panel - TSH - VITAMIN D 25 Hydroxy (Vit-D Deficiency, Fractures)   Labs pending Health Maintenance reviewed Diet and exercise encouraged  Follow up plan: 3 months    Evelina Dun, FNP

## 2022-03-06 ENCOUNTER — Other Ambulatory Visit: Payer: Self-pay | Admitting: Family

## 2022-03-06 LAB — TSH: TSH: 0.638 u[IU]/mL (ref 0.450–4.500)

## 2022-03-06 LAB — CBC WITH DIFFERENTIAL/PLATELET
Basophils Absolute: 0.1 10*3/uL (ref 0.0–0.2)
Basos: 1 %
EOS (ABSOLUTE): 0.3 10*3/uL (ref 0.0–0.4)
Eos: 3 %
Hematocrit: 43.2 % (ref 34.0–46.6)
Hemoglobin: 14.7 g/dL (ref 11.1–15.9)
Immature Grans (Abs): 0 10*3/uL (ref 0.0–0.1)
Immature Granulocytes: 0 %
Lymphocytes Absolute: 2.4 10*3/uL (ref 0.7–3.1)
Lymphs: 26 %
MCH: 32.9 pg (ref 26.6–33.0)
MCHC: 34 g/dL (ref 31.5–35.7)
MCV: 97 fL (ref 79–97)
Monocytes Absolute: 0.9 10*3/uL (ref 0.1–0.9)
Monocytes: 10 %
Neutrophils Absolute: 5.5 10*3/uL (ref 1.4–7.0)
Neutrophils: 60 %
Platelets: 241 10*3/uL (ref 150–450)
RBC: 4.47 x10E6/uL (ref 3.77–5.28)
RDW: 12.3 % (ref 11.7–15.4)
WBC: 9.2 10*3/uL (ref 3.4–10.8)

## 2022-03-06 LAB — LIPID PANEL
Chol/HDL Ratio: 3.8 ratio (ref 0.0–4.4)
Cholesterol, Total: 216 mg/dL — ABNORMAL HIGH (ref 100–199)
HDL: 57 mg/dL (ref >39)
LDL Chol Calc (NIH): 136 mg/dL — ABNORMAL HIGH (ref 0–99)
Triglycerides: 130 mg/dL (ref 0–149)
VLDL Cholesterol Cal: 23 mg/dL (ref 5–40)

## 2022-03-06 LAB — MICROALBUMIN / CREATININE URINE RATIO
Creatinine, Urine: 267.8 mg/dL
Microalb/Creat Ratio: 22 mg/g creat (ref 0–29)
Microalbumin, Urine: 57.8 ug/mL

## 2022-03-06 LAB — CMP14+EGFR
ALT: 13 IU/L (ref 0–32)
AST: 14 IU/L (ref 0–40)
Albumin/Globulin Ratio: 1.9 (ref 1.2–2.2)
Albumin: 4.4 g/dL (ref 3.9–4.9)
Alkaline Phosphatase: 42 IU/L — ABNORMAL LOW (ref 44–121)
BUN/Creatinine Ratio: 30 — ABNORMAL HIGH (ref 12–28)
BUN: 31 mg/dL — ABNORMAL HIGH (ref 8–27)
Bilirubin Total: 0.5 mg/dL (ref 0.0–1.2)
CO2: 24 mmol/L (ref 20–29)
Calcium: 10 mg/dL (ref 8.7–10.3)
Chloride: 101 mmol/L (ref 96–106)
Creatinine, Ser: 1.05 mg/dL — ABNORMAL HIGH (ref 0.57–1.00)
Globulin, Total: 2.3 g/dL (ref 1.5–4.5)
Glucose: 105 mg/dL — ABNORMAL HIGH (ref 70–99)
Potassium: 4.4 mmol/L (ref 3.5–5.2)
Sodium: 141 mmol/L (ref 134–144)
Total Protein: 6.7 g/dL (ref 6.0–8.5)
eGFR: 58 mL/min/{1.73_m2} — ABNORMAL LOW (ref >59)

## 2022-03-06 LAB — VITAMIN D 25 HYDROXY (VIT D DEFICIENCY, FRACTURES): Vit D, 25-Hydroxy: 53.9 ng/mL (ref 30.0–100.0)

## 2022-04-04 ENCOUNTER — Telehealth: Payer: Self-pay | Admitting: Family Medicine

## 2022-04-04 NOTE — Telephone Encounter (Signed)
Pharmacy called and aware med is approved.  This request has been approved. [employer] will send a letter to the member and you regarding this decision. Please see additional information at the bottom of this reques

## 2022-04-04 NOTE — Telephone Encounter (Signed)
Sent to plan today

## 2022-04-13 ENCOUNTER — Other Ambulatory Visit: Payer: Self-pay | Admitting: Family

## 2022-04-25 ENCOUNTER — Telehealth: Payer: Self-pay | Admitting: Pharmacist

## 2022-04-25 ENCOUNTER — Ambulatory Visit (INDEPENDENT_AMBULATORY_CARE_PROVIDER_SITE_OTHER): Payer: Medicare Other | Admitting: Pharmacist

## 2022-04-25 DIAGNOSIS — G720 Drug-induced myopathy: Secondary | ICD-10-CM

## 2022-04-25 DIAGNOSIS — E1169 Type 2 diabetes mellitus with other specified complication: Secondary | ICD-10-CM

## 2022-04-25 DIAGNOSIS — E0800 Diabetes mellitus due to underlying condition with hyperosmolarity without nonketotic hyperglycemic-hyperosmolar coma (NKHHC): Secondary | ICD-10-CM

## 2022-04-25 NOTE — Telephone Encounter (Signed)
Please mail out application for victoza for 2024  Thank you!

## 2022-04-26 DIAGNOSIS — E1169 Type 2 diabetes mellitus with other specified complication: Secondary | ICD-10-CM

## 2022-04-26 DIAGNOSIS — E0800 Diabetes mellitus due to underlying condition with hyperosmolarity without nonketotic hyperglycemic-hyperosmolar coma (NKHHC): Secondary | ICD-10-CM

## 2022-04-26 DIAGNOSIS — E785 Hyperlipidemia, unspecified: Secondary | ICD-10-CM

## 2022-05-10 NOTE — Patient Instructions (Signed)
Visit Information  Following are the goals we discussed today:  Current Barriers:  Unable to independently afford treatment regimen Suboptimal therapeutic regimen for T2DM  Pharmacist Clinical Goal(s):  patient will verbalize ability to afford treatment regimen adhere to plan to optimize therapeutic regimen for T2DM as evidenced by report of adherence to recommended medication management changes through collaboration with PharmD and provider.   Interventions: 1:1 collaboration with Sharion Balloon, FNP regarding development and update of comprehensive plan of care as evidenced by provider attestation and co-signature Inter-disciplinary care team collaboration (see longitudinal plan of care) Comprehensive medication review performed; medication list updated in electronic medical record  Diabetes: goal progressing-ON track-->yes Controlled--a1c 6.4-->6.1%; current treatment: VICTOZA 1.'2mg'$  daily;  Continue Victoza 1.'2mg'$  daily and will titrate to 1.'8mg'$  as tolerated/needed Patient was previously only taking 0.'6mg'$  daily Tolerating well-->will continue victoza 1.'2mg'$  daily 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 from 83 in 05/2021)--consider SGLT2 Denies personal and family history of Medullary thyroid cancer (MTC) Would like to transition to once weekly GLP1 if patient would like to transition at some point Enrolled in [employer] patient assistance program April 2023 Current glucose readings: fasting glucose: <130, post prandial glucose: <180 traditional glucometer called in to advanced diabetes supply for $0 copay (via parachute portal) Denies hypoglycemic/hyperglycemic symptoms Discussed meal planning options and Plate method for healthy eating Avoid sugary drinks and desserts Incorporate balanced protein, non starchy veggies, 1 serving of carbohydrate with each meal Increase water intake Increase physical activity as able Current exercise: n/a Educated on medications-purpose and side  effects; diet-heart healthy/plate method Recommended heart healthy/plate method--will help with hyperlipidemia as well Lipid Panel     Component Value Date/Time   CHOL 216 (H) 03/05/2022 0925   CHOL 203 (H) 11/14/2012 0932   TRIG 130 03/05/2022 0925   TRIG 143 11/14/2012 0932   HDL 57 03/05/2022 0925   HDL 42 11/14/2012 0932   CHOLHDL 3.8 03/05/2022 0925   CHOLHDL 3.0 06/16/2018 1254   VLDL 36 06/16/2018 1254   LDLCALC 136 (H) 03/05/2022 0925   LDLCALC 132 (H) 11/14/2012 0932   LABVLDL 23 03/05/2022 0925   Livalo called in to the drug store; not consistent with taking; patient does not want an injection; will continue to address Difficulties with statin myopathy Enrolled in [employer] grant for patient assistance until 07/2022  Assessed patient finances. Will re-enroll in [employer] patient assistance program for victoza   Patient Goals/Self-Care Activities patient will:  - take medications as prescribed as evidenced by patient report and record review check glucose DAILY (FASTING), document, and provide at future appointments collaborate with provider on medication access solutions engage in dietary modifications by FOLLOWING HEALTHY PLATE METHOD   Plan: Telephone follow up appointment with care management team member scheduled for:  1 month  Signature Holly Price, PharmD, BCPS, BCACP Clinical Pharmacist, [place]  II  T [phone]   Please call the care guide team at [phone] if you need to cancel or reschedule your appointment.   The patient verbalized understanding of instructions, educational materials, and care plan provided today and DECLINED offer to receive copy of patient instructions, educational materials, and care plan.

## 2022-05-10 NOTE — Progress Notes (Signed)
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 from 83 in 05/2021)--consider SGLT2 Denies personal and family history of Medullary thyroid cancer (MTC) Would like to transition to once weekly GLP1 if patient would like to transition at some point Enrolled in [employer] patient assistance program April 2023 Current glucose readings: fasting glucose: <130, post prandial glucose: <180 traditional glucometer called in to advanced diabetes supply for $0 copay (via parachute portal) Denies hypoglycemic/hyperglycemic symptoms Discussed meal planning options and Plate method for healthy eating Avoid sugary drinks and desserts Incorporate balanced protein, non starchy veggies, 1 serving of carbohydrate with each meal Increase water intake Increase physical activity as able Current exercise: n/a Educated on medications-purpose and side effects; diet-heart healthy/plate method Recommended heart healthy/plate method--will help with hyperlipidemia as well Lipid Panel     Component Value Date/Time   CHOL 216 (H) 03/05/2022 0925   CHOL 203 (H) 11/14/2012 0932   TRIG 130 03/05/2022 0925   TRIG 143 11/14/2012 0932   HDL 57 03/05/2022 0925   HDL 42 11/14/2012 0932   CHOLHDL 3.8 03/05/2022 0925   CHOLHDL 3.0 06/16/2018 1254   VLDL 36 06/16/2018 1254   LDLCALC 136 (H) 03/05/2022 0925   LDLCALC 132 (H) 11/14/2012 0932   LABVLDL 23 03/05/2022 0925   Livalo called in to the drug store; not consistent with taking; patient does not want an injection; will continue to address Difficulties with statin  myopathy Enrolled in [employer] grant for patient assistance until 07/2022  Assessed patient finances. Will re-enroll in [employer] patient assistance program for victoza   Patient Goals/Self-Care Activities patient will:  - take medications as prescribed as evidenced by patient report and record review check glucose DAILY (FASTING), document, and provide at future appointments collaborate with provider on medication access solutions engage in dietary modifications by FOLLOWING HEALTHY PLATE METHOD   Subjective: Holly Price 71 y.71 year old female who is a primary patient of HawksSharion Balloon.  The patient was referred to the Chronic Care Management team for assistance with care management needs subsequent to provider initiation of CCM services and plan of care.    Engaged with patient by telephone for follow up visit in response to provider referral for CCM services.   Objective:  LABS:  Lab Results  Component Value Date   CREATININE 1.05 (H) 03/05/2022   CREATININE 1.03 (H) 09/14/2021   CREATININE 0.78 06/16/2021     Lab Results  Component Value Date   HGBA1C 6.1 (H) 03/05/2022         Component Value Date/Time   CHOL 216 (H) 03/05/2022 0925   CHOL 203 (H) 11/14/2012 0932   TRIG 130 03/05/2022 0925   TRIG 143 11/14/2012 0932   HDL 57 03/05/2022 0925   HDL 42 11/14/2012 0932   CHOLHDL 3.8 03/05/2022 0925   CHOLHDL 3.0 06/16/2018 1254   VLDL 36 06/16/2018 1254   LDLCALC 136 (H) 03/05/2022 0925   LDLCALC 132 (H) 11/14/2012 0932     Clinical ASCVD: No   The 10-year ASCVD risk score (Arnett DK, et al., 2019) is: 16.6%   Values used to calculate the score:  Age: 9 years     Sex: Female     Is Non-Hispanic African American: No     Diabetic: Yes     Tobacco smoker: No     Systolic Blood Pressure: 937 mmHg     Is BP treated: Yes     HDL Cholesterol: 57 mg/dL     Total Cholesterol: 216 mg/dL    Other: (CHADS2VASc if Afib, PHQ9 if depression,  MMRC or CAT for COPD, ACT, DEXA)    BP Readings from Last 3 Encounters:  03/05/22 (!) 112/57  11/30/21 (!) 130/56  09/14/21 140/69      SDOH:  (Social Determinants of Health) assessments and interventions performed:    No Known Allergies  Medications Reviewed Today     Reviewed by Sharion Balloon, FNP (Family Nurse Practitioner) on 03/05/22 at [phone]  Med List Status: <None>   Medication Order Taking? Sig Documenting Provider Last Dose Status Informant  ACCU-CHEK AVIVA PLUS test strip [id number] Yes TWICE DAILY Sharion Balloon, FNP Taking Active Self  acetaminophen (TYLENOL) 500 MG tablet [id number] Yes Take 1,000 mg by mouth every 6 (six) hours as needed for moderate pain or headache. [provider] Taking Active Self  amLODipine (NORVASC) 5 MG tablet [id number] Yes Take 1 tablet (5 mg total) by mouth daily. Evelina Dun A, FNP Taking Active   atenolol (TENORMIN) 50 MG tablet [id number] Yes TAKE ONE (1) TABLET BY MOUTH EVERY DAY Hawks, Christy A, FNP Taking Active   baclofen (LIORESAL) 10 MG tablet [id number] Yes TAKE 1/2 TABLET BY MOUTH 3 TIMES DAILY Hawks, Christy A, FNP Taking Active   Blood Glucose Monitoring Suppl (ACCU-CHEK AVIVA PLUS) W/DEVICE KIT [id number] Yes 1 Device by Does not apply route 2 (two) times daily. Test blood sugar twice daily.dx.250.0 Evelina Dun A, FNP Taking Active Self  Calcium Citrate-Vitamin D (CALCIUM + D PO) [id number] Yes Take 1 tablet by mouth daily. [provider] Taking Active Self  diclofenac Sodium (VOLTAREN) 1 % GEL [id number] Yes Apply 2 g topically 4 (four) times daily. Sharion Balloon, FNP Taking Active   donepezil (ARICEPT) 5 MG tablet [id number] Yes Take 1 tablet (5 mg total) by mouth at bedtime. Sharion Balloon, FNP Taking Active   EPINEPHrine 0.3 mg/0.3 mL IJ SOAJ injection [id number] Yes Inject 0.3 mLs (0.3 mg total) into the muscle as needed for anaphylaxis. Sharion Balloon, FNP Taking Active Self  liraglutide (VICTOZA) 18  MG/3ML SOPN [id number] Yes Inject 1.8 mg into the skin every evening. Sharion Balloon, FNP Taking Active   lisinopril-hydrochlorothiazide (ZESTORETIC) 20-12.5 MG tablet [id number] Yes Take 1 tablet by mouth daily. Evelina Dun A, FNP Taking Active   meloxicam (MOBIC) 15 MG tablet [id number] Yes TAKE ONE (1) TABLET BY MOUTH EVERY DAY Hawks, Christy A, FNP Taking Active   memantine (NAMENDA) 5 MG tablet [id number] Yes Take 1 tablet (5 mg total) by mouth 2 (two) times daily. Sharion Balloon, FNP Taking Active   NOVOTWIST 32G X 5 MM MISC [id number] Yes USE AS DIRECTED Sharion Balloon, FNP Taking Active Self  Pitavastatin Calcium (LIVALO) 2 MG TABS [id number] Yes Take 1 tablet (2 mg total) by mouth daily. Sharion Balloon, FNP Taking Active   RESTASIS 0.05 % ophthalmic emulsion [id number] Yes Place 1 drop into both eyes 2 (two) times daily as needed (dry eyes).  [provider] Taking Active Self  traMADol (ULTRAM) 50 MG tablet [id number] Yes TAKE ONE TABLET BY MOUTH EVERY EIGHT HOURS AS NEEDED  Sharion Balloon, FNP Taking Active   zinc gluconate 50 MG tablet [id number] Yes Take 50 mg by mouth daily. [provider] Taking Active Self              Goals Addressed               This Visit's Progress     Patient Stated     t2dm, hld pharmd goal (pt-stated)        Current Barriers:  Unable to independently afford treatment regimen Suboptimal therapeutic regimen for T2DM  Pharmacist Clinical Goal(s):  patient will verbalize ability to afford treatment regimen adhere to plan to optimize therapeutic regimen for T2DM as evidenced by report of adherence to recommended medication management changes through collaboration with PharmD and provider.   Interventions: 1:1 collaboration with Sharion Balloon, FNP regarding development and update of comprehensive plan of care as evidenced by provider attestation and co-signature Inter-disciplinary care team collaboration (see  longitudinal plan of care) Comprehensive medication review performed; medication list updated in electronic medical record  Diabetes: goal progressing-ON track-->yes Controlled--a1c 6.4-->6.1%; current treatment: VICTOZA 1.50m daily;  Continue Victoza 1.261mdaily and will titrate to 1.72m85ms tolerated/needed Patient was previously only taking 0.6mg64mily Tolerating well-->will continue victoza 1.2mg 70mly 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 from 83 in 05/2021)--consider SGLT2 Denies personal and family history of Medullary thyroid cancer (MTC) Would like to transition to once weekly GLP1 if patient would like to transition at some point Enrolled in [employer] patient assistance program April 2023 Current glucose readings: fasting glucose: <130, post prandial glucose: <180 traditional glucometer called in to advanced diabetes supply for $0 copay (via parachute portal) Denies hypoglycemic/hyperglycemic symptoms Discussed meal planning options and Plate method for healthy eating Avoid sugary drinks and desserts Incorporate balanced protein, non starchy veggies, 1 serving of carbohydrate with each meal Increase water intake Increase physical activity as able Current exercise: n/a Educated on medications-purpose and side effects; diet-heart healthy/plate method Recommended heart healthy/plate method--will help with hyperlipidemia as well Lipid Panel     Component Value Date/Time   CHOL 216 (H) 03/05/2022 0925   CHOL 203 (H) 11/14/2012 0932   TRIG 130 03/05/2022 0925   TRIG 143 11/14/2012 0932   HDL 57 03/05/2022 0925   HDL 42 11/14/2012 0932   CHOLHDL 3.8 03/05/2022 0925   CHOLHDL 3.0 06/16/2018 1254   VLDL 36 06/16/2018 1254   LDLCALC 136 (H) 03/05/2022 0925   LDLCALC 132 (H) 11/14/2012 0932   LABVLDL 23 03/05/2022 0925  Livalo called in to the drug store; not consistent with taking; patient does not want an injection; will continue to address Difficulties with statin  myopathy Enrolled in [employer] grant for patient assistance until 07/2022  Assessed patient finances. Will re-enroll in [employer] patient assistance program for victoza   Patient Goals/Self-Care Activities patient will:  - take medications as prescribed as evidenced by patient report and record review check glucose DAILY (FASTING), document, and provide at future appointments collaborate with provider on medication access solutions engage in dietary modifications by FOLLOWING HEALTHY PLATE METHOD         Plan: Telephone follow up appointment with care management team member scheduled for:  05/2022     JulieRegina EckrmD, BCPS, BCACPOuachitaical Pharmacist, [place] T [phone]

## 2022-05-11 ENCOUNTER — Ambulatory Visit (INDEPENDENT_AMBULATORY_CARE_PROVIDER_SITE_OTHER): Payer: Medicare Other

## 2022-05-11 VITALS — Ht 64.0 in | Wt 152.0 lb

## 2022-05-11 DIAGNOSIS — Z0100 Encounter for examination of eyes and vision without abnormal findings: Secondary | ICD-10-CM

## 2022-05-11 DIAGNOSIS — Z0000 Encounter for general adult medical examination without abnormal findings: Secondary | ICD-10-CM | POA: Diagnosis not present

## 2022-05-11 NOTE — Progress Notes (Signed)
Subjective:   Holly Price is a 71 y.o. female who presents for Medicare Annual (Subsequent) preventive examination. I connected with  Nehemiah Settle on 05/11/22 by a audio enabled telemedicine application and verified that I am speaking with the correct person using two identifiers.  Patient Location: Home  Provider Location: Home Office  I discussed the limitations of evaluation and management by telemedicine. The patient expressed understanding and agreed to proceed.  Review of Systems     Cardiac Risk Factors include: advanced age (>38mn, >>8women);diabetes mellitus;hypertension     Objective:    Today's Vitals   05/11/22 1513  Weight: 152 lb (68.9 kg)  Height: _0  (1.626 m)   Body mass index is 26.09 kg/m.     05/10/2021    4:31 PM 05/04/2020    2:55 PM 10/14/2019    7:30 AM 10/07/2018    9:41 AM 07/14/2018   10:30 AM 06/24/2018    9:52 AM 06/24/2015    9:35 AM  Advanced Directives  Does Patient Have a Medical Advance Directive? _1  No No  Would patient like information on creating a medical advance directive? No - Patient declined No - Patient declined Yes (MAU/Ambulatory/Procedural Areas - Information given) No - Patient declined No - Patient declined      Current Medications (verified) Outpatient Encounter Medications as of 05/11/2022  Medication Sig   ACCU-CHEK AVIVA PLUS test strip TWICE DAILY   acetaminophen (TYLENOL) 500 MG tablet Take 1,000 mg by mouth every 6 (six) hours as needed for moderate pain or headache.   amLODipine (NORVASC) 5 MG tablet Take 1 tablet (5 mg total) by mouth daily.   atenolol (TENORMIN) 50 MG tablet TAKE ONE (1) TABLET BY MOUTH EVERY DAY   baclofen (LIORESAL) 10 MG tablet TAKE 1/2 TABLET BY MOUTH 3 TIMES DAILY   blood glucose meter kit and supplies Dispense based on patient and insurance preference. Use up to four times daily as directed. (FOR ICD-10 E10.9, E11.9).   Blood Glucose Monitoring Suppl (ACCU-CHEK AVIVA  PLUS) W/DEVICE KIT 1 Device by Does not apply route 2 (two) times daily. Test blood sugar twice daily.dx.250.0   Calcium Citrate-Vitamin D (CALCIUM + D PO) Take 1 tablet by mouth daily.   diclofenac Sodium (VOLTAREN) 1 % GEL Apply 2 g topically 4 (four) times daily.   donepezil (ARICEPT) 5 MG tablet Take 1 tablet (5 mg total) by mouth at bedtime.   EPINEPHrine 0.3 mg/0.3 mL IJ SOAJ injection Inject 0.3 mLs (0.3 mg total) into the muscle as needed for anaphylaxis.   liraglutide (VICTOZA) 18 MG/3ML SOPN Inject 1.8 mg into the skin every evening.   lisinopril-hydrochlorothiazide (ZESTORETIC) 20-12.5 MG tablet Take 1 tablet by mouth daily.   meloxicam (MOBIC) 15 MG tablet TAKE ONE (1) TABLET BY MOUTH EVERY DAY   memantine (NAMENDA) 5 MG tablet Take 1 tablet (5 mg total) by mouth 2 (two) times daily.   NOVOTWIST 32G X 5 MM MISC USE AS DIRECTED   Pitavastatin Calcium (LIVALO) 2 MG TABS Take 1 tablet (2 mg total) by mouth daily.   RESTASIS 0.05 % ophthalmic emulsion Place 1 drop into both eyes 2 (two) times daily as needed (dry eyes).    zinc gluconate 50 MG tablet Take 50 mg by mouth daily.   No facility-administered encounter medications on file as of 05/11/2022.    Allergies (verified) Patient has no known allergies.   History: Past Medical History:  Diagnosis Date  Anxiety    COPD (chronic obstructive pulmonary disease) (HCC)    DDD (degenerative disc disease)    Depression    Diabetes mellitus without complication (HCC)    DVT (deep venous thrombosis) ([facility]) 2017   stopped Xarelto in Feb 2020   Fibromyalgia    GERD (gastroesophageal reflux disease)    Hypertension    Vitamin D deficiency disease    Past Surgical History:  Procedure Laterality Date   ABDOMINAL HYSTERECTOMY     CHOLECYSTECTOMY     COLONOSCOPY N/A 10/14/2019   Procedure: COLONOSCOPY;  Surgeon: Daneil Dolin, MD;  Location: [facility];  Service: Endoscopy;  Laterality: N/A;  8:30am   FOOT SURGERY Bilateral     HAND SURGERY Bilateral    POLYPECTOMY  10/14/2019   Procedure: POLYPECTOMY;  Surgeon: Daneil Dolin, MD;  Location: [facility];  Service: Endoscopy;;   SPINE SURGERY     TONSILLECTOMY     Family History  Problem Relation Age of Onset   Cancer Mother    Heart attack Father    Hypertension Son    Heart attack Sister    Colon cancer Neg Hx    Social History   Socioeconomic History   Marital status: Married    Spouse name: Denyse Amass    Number of children: 1   Years of education: 11th   Highest education level: 11th grade  Occupational History   Occupation: Retired     Comment: CNA  Tobacco Use   Smoking status: Former    Packs/day: 0.50    Years: 10.00    Total pack years: 5.00    Types: Cigarettes    Quit date: 06/22/2012    Years since quitting: 9.8   Smokeless tobacco: Never  Vaping Use   Vaping Use: Never used  Substance and Sexual Activity   Alcohol use: Not Currently   Drug use: Not Currently   Sexual activity: Not Currently  Other Topics Concern   Not on file  Social History Narrative   Lives with husband of over 51 years   Son lives on the same street.   Social Determinants of Health   Financial Resource Strain: Low Risk  (05/11/2022)   Overall Financial Resource Strain (CARDIA)    Difficulty of Paying Living Expenses: Not hard at all  Food Insecurity: No Food Insecurity (05/11/2022)   Hunger Vital Sign    Worried About Running Out of Food in the Last Year: Never true    Ran Out of Food in the Last Year: Never true  Transportation Needs: No Transportation Needs (05/11/2022)   PRAPARE - Hydrologist (Medical): No    Lack of Transportation (Non-Medical): No  Physical Activity: Insufficiently Active (05/11/2022)   Exercise Vital Sign    Days of Exercise per Week: 3 days    Minutes of Exercise per Session: 30 min  Stress: No Stress Concern Present (05/11/2022)   [place]    Feeling of Stress : Not at all  Social Connections: Moderately Integrated (05/11/2022)   Social Connection and Isolation Panel [NHANES]    Frequency of Communication with Friends and Family: More than three times a week    Frequency of Social Gatherings with Friends and Family: More than three times a week    Attends Religious Services: More than 4 times per year    Active Member of [employer] or Organizations: No    Attends Club or  Organization Meetings: Never    Marital Status: Married    Tobacco Counseling Counseling given: Not Answered   Clinical Intake:  Pre-visit preparation completed: Yes  Pain : No/denies pain     Nutritional Risks: None Diabetes: No  How often do you need to have someone help you when you read instructions, pamphlets, or other written materials from your doctor or pharmacy?: 1 - Never  Diabetic?yes Nutrition Risk Assessment:  Has the patient had any N/V/D within the last 2 months?  No  Does the patient have any non-healing wounds?  No  Has the patient had any unintentional weight loss or weight gain?  No   Diabetes:  Is the patient diabetic?  Yes  If diabetic, was a CBG obtained today?  No  Did the patient bring in their glucometer from home?  No  How often do you monitor your CBG's? Daily .   Financial Strains and Diabetes Management:  Are you having any financial strains with the device, your supplies or your medication? No .  Does the patient want to be seen by Chronic Care Management for management of their diabetes?  No  Would the patient like to be referred to a Nutritionist or for Diabetic Management?  No   Diabetic Exams:  Diabetic Eye Exam: Overdue for diabetic eye exam. Pt has been advised about the importance in completing this exam. Patient advised to call and schedule an eye exam. Diabetic Foot Exam: Overdue, Pt has been advised about the importance in completing this exam. Pt is scheduled for diabetic foot exam on  next office visit .   Interpreter Needed?: No  Information entered by :: Jadene Pierini, LPN   Activities of Daily Living    05/11/2022    3:16 PM  In your present state of health, do you have any difficulty performing the following activities:  Hearing? 0  Vision? 0  Difficulty concentrating or making decisions? 0  Walking or climbing stairs? 0  Dressing or bathing? 0  Doing errands, shopping? 0  Preparing Food and eating ? N  Using the Toilet? N  In the past six months, have you accidently leaked urine? N  Do you have problems with loss of bowel control? N  Managing your Medications? N  Managing your Finances? N  Housekeeping or managing your Housekeeping? N    Patient Care Team: Sharion Balloon, FNP as PCP - General (Nurse Practitioner) Lavera Guise, [facility] as Pharmacist (Family Medicine)  Indicate any recent Medical Services you may have received from other than [facility] providers in the past year (date may be approximate).     Assessment:   This is a routine wellness examination for Adalie.  Hearing/Vision screen Vision Screening - Comments:: Referral 05/11/2022  Dietary issues and exercise activities discussed: Current Exercise Habits: Home exercise routine, Type of exercise: walking, Time (Minutes): 30, Frequency (Times/Week): 3, Weekly Exercise (Minutes/Week): 90, Intensity: Mild, Exercise limited by: None identified   Goals Addressed             This Visit's Progress    Exercise 3x per week (30 min per time)         Depression Screen    03/05/2022    8:57 AM 11/30/2021   11:09 AM 05/10/2021    4:29 PM 03/14/2021   10:32 AM 12/01/2020    9:00 AM 05/04/2020    3:02 PM 04/01/2020    9:22 AM  PHQ 2/9 Scores  PHQ - 2 Score 0 0 0  0 0 0 0  PHQ- 9 Score 0  0 0 0      Fall Risk    05/11/2022    3:14 PM 11/30/2021   11:09 AM 05/10/2021    4:31 PM 03/14/2021   10:32 AM 12/01/2020    9:00 AM  Fall Risk   Falls in the past year? 0 0 0 0 0  Number falls in past  yr: 0  0    Injury with Fall? 0  0    Risk for fall due to : No Fall Risks  Orthopedic patient;Medication side effect    Follow up Falls prevention discussed  Falls prevention discussed      FALL RISK PREVENTION PERTAINING TO THE HOME:  Any stairs in or around the home? No  If so, are there any without handrails? No  Home free of loose throw rugs in walkways, pet beds, electrical cords, etc? Yes  Adequate lighting in your home to reduce risk of falls? Yes   ASSISTIVE DEVICES UTILIZED TO PREVENT FALLS:  Life alert? No  Use of a cane, walker or w/c? No  Grab bars in the bathroom? Yes  Shower chair or bench in shower? Yes  Elevated toilet seat or a handicapped toilet? No       05/04/2019    2:36 PM  MMSE - Mini Mental State Exam  Orientation to time 3  Orientation to Place 5  Registration 3  Attention/ Calculation 5  Recall 0  Language- name 2 objects 2  Language- repeat 1  Language- follow 3 step command 3  Language- read & follow direction 1  Write a sentence 1  Copy design 1  Total score 25        05/11/2022    3:16 PM 05/10/2021    4:36 PM 05/04/2020    2:59 PM  6CIT Screen  What Year? 0 points 0 points 0 points  What month? 0 points 0 points 0 points  What time? 0 points 0 points 0 points  Count back from 20 0 points 0 points 0 points  Months in reverse 0 points 2 points 2 points  Repeat phrase 2 points 4 points 0 points  Total Score 2 points 6 points 2 points    Immunizations Immunization History  Administered Date(s) Administered   Fluad Quad(high Dose 65+) 04/01/2020, 03/14/2021, 03/05/2022   Influenza, High Dose Seasonal PF 06/16/2018   Influenza,inj,Quad PF,6+ Mos 02/25/2013, 02/24/2014, 04/15/2015   Moderna Sars-Covid-2 Vaccination 07/22/2019, 08/19/2019   Pneumococcal Conjugate-13 02/15/2014   Pneumococcal Polysaccharide-23 04/29/2012, 06/16/2018   Tdap 04/29/2012   Zoster Recombinat (Shingrix) 12/01/2020, 06/16/2021   Zoster, Live 11/02/2013     TDAP status: Up to date  Flu Vaccine status: Up to date  Pneumococcal vaccine status: Up to date  Covid-19 vaccine status: Completed vaccines  Qualifies for Shingles Vaccine? Yes   Zostavax completed Yes   Shingrix Completed?: Yes  Screening Tests Health Maintenance  Topic Date Due   OPHTHALMOLOGY EXAM  08/29/2016   COVID-19 Vaccine (3 - Moderna risk series) 09/16/2019   DTaP/Tdap/Td (2 - Td or Tdap) 04/29/2022   FOOT EXAM  06/16/2022   MAMMOGRAM  06/26/2022   HEMOGLOBIN A1C  09/04/2022   Diabetic kidney evaluation - eGFR measurement  03/06/2023   Diabetic kidney evaluation - Urine ACR  03/06/2023   Medicare Annual Wellness (AWV)  05/12/2023   DEXA SCAN  06/24/2023   COLONOSCOPY (Pts 45-30yr Insurance coverage will need to be confirmed)  10/13/2029  Pneumonia Vaccine 86+ Years old  Completed   INFLUENZA VACCINE  Completed   Hepatitis C Screening  Completed   Zoster Vaccines- Shingrix  Completed   HPV VACCINES  Aged Out    Health Maintenance  Health Maintenance Due  Topic Date Due   OPHTHALMOLOGY EXAM  08/29/2016   COVID-19 Vaccine (3 - Moderna risk series) 09/16/2019   DTaP/Tdap/Td (2 - Td or Tdap) 04/29/2022    Colorectal cancer screening: Type of screening: Colonoscopy. Completed 10/14/2019. Repeat every 10 years  Mammogram status: Completed 06/23/2018. Repeat every year  Bone Density status: Completed 06/26/2021. Results reflect: Bone density results: OSTEOPENIA. Repeat every 5 years.  Lung Cancer Screening: (Low Dose CT Chest recommended if Age 26-80 years, 30 pack-year currently smoking OR have quit w/in 15years.) does not qualify.   Lung Cancer Screening Referral: n/a  Additional Screening:  Hepatitis C Screening: does not qualify;   Vision Screening: Recommended annual ophthalmology exams for early detection of glaucoma and other disorders of the eye. Is the patient up to date with their annual eye exam?  No  Who is the provider or what is the  name of the office in which the patient attends annual eye exams? None referral 05/11/2022 If pt is not established with a provider, would they like to be referred to a provider to establish care? No .   Dental Screening: Recommended annual dental exams for proper oral hygiene  Community Resource Referral / Chronic Care Management: CRR required this visit?  No   CCM required this visit?  No      Plan:     I have personally reviewed and noted the following in the patient's chart:   Medical and social history Use of alcohol, tobacco or illicit drugs  Current medications and supplements including opioid prescriptions. Patient is not currently taking opioid prescriptions. Functional ability and status Nutritional status Physical activity Advanced directives List of other physicians Hospitalizations, surgeries, and ER visits in previous 12 months Vitals Screenings to include cognitive, depression, and falls Referrals and appointments  In addition, I have reviewed and discussed with patient certain preventive protocols, quality metrics, and best practice recommendations. A written personalized care plan for preventive services as well as general preventive health recommendations were provided to patient.     Daphane Shepherd, LPN   [id number]   Nurse Notes: Referral for eye exam

## 2022-05-11 NOTE — Patient Instructions (Signed)
Holly Price , Thank you for taking time to come for your Medicare Wellness Visit. I appreciate your ongoing commitment to your health goals. Please review the following plan we discussed and let me know if I can assist you in the future.   These are the goals we discussed:  Goals       AWV      05/04/2020 AWV Goal: Diabetes Management  Patient will maintain an A1C level below 8.0 Patient will not develop any diabetic foot complications Patient will not experience any hypoglycemic episodes over the next 3 months Patient will notify our office of any CBG readings outside of the provider recommended range by calling [phone] Patient will adhere to provider recommendations for diabetes management  Patient Self Management Activities take all medications as prescribed and report any negative side effects monitor and record blood sugar readings as directed adhere to a low carbohydrate diet that incorporates lean proteins, vegetables, whole grains, low glycemic fruits check feet daily noting any sores, cracks, injuries, or callous formations see PCP or podiatrist if she notices any changes in her legs, feet, or toenails Patient will visit PCP and have an A1C level checked every 3 to 6 months as directed  have a yearly eye exam to monitor for vascular changes associated with diabetes and will request that the report be sent to her pcp.  consult with her PCP regarding any changes in her health or new or worsening symptoms   05/04/2020 AWV Goal: Exercise for General Health  Patient will verbalize understanding of the benefits of increased physical activity: Exercising regularly is important. It will improve your overall fitness, flexibility, and endurance. Regular exercise also will improve your overall health. It can help you control your weight, reduce stress, and improve your bone density. Over the next year, patient will increase physical activity as tolerated with a goal of at least 150  minutes of moderate physical activity per week.  You can tell that you are exercising at a moderate intensity if your heart starts beating faster and you start breathing faster but can still hold a conversation. Moderate-intensity exercise ideas include: Walking 1 mile (1.6 km) in about 15 minutes Biking Hiking Golfing Dancing Water aerobics Patient will verbalize understanding of everyday activities that increase physical activity by providing examples like the following: Yard work, such as: Sales promotion account executive Gardening Washing windows or floors Patient will be able to explain general safety guidelines for exercising:  Before you start a new exercise program, talk with your health care provider. Do not exercise so much that you hurt yourself, feel dizzy, or get very short of breath. Wear comfortable clothes and wear shoes with good support. Drink plenty of water while you exercise to prevent dehydration or heat stroke. Work out until your breathing and your heartbeat get faster.       Exercise 3x per week (30 min per time)      t2dm, hld pharmd goal (pt-stated)      Current Barriers:  Unable to independently afford treatment regimen Suboptimal therapeutic regimen for T2DM  Pharmacist Clinical Goal(s):  patient will verbalize ability to afford treatment regimen adhere to plan to optimize therapeutic regimen for T2DM as evidenced by report of adherence to recommended medication management changes through collaboration with PharmD and provider.   Interventions: 1:1 collaboration with Sharion Balloon, FNP regarding development and update of comprehensive plan of care as evidenced by  provider attestation and co-signature Inter-disciplinary care team collaboration (see longitudinal plan of care) Comprehensive medication review performed; medication list updated in electronic medical record  Diabetes:  goal progressing-ON track-->yes Controlled--a1c 6.4-->6.1%; current treatment: VICTOZA 1.'2mg'$  daily;  Continue Victoza 1.'2mg'$  daily and will titrate to 1.'8mg'$  as tolerated/needed Patient was previously only taking 0.'6mg'$  daily Tolerating well-->will continue victoza 1.'2mg'$  daily 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 from 83 in 05/2021)--consider SGLT2 Denies personal and family history of Medullary thyroid cancer (MTC) Would like to transition to once weekly GLP1 if patient would like to transition at some point Enrolled in [employer] patient assistance program April 2023 Current glucose readings: fasting glucose: <130, post prandial glucose: <180 traditional glucometer called in to advanced diabetes supply for $0 copay (via parach[employer] portal) Denies hypoglycemic/hyperglycemic symptoms Discussed meal planning options and Plate method for healthy eating Avoid sugary drinks and desserts Incorporate balanced protein, non starchy veggies, 1 serving of carbohydrate with each meal Increase water intake Increase physical activity as able Current exercise: n/a Educated on medications-purpose and side effects; diet-heart healthy/plate method Recommended heart healthy/plate method--will help with hyperlipidemia as well Lipid Panel     Component Value Date/Time   CHOL 216 (H) 03/05/2022 0925   CHOL 203 (H) 11/14/2012 0932   TRIG 130 03/05/2022 0925   TRIG 143 11/14/2012 0932   HDL 57 03/05/2022 0925   HDL 42 11/14/2012 0932   CHOLHDL 3.8 03/05/2022 0925   CHOLHDL 3.0 06/16/2018 1254   VLDL 36 06/16/2018 1254   LDLCALC 136 (H) 03/05/2022 0925   LDLCALC 132 (H) 11/14/2012 0932   LABVLDL 23 03/05/2022 0925  Livalo called in to the drug store; not consistent with taking; patient does not want an injection; will continue to address Difficulties with statin myopathy Enrolled in healthwell grant for patient assistance until 07/2022  Assessed patient finances. Will re-enroll in [employer] patient  assistance program for victoza   Patient Goals/Self-Care Activities patient will:  - take medications as prescribed as evidenced by patient report and record review check glucose DAILY (FASTING), document, and provide at future appointments collaborate with provider on medication access solutions engage in dietary modifications by FOLLOWING HEALTHY PLATE METHOD         This is a list of the screening recommended for you and due dates:  Health Maintenance  Topic Date Due   Eye exam for diabetics  08/29/2016   COVID-19 Vaccine (3 - Moderna risk series) 09/16/2019   DTaP/Tdap/Td vaccine (2 - Td or Tdap) 04/29/2022   Complete foot exam   06/16/2022   Mammogram  06/26/2022   Hemoglobin A1C  09/04/2022   Yearly kidney function blood test for diabetes  03/06/2023   Yearly kidney health urinalysis for diabetes  03/06/2023   Medicare Annual Wellness Visit  05/12/2023   DEXA scan (bone density measurement)  06/24/2023   Colon Cancer Screening  10/13/2029   Pneumonia Vaccine  Completed   Flu Shot  Completed   Hepatitis C Screening: USPSTF Recommendation to screen - Ages 18-79 yo.  Completed   Zoster (Shingles) Vaccine  Completed   HPV Vaccine  Aged Out    Advanced directives: Advance directive discussed with you today. I have provided a copy for you to complete at home and have notarized. Once this is complete please bring a copy in to our office so we can scan it into your chart.   Conditions/risks identified: Aim for 30 minutes of exercise or brisk walking, 6-8 glasses of water, and 5  servings of fruits and vegetables each day.   Next appointment: Follow up in one year for your annual wellness visit    Preventive Care 65 Years and Older, Female Preventive care refers to lifestyle choices and visits with your health care provider that can promote health and wellness. What does preventive care include? A yearly physical exam. This is also called an annual well check. Dental exams  once or twice a year. Routine eye exams. Ask your health care provider how often you should have your eyes checked. Personal lifestyle choices, including: Daily care of your teeth and gums. Regular physical activity. Eating a healthy diet. Avoiding tobacco and drug use. Limiting alcohol use. Practicing safe sex. Taking low-dose aspirin every day. Taking vitamin and mineral supplements as recommended by your health care provider. What happens during an annual well check? The services and screenings done by your health care provider during your annual well check will depend on your age, overall health, lifestyle risk factors, and family history of disease. Counseling  Your health care provider may ask you questions about your: Alcohol use. Tobacco use. Drug use. Emotional well-being. Home and relationship well-being. Sexual activity. Eating habits. History of falls. Memory and ability to understand (cognition). Work and work Statistician. Reproductive health. Screening  You may have the following tests or measurements: Height, weight, and BMI. Blood pressure. Lipid and cholesterol levels. These may be checked every 5 years, or more frequently if you are over 61 years old. Skin check. Lung cancer screening. You may have this screening every year starting at age 78 if you have a 30-pack-year history of smoking and currently smoke or have quit within the past 15 years. Fecal occult blood test (FOBT) of the stool. You may have this test every year starting at age 23. Flexible sigmoidoscopy or colonoscopy. You may have a sigmoidoscopy every 5 years or a colonoscopy every 10 years starting at age 33. Hepatitis C blood test. Hepatitis B blood test. Sexually transmitted disease (STD) testing. Diabetes screening. This is done by checking your blood sugar (glucose) after you have not eaten for a while (fasting). You may have this done every 1-3 years. Bone density scan. This is done to  screen for osteoporosis. You may have this done starting at age 70. Mammogram. This may be done every 1-2 years. Talk to your health care provider about how often you should have regular mammograms. Talk with your health care provider about your test results, treatment options, and if necessary, the need for more tests. Vaccines  Your health care provider may recommend certain vaccines, such as: Influenza vaccine. This is recommended every year. Tetanus, diphtheria, and acellular pertussis (Tdap, Td) vaccine. You may need a Td booster every 10 years. Zoster vaccine. You may need this after age 26. Pneumococcal 13-valent conjugate (PCV13) vaccine. One dose is recommended after age 42. Pneumococcal polysaccharide (PPSV23) vaccine. One dose is recommended after age 76. Talk to your health care provider about which screenings and vaccines you need and how often you need them. This information is not intended to replace advice given to you by your health care provider. Make sure you discuss any questions you have with your health care provider. Document Released: 06/10/2015 Document Revised: 02/01/2016 Document Reviewed: 03/15/2015 Elsevier Interactive Patient Education  2017 [employer] Prevention in the Home Falls can cause injuries. They can happen to people of all ages. There are many things you can do to make your home safe and to help prevent  falls. What can I do on the outside of my home? Regularly fix the edges of walkways and driveways and fix any cracks. Remove anything that might make you trip as you walk through a door, such as a raised step or threshold. Trim any bushes or trees on the path to your home. Use bright outdoor lighting. Clear any walking paths of anything that might make someone trip, such as rocks or tools. Regularly check to see if handrails are loose or broken. Make sure that both sides of any steps have handrails. Any raised decks and porches should have  guardrails on the edges. Have any leaves, snow, or ice cleared regularly. Use sand or salt on walking paths during winter. Clean up any spills in your garage right away. This includes oil or grease spills. What can I do in the bathroom? Use night lights. Install grab bars by the toilet and in the tub and shower. Do not use towel bars as grab bars. Use non-skid mats or decals in the tub or shower. If you need to sit down in the shower, use a plastic, non-slip stool. Keep the floor dry. Clean up any water that spills on the floor as soon as it happens. Remove soap buildup in the tub or shower regularly. Attach bath mats securely with double-sided non-slip rug tape. Do not have throw rugs and other things on the floor that can make you trip. What can I do in the bedroom? Use night lights. Make sure that you have a light by your bed that is easy to reach. Do not use any sheets or blankets that are too big for your bed. They should not hang down onto the floor. Have a firm chair that has side arms. You can use this for support while you get dressed. Do not have throw rugs and other things on the floor that can make you trip. What can I do in the kitchen? Clean up any spills right away. Avoid walking on wet floors. Keep items that you use a lot in easy-to-reach places. If you need to reach something above you, use a strong step stool that has a grab bar. Keep electrical cords out of the way. Do not use floor polish or wax that makes floors slippery. If you must use wax, use non-skid floor wax. Do not have throw rugs and other things on the floor that can make you trip. What can I do with my stairs? Do not leave any items on the stairs. Make sure that there are handrails on both sides of the stairs and use them. Fix handrails that are broken or loose. Make sure that handrails are as long as the stairways. Check any carpeting to make sure that it is firmly attached to the stairs. Fix any carpet  that is loose or worn. Avoid having throw rugs at the top or bottom of the stairs. If you do have throw rugs, attach them to the floor with carpet tape. Make sure that you have a light switch at the top of the stairs and the bottom of the stairs. If you do not have them, ask someone to add them for you. What else can I do to help prevent falls? Wear shoes that: Do not have high heels. Have rubber bottoms. Are comfortable and fit you well. Are closed at the toe. Do not wear sandals. If you use a stepladder: Make sure that it is fully opened. Do not climb a closed stepladder. Make sure that both  sides of the stepladder are locked into place. Ask someone to hold it for you, if possible. Clearly mark and make sure that you can see: Any grab bars or handrails. First and last steps. Where the edge of each step is. Use tools that help you move around (mobility aids) if they are needed. These include: Canes. Walkers. Scooters. Crutches. Turn on the lights when you go into a dark area. Replace any light bulbs as soon as they burn out. Set up your furniture so you have a clear path. Avoid moving your furniture around. If any of your floors are uneven, fix them. If there are any pets around you, be aware of where they are. Review your medicines with your doctor. Some medicines can make you feel dizzy. This can increase your chance of falling. Ask your doctor what other things that you can do to help prevent falls. This information is not intended to replace advice given to you by your health care provider. Make sure you discuss any questions you have with your health care provider. Document Released: 03/10/2009 Document Revised: 10/20/2015 Document Reviewed: 06/18/2014 Elsevier Interactive Patient Education  2017 [employer].

## 2022-05-31 ENCOUNTER — Other Ambulatory Visit: Payer: Self-pay | Admitting: Family

## 2022-05-31 DIAGNOSIS — Z1231 Encounter for screening mammogram for malignant neoplasm of breast: Secondary | ICD-10-CM

## 2022-06-11 ENCOUNTER — Telehealth: Payer: Self-pay | Admitting: Family

## 2022-06-11 MED ORDER — ROSUVASTATIN CALCIUM 5 MG PO TABS: 5.0000 mg | ORAL_TABLET | Freq: Every day | ORAL | 3 refills | Status: DC

## 2022-06-11 NOTE — Telephone Encounter (Signed)
Pt's husband has been made aware. States that pt is on her way to pick up medication now.

## 2022-06-11 NOTE — Addendum Note (Signed)
Addended by: Evelina Dun A on: 06/11/2022 03:19 PM   Modules accepted: Orders

## 2022-06-11 NOTE — Telephone Encounter (Signed)
Stop Livalo and start Crestor 5 mg. If muscle pain starts can take every other day.

## 2022-06-11 NOTE — Telephone Encounter (Signed)
Pt requests Crestor or Pravachol since she can not afford Livalo Please advise and send into the [facility]

## 2022-06-19 ENCOUNTER — Telehealth: Payer: Medicare Other | Admitting: Family

## 2022-06-19 ENCOUNTER — Ambulatory Visit (INDEPENDENT_AMBULATORY_CARE_PROVIDER_SITE_OTHER): Payer: Medicare Other | Admitting: Pharmacist

## 2022-06-19 DIAGNOSIS — E0800 Diabetes mellitus due to underlying condition with hyperosmolarity without nonketotic hyperglycemic-hyperosmolar coma (NKHHC): Secondary | ICD-10-CM

## 2022-06-19 DIAGNOSIS — G720 Drug-induced myopathy: Secondary | ICD-10-CM

## 2022-06-19 DIAGNOSIS — E1169 Type 2 diabetes mellitus with other specified complication: Secondary | ICD-10-CM

## 2022-06-19 NOTE — Telephone Encounter (Signed)
Patient has a bit of memory trouble at times (forgot to mention) I tried to call 4x today I think [employer] needs her tax info of SS income proof if you can reach out

## 2022-06-19 NOTE — Telephone Encounter (Signed)
Please check on status of patient assistance Victoza 1.'8mg'$  I think she was approved but didn't see on excel Thank you!

## 2022-06-19 NOTE — Telephone Encounter (Signed)
Appt made patient aware  °

## 2022-06-19 NOTE — Telephone Encounter (Signed)
Please reschedule visit with Almyra Free.

## 2022-06-21 ENCOUNTER — Telehealth: Payer: Self-pay

## 2022-06-21 NOTE — Telephone Encounter (Signed)
Left son a message requesting call back regarding documentation needed for his mothers PAP enrollment.  Call back [phone]

## 2022-06-21 NOTE — Progress Notes (Signed)
  Care Management   Follow Up Note   06/19/2022 Name: Holly Price MRN: [id number] DOB: 1950/08/02   Referred by: Sharion Balloon, FNP Reason for referral : Chronic Care Management and Diabetes  Patient was informed to bring patient assistance documents to PCP office for processing. She also has medication (victoza) here for pick up. Patient successfully transitioned to rosuvastatin daily.  Concerns for myopathy in the past. Her BP and glucose remains controlled.     SIGNATURE Regina Eck, PharmD, BCPS, BCACP Clinical Pharmacist, [place]  II  T [phone]

## 2022-06-21 NOTE — Telephone Encounter (Signed)
Pt son is returning your call ... He stated that the line got disconnected

## 2022-06-25 NOTE — Telephone Encounter (Signed)
Returned patients sons call.  He will email a image of his mothers SSA statement as soon as possible to submit to [employer] for her Victoza.

## 2022-06-27 DIAGNOSIS — E0800 Diabetes mellitus due to underlying condition with hyperosmolarity without nonketotic hyperglycemic-hyperosmolar coma (NKHHC): Secondary | ICD-10-CM

## 2022-06-27 DIAGNOSIS — E1169 Type 2 diabetes mellitus with other specified complication: Secondary | ICD-10-CM

## 2022-06-27 DIAGNOSIS — E785 Hyperlipidemia, unspecified: Secondary | ICD-10-CM

## 2022-07-03 ENCOUNTER — Ambulatory Visit (INDEPENDENT_AMBULATORY_CARE_PROVIDER_SITE_OTHER): Payer: Medicare Other | Admitting: Pharmacist

## 2022-07-03 VITALS — BP 148/53 | HR 60

## 2022-07-03 DIAGNOSIS — E785 Hyperlipidemia, unspecified: Secondary | ICD-10-CM | POA: Diagnosis not present

## 2022-07-03 DIAGNOSIS — Z7984 Long term (current) use of oral hypoglycemic drugs: Secondary | ICD-10-CM

## 2022-07-03 DIAGNOSIS — E0800 Diabetes mellitus due to underlying condition with hyperosmolarity without nonketotic hyperglycemic-hyperosmolar coma (NKHHC): Secondary | ICD-10-CM

## 2022-07-03 DIAGNOSIS — E1169 Type 2 diabetes mellitus with other specified complication: Secondary | ICD-10-CM | POA: Diagnosis not present

## 2022-07-03 NOTE — Progress Notes (Signed)
S:    Chief Complaint  Patient presents with   Diabetes   72 y.o. female who presents for diabetes evaluation, education, and management.  PMH is significant for HTN, T2DM with neuropathy, HLD, dementia, and h/o DVT (2017).  Patient was last seen by Primary Care Provider, Evelina Dun, on 03/05/2022. Last seen by pharmacist for CCM on 06/19/2022.  Today, patient arrives in good spirits and presents without any assistance. Patient denies muscle pain/myalgia since starting rosuvastatin. Last A1c 6.1% in October 2023. She brings in her SS income statement in today and says she has 4 Victoza pens left (~40 day supply).   Current diabetes medications include: Victoza 1.8 mg daily,  Current hypertension medications include: lisinopril-HCTZ 20-12.5 daily, amlodipine 5 mg daily, atenolol 50 mg daily  Current hyperlipidemia medications include: rosuvastatin 5 mg   Patient reports adherence to taking all medications as prescribed.   Insurance coverage: BCBS Medicare  Patient denies hypoglycemic events.  Reported home fasting blood sugars: does not check  Reported 2 hour post-meal/random blood sugars: all readings <180.  Patient denies nocturia (nighttime urination).  Patient denies neuropathy (nerve pain). Patient denies visual changes. Patient reports self foot exams.   O:  7 day average blood glucose: no meter with her today   Lab Results  Component Value Date   HGBA1C 6.1 (H) 03/05/2022   Vitals:   07/03/22 1119  BP: (!) 148/53  Pulse: 60    Lipid Panel     Component Value Date/Time   CHOL 216 (H) 03/05/2022 0925   CHOL 203 (H) 11/14/2012 0932   TRIG 130 03/05/2022 0925   TRIG 143 11/14/2012 0932   HDL 57 03/05/2022 0925   HDL 42 11/14/2012 0932   CHOLHDL 3.8 03/05/2022 0925   CHOLHDL 3.0 06/16/2018 1254   VLDL 36 06/16/2018 1254   LDLCALC 136 (H) 03/05/2022 0925   LDLCALC 132 (H) 11/14/2012 0932    Clinical Atherosclerotic Cardiovascular Disease (ASCVD): No   The 10-year ASCVD risk score (Arnett DK, et al., 2019) is: 27.3%   Values used to calculate the score:     Age: 61 years     Sex: Female     Is Non-Hispanic African American: No     Diabetic: Yes     Tobacco smoker: No     Systolic Blood Pressure: 123456 mmHg     Is BP treated: Yes     HDL Cholesterol: 57 mg/dL     Total Cholesterol: 216 mg/dL   A/P: Diabetes longstanding currently at goal based on A1c. Patient is able to verbalize appropriate hypoglycemia management plan. Medication adherence appears appropriate. -Continue Victoza 1.8 mg daily. Patient provided SS statement today to complete PAP. Instructed patient to call back when she has 1 pen left and we will help bridge her until PAP is approved.  -Patient educated on purpose, proper use, and potential adverse effects of Victoza.  -Extensively discussed pathophysiology of diabetes, recommended lifestyle interventions, dietary effects on blood sugar control.  -Counseled on s/sx of and management of hypoglycemia.  -Next A1c anticipated March 2024.   ASCVD risk - primary prevention in patient with diabetes. Last LDL is 136 in October 2023, not at goal of <70 mg/dL. Moderate intensity statin indicated. Denies myalgias since starting rosvuastatin -Continued rosuvastatin 5 mg daily.   Hypertension longstanding, currently at goal based on patient reported home BP readings (130s/80s mmHg). Clinic SBP slightly elevated (148/53 mmHg) Blood pressure goal of <130/80 mmHg. Medication adherence reported. UACR 22  in October 2023. -Continue amlodipine 5 mg daily, lisinopril-HCTZ 20-25 mg daily, and atenolol 50 mg daily  Written patient instructions provided. Patient verbalized understanding of treatment plan.  Total time in face to face counseling 20 minutes.    Follow-up:  AWV on 05/14/2023  Joseph Art, Pharm.D. PGY-2 Ambulatory Care Pharmacy Resident 07/03/2022 4:49 PM  Regina Eck, PharmD, BCPS, BCACP Clinical Pharmacist,  [place]  II  T [phone]

## 2022-07-09 ENCOUNTER — Other Ambulatory Visit: Payer: Self-pay | Admitting: Family

## 2022-07-10 ENCOUNTER — Other Ambulatory Visit: Payer: Self-pay

## 2022-07-30 ENCOUNTER — Telehealth: Payer: Self-pay | Admitting: Family

## 2022-07-30 MED ORDER — ACCU-CHEK AVIVA PLUS VI STRP: ORAL_STRIP | 3 refills | Status: DC

## 2022-07-30 NOTE — Telephone Encounter (Signed)
Pt aware refill sent to pharmacy 

## 2022-07-30 NOTE — Telephone Encounter (Signed)
  Prescription Request  07/30/2022  Is this a "Controlled Substance" medicine? no  Have you seen your PCP in the last 2 weeks? no  If YES, route message to pool  -  If NO, patient needs to be scheduled for appointment.  What is the name of the medication or equipment?  ACCU-CHEK AVIVA PLUS test strip   Have you contacted your pharmacy to request a refill? yes   Which pharmacy would you like this sent to?  [facility], [place] (Ph: [phone])    Patient notified that their request is being sent to the clinical staff for review and that they should receive a response within 2 business days.

## 2022-08-09 ENCOUNTER — Other Ambulatory Visit: Payer: Self-pay | Admitting: Family

## 2022-08-25 ENCOUNTER — Other Ambulatory Visit: Payer: Self-pay | Admitting: Family

## 2022-08-25 DIAGNOSIS — M549 Dorsalgia, unspecified: Secondary | ICD-10-CM

## 2022-08-31 ENCOUNTER — Other Ambulatory Visit: Payer: Self-pay | Admitting: Family

## 2022-09-04 ENCOUNTER — Ambulatory Visit
Admission: RE | Admit: 2022-09-04 | Discharge: 2022-09-04 | Disposition: A | Discharge status: Home / Self Care | Payer: Medicare Other | Source: Ambulatory Visit | Attending: Family | Admitting: Family

## 2022-09-04 DIAGNOSIS — Z1231 Encounter for screening mammogram for malignant neoplasm of breast: Secondary | ICD-10-CM

## 2022-09-17 ENCOUNTER — Telehealth: Payer: Self-pay | Admitting: Family

## 2022-09-17 ENCOUNTER — Other Ambulatory Visit: Payer: Self-pay | Admitting: Family

## 2022-09-17 DIAGNOSIS — E1159 Type 2 diabetes mellitus with other circulatory complications: Secondary | ICD-10-CM

## 2022-09-17 MED ORDER — LISINOPRIL-HYDROCHLOROTHIAZIDE 20-12.5 MG PO TABS: 1.0000 | ORAL_TABLET | Freq: Every day | ORAL | 1 refills | Status: DC

## 2022-09-17 NOTE — Telephone Encounter (Signed)
  Prescription Request  09/17/2022   What is the name of the medication or equipment? LISINOPRIL HCTZ and TRAMADCL HCI  Have you contacted your pharmacy to request a refill? YES  Which pharmacy would you like this sent to? [facility] [place]   Pt is scheduled to see PCP on 5/7 for med refill. Needs meds sent in to last her until her appt. She is completely out.

## 2022-09-17 NOTE — Telephone Encounter (Signed)
Linsiopril and HCTZ Prescription sent to pharmacy, will have to wait for Ultram refill at follow up as I can not refill these without an appointment.

## 2022-09-17 NOTE — Telephone Encounter (Signed)
Left message to call back  

## 2022-09-20 ENCOUNTER — Encounter: Payer: Self-pay | Admitting: *Deleted

## 2022-10-02 ENCOUNTER — Ambulatory Visit (INDEPENDENT_AMBULATORY_CARE_PROVIDER_SITE_OTHER): Payer: Medicare Other | Admitting: Family

## 2022-10-02 ENCOUNTER — Encounter: Payer: Self-pay | Admitting: Family

## 2022-10-02 VITALS — BP 141/64 | HR 62 | Temp 97.1°F | Ht 64.0 in | Wt 157.7 lb

## 2022-10-02 DIAGNOSIS — E559 Vitamin D deficiency, unspecified: Secondary | ICD-10-CM

## 2022-10-02 DIAGNOSIS — Z23 Encounter for immunization: Secondary | ICD-10-CM

## 2022-10-02 DIAGNOSIS — E1159 Type 2 diabetes mellitus with other circulatory complications: Secondary | ICD-10-CM

## 2022-10-02 DIAGNOSIS — Z79899 Other long term (current) drug therapy: Secondary | ICD-10-CM

## 2022-10-02 DIAGNOSIS — E1142 Type 2 diabetes mellitus with diabetic polyneuropathy: Secondary | ICD-10-CM

## 2022-10-02 DIAGNOSIS — E663 Overweight: Secondary | ICD-10-CM

## 2022-10-02 DIAGNOSIS — F0394 Unspecified dementia, unspecified severity, with anxiety: Secondary | ICD-10-CM

## 2022-10-02 DIAGNOSIS — M549 Dorsalgia, unspecified: Secondary | ICD-10-CM

## 2022-10-02 DIAGNOSIS — I152 Hypertension secondary to endocrine disorders: Secondary | ICD-10-CM | POA: Diagnosis not present

## 2022-10-02 DIAGNOSIS — E0800 Diabetes mellitus due to underlying condition with hyperosmolarity without nonketotic hyperglycemic-hyperosmolar coma (NKHHC): Secondary | ICD-10-CM | POA: Diagnosis not present

## 2022-10-02 DIAGNOSIS — G300 Alzheimer's disease with early onset: Secondary | ICD-10-CM

## 2022-10-02 DIAGNOSIS — E1169 Type 2 diabetes mellitus with other specified complication: Secondary | ICD-10-CM | POA: Diagnosis not present

## 2022-10-02 DIAGNOSIS — Z86718 Personal history of other venous thrombosis and embolism: Secondary | ICD-10-CM

## 2022-10-02 DIAGNOSIS — E785 Hyperlipidemia, unspecified: Secondary | ICD-10-CM

## 2022-10-02 LAB — CMP14+EGFR: Potassium: 4.5 mmol/L (ref 3.5–5.2)

## 2022-10-02 LAB — BAYER DCA HB A1C WAIVED: HB A1C (BAYER DCA - WAIVED): 5.8 % — ABNORMAL HIGH (ref 4.8–5.6)

## 2022-10-02 MED ORDER — TRAMADOL HCL 50 MG PO TABS
50.0000 mg | ORAL_TABLET | Freq: Three times a day (TID) | ORAL | 0 refills | Status: AC | PRN
Start: 2022-10-02 — End: 2022-10-07

## 2022-10-02 NOTE — Patient Instructions (Signed)
Hypertension, Adult High blood pressure (hypertension) is when the force of blood pumping through the arteries is too strong. The arteries are the blood vessels that carry blood from the heart throughout the body. Hypertension forces the heart to work harder to pump blood and may cause arteries to become narrow or stiff. Untreated or uncontrolled hypertension can lead to a heart attack, heart failure, a stroke, kidney disease, and other problems. A blood pressure reading consists of a higher number over a lower number. Ideally, your blood pressure should be below 120/80. The first ("top") number is called the systolic pressure. It is a measure of the pressure in your arteries as your heart beats. The second ("bottom") number is called the diastolic pressure. It is a measure of the pressure in your arteries as the heart relaxes. What are the causes? The exact cause of this condition is not known. There are some conditions that result in high blood pressure. What increases the risk? Certain factors may make you more likely to develop high blood pressure. Some of these risk factors are under your control, including: Smoking. Not getting enough exercise or physical activity. Being overweight. Having too much fat, sugar, calories, or salt (sodium) in your diet. Drinking too much alcohol. Other risk factors include: Having a personal history of heart disease, diabetes, high cholesterol, or kidney disease. Stress. Having a family history of high blood pressure and high cholesterol. Having obstructive sleep apnea. Age. The risk increases with age. What are the signs or symptoms? High blood pressure may not cause symptoms. Very high blood pressure (hypertensive crisis) may cause: Headache. Fast or irregular heartbeats (palpitations). Shortness of breath. Nosebleed. Nausea and vomiting. Vision changes. Severe chest pain, dizziness, and seizures. How is this diagnosed? This condition is diagnosed by  measuring your blood pressure while you are seated, with your arm resting on a flat surface, your legs uncrossed, and your feet flat on the floor. The cuff of the blood pressure monitor will be placed directly against the skin of your upper arm at the level of your heart. Blood pressure should be measured at least twice using the same arm. Certain conditions can cause a difference in blood pressure between your right and left arms. If you have a high blood pressure reading during one visit or you have normal blood pressure with other risk factors, you may be asked to: Return on a different day to have your blood pressure checked again. Monitor your blood pressure at home for 1 week or longer. If you are diagnosed with hypertension, you may have other blood or imaging tests to help your health care provider understand your overall risk for other conditions. How is this treated? This condition is treated by making healthy lifestyle changes, such as eating healthy foods, exercising more, and reducing your alcohol intake. You may be referred for counseling on a healthy diet and physical activity. Your health care provider may prescribe medicine if lifestyle changes are not enough to get your blood pressure under control and if: Your systolic blood pressure is above 130. Your diastolic blood pressure is above 80. Your personal target blood pressure may vary depending on your medical conditions, your age, and other factors. Follow these instructions at home: Eating and drinking  Eat a diet that is high in fiber and potassium, and low in sodium, added sugar, and fat. An example of this eating plan is called the DASH diet. DASH stands for Dietary Approaches to Stop Hypertension. To eat this way: Eat   plenty of fresh fruits and vegetables. Try to fill one half of your plate at each meal with fruits and vegetables. Eat whole grains, such as whole-wheat pasta, brown rice, or whole-grain bread. Fill about one  fourth of your plate with whole grains. Eat or drink low-fat dairy products, such as skim milk or low-fat yogurt. Avoid fatty cuts of meat, processed or cured meats, and poultry with skin. Fill about one fourth of your plate with lean proteins, such as fish, chicken without skin, beans, eggs, or tofu. Avoid pre-made and processed foods. These tend to be higher in sodium, added sugar, and fat. Reduce your daily sodium intake. Many people with hypertension should eat less than 1,500 mg of sodium a day. Do not drink alcohol if: Your health care provider tells you not to drink. You are pregnant, may be pregnant, or are planning to become pregnant. If you drink alcohol: Limit how much you have to: 0-1 drink a day for women. 0-2 drinks a day for men. Know how much alcohol is in your drink. In the [place] one drink equals one 12 oz bottle of beer (355 mL), one 5 oz glass of wine (148 mL), or one 1 oz glass of hard liquor (44 mL). Lifestyle  Work with your health care provider to maintain a healthy body weight or to lose weight. Ask what an ideal weight is for you. Get at least 30 minutes of exercise that causes your heart to beat faster (aerobic exercise) most days of the week. Activities may include walking, swimming, or biking. Include exercise to strengthen your muscles (resistance exercise), such as Pilates or lifting weights, as part of your weekly exercise routine. Try to do these types of exercises for 30 minutes at least 3 days a week. Do not use any products that contain nicotine or tobacco. These products include cigarettes, chewing tobacco, and vaping devices, such as e-cigarettes. If you need help quitting, ask your health care provider. Monitor your blood pressure at home as told by your health care provider. Keep all follow-up visits. This is important. Medicines Take over-the-counter and prescription medicines only as told by your health care provider. Follow directions carefully. Blood  pressure medicines must be taken as prescribed. Do not skip doses of blood pressure medicine. Doing this puts you at risk for problems and can make the medicine less effective. Ask your health care provider about side effects or reactions to medicines that you should watch for. Contact a health care provider if you: Think you are having a reaction to a medicine you are taking. Have headaches that keep coming back (recurring). Feel dizzy. Have swelling in your ankles. Have trouble with your vision. Get help right away if you: Develop a severe headache or confusion. Have unusual weakness or numbness. Feel faint. Have severe pain in your chest or abdomen. Vomit repeatedly. Have trouble breathing. These symptoms may be an emergency. Get help right away. Call 911. Do not wait to see if the symptoms will go away. Do not drive yourself to the hospital. Summary Hypertension is when the force of blood pumping through your arteries is too strong. If this condition is not controlled, it may put you at risk for serious complications. Your personal target blood pressure may vary depending on your medical conditions, your age, and other factors. For most people, a normal blood pressure is less than 120/80. Hypertension is treated with lifestyle changes, medicines, or a combination of both. Lifestyle changes include losing weight, eating a healthy,   low-sodium diet, exercising more, and limiting alcohol. This information is not intended to replace advice given to you by your health care provider. Make sure you discuss any questions you have with your health care provider. Document Revised: 03/21/2021 Document Reviewed: 03/21/2021 Elsevier Patient Education  2023 Elsevier Inc.  

## 2022-10-02 NOTE — Addendum Note (Signed)
Addended by: Daisy Blossom on: 10/02/2022 09:32 AM   Modules accepted: Orders

## 2022-10-02 NOTE — Addendum Note (Signed)
Addended by: Jannifer Rodney A on: 10/02/2022 09:19 AM   Modules accepted: Orders

## 2022-10-02 NOTE — Progress Notes (Addendum)
Subjective:    Patient ID: Holly Price, female    DOB: [birth year], 72 y.o.   MRN: [id number]  Chief Complaint  Patient presents with   Medical Management of Chronic Issues   PT presents to the office today for chronic follow up. She has a hx of DVT and was taken off her xarelto in February 2020.    She reports her memory is stable, but admits to being forgetful at times.  Hypertension This is a chronic problem. The current episode started more than 1 year ago. The problem has been resolved since onset. The problem is controlled. Pertinent negatives include no blurred vision, malaise/fatigue, peripheral edema or shortness of breath. Risk factors for coronary artery disease include dyslipidemia, diabetes mellitus, obesity and sedentary lifestyle. The current treatment provides moderate improvement.  Hyperlipidemia This is a chronic problem. The current episode started more than 1 year ago. The problem is controlled. Recent lipid tests were reviewed and are normal. Exacerbating diseases include obesity. Pertinent negatives include no shortness of breath. Current antihyperlipidemic treatment includes statins. The current treatment provides moderate improvement of lipids. Risk factors for coronary artery disease include dyslipidemia, diabetes mellitus, hypertension, a sedentary lifestyle and post-menopausal.  Diabetes She presents for her follow-up diabetic visit. She has type 2 diabetes mellitus. Pertinent negatives for diabetes include no blurred vision and no foot paresthesias. Symptoms are stable. Risk factors for coronary artery disease include dyslipidemia, diabetes mellitus, hypertension, sedentary lifestyle and post-menopausal. She is following a generally healthy diet. Her overall blood glucose range is 110-130 mg/dl. Eye exam is not current.  Back Pain This is a chronic problem. The current episode started more than 1 year ago. The problem occurs intermittently. The problem has been  waxing and waning since onset. The pain is present in the thoracic spine. The quality of the pain is described as aching. The pain is at a severity of 6/10. The pain is moderate. The symptoms are aggravated by twisting. She has tried analgesics for the symptoms. The treatment provided moderate relief.      Review of Systems  Constitutional:  Negative for malaise/fatigue.  Eyes:  Negative for blurred vision.  Respiratory:  Negative for shortness of breath.   Musculoskeletal:  Positive for back pain.  All other systems reviewed and are negative.      Objective:   Physical Exam Vitals reviewed.  Constitutional:      General: She is not in acute distress.    Appearance: She is well-developed.  HENT:     Head: Normocephalic and atraumatic.     Right Ear: Tympanic membrane normal.     Left Ear: Tympanic membrane normal.  Eyes:     Pupils: Pupils are equal, round, and reactive to light.  Neck:     Thyroid: No thyromegaly.  Cardiovascular:     Rate and Rhythm: Normal rate and regular rhythm.     Heart sounds: Normal heart sounds. No murmur heard. Pulmonary:     Effort: Pulmonary effort is normal. No respiratory distress.     Breath sounds: Normal breath sounds. No wheezing.  Abdominal:     General: Bowel sounds are normal. There is no distension.     Palpations: Abdomen is soft.     Tenderness: There is no abdominal tenderness.  Musculoskeletal:        General: No tenderness. Normal range of motion.     Cervical back: Normal range of motion and neck supple.  Skin:    General: Skin is  warm and dry.  Neurological:     Mental Status: She is alert and oriented to person, place, and time.     Cranial Nerves: No cranial nerve deficit.     Deep Tendon Reflexes: Reflexes are normal and symmetric.  Psychiatric:        Behavior: Behavior normal.        Thought Content: Thought content normal.        Judgment: Judgment normal.       BP (!) 144/62   Pulse 63   Temp (!) 97.1 F  (36.2 C) (Temporal)   Ht 5\' 4"  (1.626 m)   Wt 157 lb 11.2 oz (71.5 kg)   SpO2 99%   BMI 27.07 kg/m      Assessment & Plan:  Holly Price comes in today with chief complaint of Medical Management of Chronic Issues   Diagnosis and orders addressed:  1. Diabetes mellitus due to underlying condition with hyperosmolarity without coma, without long-term current use of insulin (HCC) - CBC with Differential/Platelet - Bayer DCA Hb A1c Waived  2. Hyperlipidemia associated with type 2 diabetes mellitus (HCC) - Lipid panel  3. Hypertension associated with diabetes (HCC) - CMP14+EGFR  4. Diabetic polyneuropathy associated with type 2 diabetes mellitus (HCC)  5. Dementia with anxiety, unspecified dementia severity, unspecified dementia type (HCC)  6. Controlled substance agreement signed  7. History of DVT (deep vein thrombosis)   8. Mid back pain  9. Overweight (BMI 25.0-29.9)   10. Vitamin D deficiency  11. 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  Health Maintenance reviewed Diet and exercise encouraged  Follow up plan: 4 months    Jannifer Rodney, FNP

## 2022-10-03 LAB — CBC WITH DIFFERENTIAL/PLATELET
Basophils Absolute: 0.1 10*3/uL (ref 0.0–0.2)
Basos: 1 %
EOS (ABSOLUTE): 0.2 10*3/uL (ref 0.0–0.4)
Eos: 2 %
Hematocrit: 46.7 % — ABNORMAL HIGH (ref 34.0–46.6)
Hemoglobin: 15.6 g/dL (ref 11.1–15.9)
Immature Grans (Abs): 0 10*3/uL (ref 0.0–0.1)
Immature Granulocytes: 0 %
Lymphocytes Absolute: 2.6 10*3/uL (ref 0.7–3.1)
Lymphs: 32 %
MCH: 32.4 pg (ref 26.6–33.0)
MCHC: 33.4 g/dL (ref 31.5–35.7)
MCV: 97 fL (ref 79–97)
Monocytes Absolute: 0.8 10*3/uL (ref 0.1–0.9)
Monocytes: 9 %
Neutrophils Absolute: 4.5 10*3/uL (ref 1.4–7.0)
Neutrophils: 56 %
Platelets: 251 10*3/uL (ref 150–450)
RBC: 4.81 x10E6/uL (ref 3.77–5.28)
RDW: 12.2 % (ref 11.7–15.4)
WBC: 8.1 10*3/uL (ref 3.4–10.8)

## 2022-10-03 LAB — CMP14+EGFR
ALT: 11 IU/L (ref 0–32)
AST: 14 IU/L (ref 0–40)
Albumin/Globulin Ratio: 2 (ref 1.2–2.2)
Alkaline Phosphatase: 43 IU/L — ABNORMAL LOW (ref 44–121)
BUN/Creatinine Ratio: 28 (ref 12–28)
BUN: 25 mg/dL (ref 8–27)
Calcium: 9.7 mg/dL (ref 8.7–10.3)
Chloride: 99 mmol/L (ref 96–106)
Creatinine, Ser: 0.9 mg/dL (ref 0.57–1.00)
Glucose: 119 mg/dL — ABNORMAL HIGH (ref 70–99)
Sodium: 140 mmol/L (ref 134–144)
Total Protein: 6.7 g/dL (ref 6.0–8.5)
eGFR: 69 mL/min/{1.73_m2} (ref >59)

## 2022-10-03 LAB — LIPID PANEL
Chol/HDL Ratio: 2.4 ratio (ref 0.0–4.4)
Cholesterol, Total: 146 mg/dL (ref 100–199)
HDL: 60 mg/dL (ref >39)
LDL Chol Calc (NIH): 68 mg/dL (ref 0–99)
Triglycerides: 96 mg/dL (ref 0–149)
VLDL Cholesterol Cal: 18 mg/dL (ref 5–40)

## 2022-10-04 ENCOUNTER — Other Ambulatory Visit: Payer: Self-pay | Admitting: Family

## 2022-10-08 ENCOUNTER — Telehealth: Payer: Self-pay | Admitting: *Deleted

## 2022-10-08 DIAGNOSIS — D369 Benign neoplasm, unspecified site: Secondary | ICD-10-CM

## 2022-10-08 NOTE — Telephone Encounter (Signed)
  Procedure: Colonoscopy  Have you had a colonoscopy before?  10/14/19, Dr. Jena Gauss  Do you have family history of colon cancer?  no  Do you have a family history of polyps? no  Previous colonoscopy with polyps removed? no  Do you have a history colorectal cancer?   no  Are you diabetic?  Type 2  Do you have a prosthetic or mechanical heart valve? no  Do you have a pacemaker/defibrillator?   no  Have you had endocarditis/atrial fibrillation?  no  Do you use supplemental oxygen/CPAP?  no  Have you had joint replacement within the last 12 months?  no  Do you tend to be constipated or have to use laxatives?  no   Do you have history of alcohol use? If yes, how much and how often.  no  Do you have history or are you using drugs? If yes, what do are you  using?  no  Have you ever had a stroke/heart attack?  no  Have you ever had a heart or other vascular stent placed,?no  Do you take weight loss medication? no  female patients,: have you had a hysterectomy? no                              are you post menopausal?  yes                              do you still have your menstrual cycle? no    Date of last menstrual period?   Do you take any blood-thinning medications such as: (Plavix, aspirin, Coumadin, Aggrenox, Brilinta, Xarelto, Eliquis, Pradaxa, Savaysa or Effient)? no  If yes we need the name, milligram, dosage and who is prescribing doctor:               Current Outpatient Medications  Medication Sig Dispense Refill   amLODipine (NORVASC) 5 MG tablet Take 1 tablet (5 mg total) by mouth daily. 90 tablet 1   atenolol (TENORMIN) 50 MG tablet TAKE ONE (1) TABLET BY MOUTH EVERY DAY 90 tablet 1   baclofen (LIORESAL) 10 MG tablet TAKE 1/2 TABLET BY MOUTH 3 TIMES DAILY 90 each 2   Calcium Citrate-Vitamin D (CALCIUM + D PO) Take 1 tablet by mouth daily.     lisinopril-hydrochlorothiazide (ZESTORETIC) 10-12.5 MG tablet Take 1 tablet by mouth daily.     meloxicam (MOBIC) 15 MG  tablet TAKE ONE (1) TABLET BY MOUTH EVERY DAY 90 tablet 0   rosuvastatin (CRESTOR) 5 MG tablet TAKE ONE (1) TABLET BY MOUTH EVERY DAY 90 tablet 0   traMADol (ULTRAM) 50 MG tablet Take 50 mg by mouth every 6 (six) hours as needed.     zinc gluconate 50 MG tablet Take 50 mg by mouth daily.     No current facility-administered medications for this visit.    No Known Allergies

## 2022-10-09 NOTE — Telephone Encounter (Signed)
Last colonoscopy in 2021 with 3 tubular adenomas removed.  Recommended 3-year surveillance.  Okay to schedule. ASA 2 BMP prior to procedure

## 2022-10-10 NOTE — Telephone Encounter (Signed)
LMTCB

## 2022-10-12 ENCOUNTER — Other Ambulatory Visit: Payer: Self-pay | Admitting: Family

## 2022-10-12 DIAGNOSIS — E1159 Type 2 diabetes mellitus with other circulatory complications: Secondary | ICD-10-CM

## 2022-10-16 NOTE — Telephone Encounter (Signed)
Letter mailed

## 2022-10-24 NOTE — Telephone Encounter (Signed)
Pt returned call. Aware will call back to schedule once we receive Dr. Luvenia Starch future schedule. She provided correct # to call [phone]

## 2022-10-26 NOTE — Telephone Encounter (Signed)
Called # provided, no answer and not able to leave VM. WCB

## 2022-10-29 MED ORDER — NA SULFATE-K SULFATE-MG SULF 17.5-3.13-1.6 GM/177ML PO SOLN: ORAL | 0 refills | Status: DC

## 2022-10-29 NOTE — Telephone Encounter (Signed)
Spoke with pt daughter. She has been scheduled for 7/1. Aware will send instructions. Rx for prep will be sent to pharmacy. Aware needs BMET prior. Order placed for [facility] per daughter.

## 2022-10-29 NOTE — Addendum Note (Signed)
Addended by: Armstead Peaks on: 10/29/2022 10:00 AM   Modules accepted: Orders

## 2022-11-20 DIAGNOSIS — D369 Benign neoplasm, unspecified site: Secondary | ICD-10-CM | POA: Diagnosis not present

## 2022-11-21 LAB — BASIC METABOLIC PANEL
BUN/Creatinine Ratio: 25 (ref 12–28)
BUN: 24 mg/dL (ref 8–27)
CO2: 24 mmol/L (ref 20–29)
Calcium: 10.1 mg/dL (ref 8.7–10.3)
Chloride: 102 mmol/L (ref 96–106)
Creatinine, Ser: 0.96 mg/dL (ref 0.57–1.00)
Glucose: 127 mg/dL — ABNORMAL HIGH (ref 70–99)
Potassium: 5.7 mmol/L — ABNORMAL HIGH (ref 3.5–5.2)
Sodium: 142 mmol/L (ref 134–144)
eGFR: 64 mL/min/{1.73_m2} (ref >59)

## 2022-11-22 ENCOUNTER — Other Ambulatory Visit: Payer: Self-pay | Admitting: *Deleted

## 2022-11-22 DIAGNOSIS — D369 Benign neoplasm, unspecified site: Secondary | ICD-10-CM | POA: Diagnosis not present

## 2022-11-22 NOTE — Telephone Encounter (Signed)
Patient son called and they are taking pt to [facility] today for recheck of lab

## 2022-11-22 NOTE — Addendum Note (Signed)
Addended by: Armstead Peaks on: 11/22/2022 09:15 AM   Modules accepted: Orders

## 2022-11-23 LAB — BASIC METABOLIC PANEL
BUN/Creatinine Ratio: 27 (ref 12–28)
BUN: 24 mg/dL (ref 8–27)
CO2: 27 mmol/L (ref 20–29)
Calcium: 9.7 mg/dL (ref 8.7–10.3)
Chloride: 101 mmol/L (ref 96–106)
Creatinine, Ser: 0.89 mg/dL (ref 0.57–1.00)
Glucose: 98 mg/dL (ref 70–99)
Potassium: 5.6 mmol/L — ABNORMAL HIGH (ref 3.5–5.2)
Sodium: 142 mmol/L (ref 134–144)
eGFR: 70 mL/min/{1.73_m2} (ref >59)

## 2022-11-26 ENCOUNTER — Ambulatory Visit (HOSPITAL_BASED_OUTPATIENT_CLINIC_OR_DEPARTMENT_OTHER): Payer: Medicare Other | Admitting: Anesthesiology

## 2022-11-26 ENCOUNTER — Encounter (HOSPITAL_COMMUNITY)
Admission: RE | Disposition: A | Discharge status: Home / Self Care | Payer: Self-pay | Source: Home / Self Care | Attending: Internal Medicine

## 2022-11-26 ENCOUNTER — Other Ambulatory Visit: Payer: Self-pay

## 2022-11-26 ENCOUNTER — Encounter (HOSPITAL_COMMUNITY): Payer: Self-pay | Admitting: Internal Medicine

## 2022-11-26 ENCOUNTER — Ambulatory Visit (HOSPITAL_COMMUNITY)
Admission: RE | Admit: 2022-11-26 | Discharge: 2022-11-26 | Disposition: A | Discharge status: Home / Self Care | Payer: Medicare Other | Attending: Internal Medicine | Admitting: Internal Medicine

## 2022-11-26 ENCOUNTER — Ambulatory Visit (HOSPITAL_COMMUNITY): Payer: Medicare Other | Admitting: Anesthesiology

## 2022-11-26 DIAGNOSIS — Z86718 Personal history of other venous thrombosis and embolism: Secondary | ICD-10-CM | POA: Insufficient documentation

## 2022-11-26 DIAGNOSIS — M797 Fibromyalgia: Secondary | ICD-10-CM | POA: Insufficient documentation

## 2022-11-26 DIAGNOSIS — D175 Benign lipomatous neoplasm of intra-abdominal organs: Secondary | ICD-10-CM | POA: Diagnosis not present

## 2022-11-26 DIAGNOSIS — Z1211 Encounter for screening for malignant neoplasm of colon: Secondary | ICD-10-CM | POA: Diagnosis not present

## 2022-11-26 DIAGNOSIS — K5730 Diverticulosis of large intestine without perforation or abscess without bleeding: Secondary | ICD-10-CM | POA: Diagnosis not present

## 2022-11-26 DIAGNOSIS — E119 Type 2 diabetes mellitus without complications: Secondary | ICD-10-CM | POA: Insufficient documentation

## 2022-11-26 DIAGNOSIS — F32A Depression, unspecified: Secondary | ICD-10-CM | POA: Insufficient documentation

## 2022-11-26 DIAGNOSIS — F0390 Unspecified dementia without behavioral disturbance: Secondary | ICD-10-CM | POA: Insufficient documentation

## 2022-11-26 DIAGNOSIS — Z86010 Personal history of colonic polyps: Secondary | ICD-10-CM | POA: Diagnosis not present

## 2022-11-26 DIAGNOSIS — J449 Chronic obstructive pulmonary disease, unspecified: Secondary | ICD-10-CM | POA: Diagnosis not present

## 2022-11-26 DIAGNOSIS — Z87891 Personal history of nicotine dependence: Secondary | ICD-10-CM

## 2022-11-26 DIAGNOSIS — F418 Other specified anxiety disorders: Secondary | ICD-10-CM | POA: Diagnosis not present

## 2022-11-26 DIAGNOSIS — K219 Gastro-esophageal reflux disease without esophagitis: Secondary | ICD-10-CM | POA: Insufficient documentation

## 2022-11-26 DIAGNOSIS — I10 Essential (primary) hypertension: Secondary | ICD-10-CM | POA: Insufficient documentation

## 2022-11-26 DIAGNOSIS — K5790 Diverticulosis of intestine, part unspecified, without perforation or abscess without bleeding: Secondary | ICD-10-CM | POA: Diagnosis not present

## 2022-11-26 DIAGNOSIS — F419 Anxiety disorder, unspecified: Secondary | ICD-10-CM | POA: Insufficient documentation

## 2022-11-26 HISTORY — PX: COLONOSCOPY WITH PROPOFOL: SHX5780

## 2022-11-26 SURGERY — COLONOSCOPY WITH PROPOFOL
Anesthesia: General

## 2022-11-26 MED ORDER — PROPOFOL 500 MG/50ML IV EMUL
INTRAVENOUS | Status: DC | PRN
  Administered 2022-11-26: 150 ug/kg/min via INTRAVENOUS

## 2022-11-26 MED ORDER — LACTATED RINGERS IV SOLN: INTRAVENOUS | Status: DC

## 2022-11-26 MED ORDER — LIDOCAINE HCL (CARDIAC) PF 100 MG/5ML IV SOSY
PREFILLED_SYRINGE | INTRAVENOUS | Status: DC | PRN
  Administered 2022-11-26: 50 mg via INTRAVENOUS

## 2022-11-26 MED ORDER — PROPOFOL 10 MG/ML IV BOLUS
INTRAVENOUS | Status: DC | PRN
  Administered 2022-11-26: 100 mg via INTRAVENOUS

## 2022-11-26 NOTE — Transfer of Care (Signed)
Immediate Anesthesia Transfer of Care Note  Patient: Holly Price  Procedure(s) Performed: COLONOSCOPY WITH PROPOFOL  Patient Location: Endoscopy Unit  Anesthesia Type:General  Level of Consciousness: drowsy  Airway & Oxygen Therapy: Patient Spontanous Breathing  Post-op Assessment: Report given to RN and Post -op Vital signs reviewed and stable  Post vital signs: Reviewed and stable  Last Vitals:  Vitals Value Taken Time  BP 123/48 11/26/22 0915  Temp 36.4 C 11/26/22 0909  Pulse 65 11/26/22 0909  Resp 15 11/26/22 0909  SpO2 98 % 11/26/22 0912    Last Pain:  Vitals:   11/26/22 0909  TempSrc: Oral  PainSc: 0-No pain      Patients Stated Pain Goal: 5 (11/26/22 0741)  Complications: No notable events documented.

## 2022-11-26 NOTE — Op Note (Signed)
[facility] Patient Name: Holly Price Procedure Date: 11/26/2022 8:31 AM MRN: [id number] Date of Birth: 17-Jan-1951 Attending MD: Gennette Pac , MD, [id number] CSN: [id number] Age: 72 Admit Type: Outpatient Procedure:                Colonoscopy Indications:              High risk colon cancer surveillance: Personal                            history of colonic polyps Providers:                Gennette Pac, MD, Buel Ream. Thomasena Edis RN, RN,                            [employer], [employer]. Jessee Avers, Technician,                            Dyann Ruddle Referring MD:              Medicines:                Propofol per Anesthesia Complications:            No immediate complications. Estimated Blood Loss:     Estimated blood loss: none. Procedure:                Pre-Anesthesia Assessment:                           - Prior to the procedure, a History and Physical                            was performed, and patient medications and                            allergies were reviewed. The patient's tolerance of                            previous anesthesia was also reviewed. The risks                            and benefits of the procedure and the sedation                            options and risks were discussed with the patient.                            All questions were answered, and informed consent                            was obtained. Prior Anticoagulants: The patient has                            taken no anticoagulant or antiplatelet agents. ASA  Grade Assessment: II - A patient with mild systemic                            disease. After reviewing the risks and benefits,                            the patient was deemed in satisfactory condition to                            undergo the procedure.                           After obtaining informed consent, the colonoscope                            was passed under direct  vision. Throughout the                            procedure, the patient's blood pressure, pulse, and                            oxygen saturations were monitored continuously. The                            [id number]) scope was introduced through the                            anus and advanced to the the cecum, identified by                            appendiceal orifice and ileocecal valve. The                            colonoscopy was performed without difficulty. The                            patient tolerated the procedure well. The quality                            of the bowel preparation was adequate. The entire                            colon was well visualized. The ileocecal valve,                            appendiceal orifice, and rectum were photographed.                            The colonoscopy was performed without difficulty.                            The patient tolerated the procedure well. The  quality of the bowel preparation was adequate. Scope In: 8:53:16 AM Scope Out: 9:06:04 AM Scope Withdrawal Time: 0 hours 8 minutes 11 seconds  Total Procedure Duration: 0 hours 12 minutes 48 seconds  Findings:      The perianal and digital rectal examinations were normal.      Scattered small-mouthed diverticula were found in the sigmoid colon and       descending colon. (2) 1 cm yellowish soft submucosal nodules positive       pillow sign ascending segment and cecum      The exam was otherwise without abnormality on direct and retroflexion       views. Impression:               - Diverticulosis in the sigmoid colon and in the                            descending colon. Colonic lipoma                           - The examination was otherwise normal on direct                            and retroflexion views.                           - No specimens collected. Moderate Sedation:      Moderate (conscious) sedation was personally  administered by an       anesthesia professional. The following parameters were monitored: oxygen       saturation, heart rate, blood pressure, respiratory rate, EKG, adequacy       of pulmonary ventilation, and response to care. Recommendation:           - Patient has a contact number available for                            emergencies. The signs and symptoms of potential                            delayed complications were discussed with the                            patient. Return to normal activities tomorrow.                            Written discharge instructions were provided to the                            patient.                           - Advance diet as tolerated.                           - Continue present medications.                           - Repeat colonoscopy in 5 years for surveillance.                           -  Return to GI office (date not yet determined). Procedure Code(s):        --- Professional ---                           [phone], Colonoscopy, flexible; diagnostic, including                            collection of specimen(s) by brushing or washing,                            when performed (separate procedure) Diagnosis Code(s):        --- Professional ---                           Z86.010, Personal history of colonic polyps                           K57.30, Diverticulosis of large intestine without                            perforation or abscess without bleeding CPT copyright 2022 [employer]. All rights reserved. The codes documented in this report are preliminary and upon coder review may  be revised to meet current compliance requirements. Gerrit Friends. Gudrun Axe, MD Gennette Pac, MD 11/26/2022 9:18:58 AM This report has been signed electronically. Number of Addenda: 0

## 2022-11-26 NOTE — Discharge Instructions (Addendum)
  Colonoscopy Discharge Instructions  Read the instructions outlined below and refer to this sheet in the next few weeks. These discharge instructions provide you with general information on caring for yourself after you leave the hospital. Your doctor may also give you specific instructions. While your treatment has been planned according to the most current medical practices available, unavoidable complications occasionally occur. If you have any problems or questions after discharge, call Dr. Jena Gauss at [phone]. ACTIVITY You may resume your regular activity, but move at a slower pace for the next 24 hours.  Take frequent rest periods for the next 24 hours.  Walking will help get rid of the air and reduce the bloated feeling in your belly (abdomen).  No driving for 24 hours (because of the medicine (anesthesia) used during the test).   Do not sign any important legal documents or operate any machinery for 24 hours (because of the anesthesia used during the test).  NUTRITION Drink plenty of fluids.  You may resume your normal diet as instructed by your doctor.  Begin with a light meal and progress to your normal diet. Heavy or fried foods are harder to digest and may make you feel sick to your stomach (nauseated).  Avoid alcoholic beverages for 24 hours or as instructed.  MEDICATIONS You may resume your normal medications unless your doctor tells you otherwise.  WHAT YOU CAN EXPECT TODAY Some feelings of bloating in the abdomen.  Passage of more gas than usual.  Spotting of blood in your stool or on the toilet paper.  IF YOU HAD POLYPS REMOVED DURING THE COLONOSCOPY: No aspirin products for 7 days or as instructed.  No alcohol for 7 days or as instructed.  Eat a soft diet for the next 24 hours.  FINDING OUT THE RESULTS OF YOUR TEST Not all test results are available during your visit. If your test results are not back during the visit, make an appointment with your caregiver to find out the  results. Do not assume everything is normal if you have not heard from your caregiver or the medical facility. It is important for you to follow up on all of your test results.  SEEK IMMEDIATE MEDICAL ATTENTION IF: You have more than a spotting of blood in your stool.  Your belly is swollen (abdominal distention).  You are nauseated or vomiting.  You have a temperature over 101.  You have abdominal pain or discomfort that is severe or gets worse throughout the day.     No polyps found today  Diverticulosis information provided  Recommend repeat colonoscopy in 5 years.  At patient request, called Briant Sites at [phone] findings and recommendations

## 2022-11-26 NOTE — Anesthesia Preprocedure Evaluation (Signed)
Anesthesia Evaluation  Patient identified by MRN, date of birth, ID band Patient awake    Reviewed: Allergy & Precautions, H&P , NPO status , Patient's Chart, lab work & pertinent test results, reviewed documented beta blocker date and time   Airway Mallampati: II  TM Distance: >3 FB Neck ROM: full    Dental no notable dental hx.    Pulmonary neg pulmonary ROS, COPD, former smoker   Pulmonary exam normal breath sounds clear to auscultation       Cardiovascular Exercise Tolerance: Good hypertension, negative cardio ROS  Rhythm:regular Rate:Normal     Neuro/Psych  PSYCHIATRIC DISORDERS Anxiety Depression   Dementia  Neuromuscular disease negative neurological ROS  negative psych ROS   GI/Hepatic negative GI ROS, Neg liver ROS,GERD  ,,  Endo/Other  negative endocrine ROSdiabetes    Renal/GU negative Renal ROS  negative genitourinary   Musculoskeletal   Abdominal   Peds  Hematology negative hematology ROS (+)   Anesthesia Other Findings   Reproductive/Obstetrics negative OB ROS                             Anesthesia Physical Anesthesia Plan  ASA: 3  Anesthesia Plan: General   Post-op Pain Management:    Induction:   PONV Risk Score and Plan: Propofol infusion  Airway Management Planned:   Additional Equipment:   Intra-op Plan:   Post-operative Plan:   Informed Consent: I have reviewed the patients History and Physical, chart, labs and discussed the procedure including the risks, benefits and alternatives for the proposed anesthesia with the patient or authorized representative who has indicated his/her understanding and acceptance.     Dental Advisory Given  Plan Discussed with: CRNA  Anesthesia Plan Comments:        Anesthesia Quick Evaluation

## 2022-11-26 NOTE — Anesthesia Procedure Notes (Signed)
Date/Time: 11/26/2022 9:04 AM  Performed by: Julian Reil, CRNAPre-anesthesia Checklist: Patient identified, Emergency Drugs available, Suction available and Patient being monitored Patient Re-evaluated:Patient Re-evaluated prior to induction Oxygen Delivery Method: Nasal cannula Induction Type: IV induction Placement Confirmation: positive ETCO2

## 2022-11-26 NOTE — H&P (Signed)
@LOGO @   Primary Care Physician:  Junie Spencer, FNP Primary Gastroenterologist:  Dr. Jena Gauss  Pre-Procedure History & Physical: HPI:  Holly Price is a 72 y.o. female here for surveillance colonoscopy.  Advanced adenoma (by size) removed 3 years ago.  She is here for surveillance examination  Past Medical History:  Diagnosis Date   Anxiety    COPD (chronic obstructive pulmonary disease) (HCC)    DDD (degenerative disc disease)    Depression    Diabetes mellitus without complication (HCC)    DVT (deep venous thrombosis) (HCC) 2017   stopped Xarelto in Feb 2020   Fibromyalgia    GERD (gastroesophageal reflux disease)    Hypertension    Vitamin D deficiency disease     Past Surgical History:  Procedure Laterality Date   ABDOMINAL HYSTERECTOMY     BREAST BIOPSY Right 09/01/2021   BREAST BIOPSY Left    CHOLECYSTECTOMY     COLONOSCOPY N/A 10/14/2019   Procedure: COLONOSCOPY;  Surgeon: Corbin Ade, MD;  Location: [facility];  Service: Endoscopy;  Laterality: N/A;  8:30am   FOOT SURGERY Bilateral    HAND SURGERY Bilateral    POLYPECTOMY  10/14/2019   Procedure: POLYPECTOMY;  Surgeon: Corbin Ade, MD;  Location: [facility];  Service: Endoscopy;;   SPINE SURGERY     TONSILLECTOMY      Prior to Admission medications   Medication Sig Start Date End Date Taking? Authorizing Provider  amLODipine (NORVASC) 5 MG tablet TAKE ONE (1) TABLET BY MOUTH EVERY DAY 10/12/22  Yes Hawks, Christy A, FNP  atenolol (TENORMIN) 50 MG tablet TAKE ONE (1) TABLET BY MOUTH EVERY DAY 03/05/22  Yes Hawks, Christy A, FNP  baclofen (LIORESAL) 10 MG tablet TAKE 1/2 TABLET BY MOUTH 3 TIMES DAILY 03/05/22  Yes Hawks, Christy A, FNP  Calcium Citrate-Vitamin D (CALCIUM + D PO) Take 1 tablet by mouth daily.   Yes [provider]  lisinopril-hydrochlorothiazide (ZESTORETIC) 10-12.5 MG tablet Take 1 tablet by mouth daily.   Yes [provider]  meloxicam (MOBIC) 15 MG tablet TAKE  ONE (1) TABLET BY MOUTH EVERY DAY 08/27/22  Yes Junie Spencer, FNP  Na Sulfate-K Sulfate-Mg Sulf 17.5-3.13-1.6 GM/177ML SOLN As directed 10/29/22  Yes Hilmar Moldovan, Gerrit Friends, MD  rosuvastatin (CRESTOR) 5 MG tablet TAKE ONE (1) TABLET BY MOUTH EVERY DAY 08/31/22  Yes Hawks, Christy A, FNP  traMADol (ULTRAM) 50 MG tablet Take 50 mg by mouth every 6 (six) hours as needed.   Yes [provider]  zinc gluconate 50 MG tablet Take 50 mg by mouth daily.   Yes [provider]    Allergies as of 10/29/2022   (No Known Allergies)    Family History  Problem Relation Age of Onset   Cancer Mother    Heart attack Father    Hypertension Son    Heart attack Sister    Colon cancer Neg Hx     Social History   Socioeconomic History   Marital status: Married    Spouse name: Verdon Cummins    Number of children: 1   Years of education: 11th   Highest education level: 11th grade  Occupational History   Occupation: Retired     Comment: CNA  Tobacco Use   Smoking status: Former    Packs/day: 0.50    Years: 10.00    Additional pack years: 0.00    Total pack years: 5.00    Types: Cigarettes    Quit  date: 06/22/2012    Years since quitting: 10.4   Smokeless tobacco: Never  Vaping Use   Vaping Use: Never used  Substance and Sexual Activity   Alcohol use: Not Currently   Drug use: Not Currently   Sexual activity: Not Currently  Other Topics Concern   Not on file  Social History Narrative   Lives with husband of over 40 years   Son lives on the same street.   Social Determinants of Health   Financial Resource Strain: Low Risk  (05/11/2022)   Overall Financial Resource Strain (CARDIA)    Difficulty of Paying Living Expenses: Not hard at all  Food Insecurity: No Food Insecurity (05/11/2022)   Hunger Vital Sign    Worried About Running Out of Food in the Last Year: Never true    Ran Out of Food in the Last Year: Never true  Transportation Needs: No Transportation Needs (05/11/2022)    PRAPARE - Administrator, Civil Service (Medical): No    Lack of Transportation (Non-Medical): No  Physical Activity: Insufficiently Active (05/11/2022)   Exercise Vital Sign    Days of Exercise per Week: 3 days    Minutes of Exercise per Session: 30 min  Stress: No Stress Concern Present (05/11/2022)   [employer] of Occupational Health - Occupational Stress Questionnaire    Feeling of Stress : Not at all  Social Connections: Moderately Integrated (05/11/2022)   Social Connection and Isolation Panel [NHANES]    Frequency of Communication with Friends and Family: More than three times a week    Frequency of Social Gatherings with Friends and Family: More than three times a week    Attends Religious Services: More than 4 times per year    Active Member of [employer] or Organizations: No    Attends Banker Meetings: Never    Marital Status: Married  Catering manager Violence: Not At Risk (05/11/2022)   Humiliation, Afraid, Rape, and Kick questionnaire    Fear of Current or Ex-Partner: No    Emotionally Abused: No    Physically Abused: No    Sexually Abused: No    Review of Systems: See HPI, otherwise negative ROS  Physical Exam: BP (!) 168/63   Pulse 66   Temp 97.9 F (36.6 C) (Oral)   Resp 13   Ht 5\' 4"  (1.626 m)   Wt 61.7 kg   SpO2 96%   BMI 23.34 kg/m  General:   Alert,  Well-developed, well-nourished, pleasant and cooperative in NAD Mouth:  No deformity or lesions. Neck:  Supple; no masses or thyromegaly. No significant cervical adenopathy. Lungs:  Clear throughout to auscultation.   No wheezes, crackles, or rhonchi. No acute distress. Heart:  Regular rate and rhythm; no murmurs, clicks, rubs,  or gallops. Abdomen: Non-distended, normal bowel sounds.  Soft and nontender without appreciable mass or hepatosplenomegaly.   Impression/Plan: 72 year old lady with multiple adenomas 112 mm removed in 2021; here for surveillance colonoscopy per plan.   I will offer the patient a surveillance colonoscopy today. The risks, benefits, limitations, alternatives and imponderables have been reviewed with the patient. Questions have been answered. All parties are agreeable.       Notice: This dictation was prepared with Dragon dictation along with smaller phrase technology. Any transcriptional errors that result from this process are unintentional and may not be corrected upon review.

## 2022-11-28 ENCOUNTER — Other Ambulatory Visit: Payer: Self-pay | Admitting: Family

## 2022-11-28 DIAGNOSIS — M549 Dorsalgia, unspecified: Secondary | ICD-10-CM

## 2022-11-29 NOTE — Anesthesia Postprocedure Evaluation (Signed)
Anesthesia Post Note  Patient: Holly Price  Procedure(s) Performed: COLONOSCOPY WITH PROPOFOL  Patient location during evaluation: Phase II Anesthesia Type: General Level of consciousness: awake Pain management: pain level controlled Vital Signs Assessment: post-procedure vital signs reviewed and stable Respiratory status: spontaneous breathing and respiratory function stable Cardiovascular status: blood pressure returned to baseline and stable Postop Assessment: no headache and no apparent nausea or vomiting Anesthetic complications: no Comments: Late entry   No notable events documented.   Last Vitals:  Vitals:   11/26/22 0912 11/26/22 0915  BP: (!) 130/46 (!) 123/48  Pulse:    Resp:    Temp:    SpO2: 98%     Last Pain:  Vitals:   11/26/22 0909  TempSrc: Oral  PainSc: 0-No pain                 Windell Norfolk

## 2022-12-03 ENCOUNTER — Encounter (HOSPITAL_COMMUNITY): Payer: Self-pay | Admitting: Internal Medicine

## 2022-12-08 ENCOUNTER — Other Ambulatory Visit: Payer: Self-pay | Admitting: Family

## 2023-01-08 ENCOUNTER — Other Ambulatory Visit: Payer: Self-pay | Admitting: Family

## 2023-01-23 ENCOUNTER — Other Ambulatory Visit: Payer: Self-pay | Admitting: Family

## 2023-01-23 DIAGNOSIS — I152 Hypertension secondary to endocrine disorders: Secondary | ICD-10-CM

## 2023-01-23 DIAGNOSIS — E1159 Type 2 diabetes mellitus with other circulatory complications: Secondary | ICD-10-CM

## 2023-01-29 DIAGNOSIS — D6869 Other thrombophilia: Secondary | ICD-10-CM | POA: Diagnosis not present

## 2023-01-29 DIAGNOSIS — I2510 Atherosclerotic heart disease of native coronary artery without angina pectoris: Secondary | ICD-10-CM | POA: Diagnosis not present

## 2023-02-01 ENCOUNTER — Telehealth: Payer: Self-pay | Admitting: Family

## 2023-02-06 LAB — LAB REPORT - SCANNED: EGFR: 56

## 2023-02-07 LAB — LAB REPORT - SCANNED: Microalb Creat Ratio: 33

## 2023-03-01 ENCOUNTER — Other Ambulatory Visit: Payer: Self-pay | Admitting: Family

## 2023-03-01 DIAGNOSIS — M549 Dorsalgia, unspecified: Secondary | ICD-10-CM

## 2023-03-05 DIAGNOSIS — H524 Presbyopia: Secondary | ICD-10-CM | POA: Diagnosis not present

## 2023-03-05 DIAGNOSIS — H35033 Hypertensive retinopathy, bilateral: Secondary | ICD-10-CM | POA: Diagnosis not present

## 2023-03-05 DIAGNOSIS — H11153 Pinguecula, bilateral: Secondary | ICD-10-CM | POA: Diagnosis not present

## 2023-03-05 DIAGNOSIS — E119 Type 2 diabetes mellitus without complications: Secondary | ICD-10-CM | POA: Diagnosis not present

## 2023-03-05 DIAGNOSIS — H25813 Combined forms of age-related cataract, bilateral: Secondary | ICD-10-CM | POA: Diagnosis not present

## 2023-03-05 LAB — HM DIABETES EYE EXAM

## 2023-03-11 ENCOUNTER — Other Ambulatory Visit: Payer: Self-pay | Admitting: Family

## 2023-03-11 DIAGNOSIS — E1159 Type 2 diabetes mellitus with other circulatory complications: Secondary | ICD-10-CM

## 2023-03-11 DIAGNOSIS — M549 Dorsalgia, unspecified: Secondary | ICD-10-CM

## 2023-03-11 DIAGNOSIS — M961 Postlaminectomy syndrome, not elsewhere classified: Secondary | ICD-10-CM

## 2023-03-15 ENCOUNTER — Other Ambulatory Visit: Payer: Self-pay | Admitting: Family

## 2023-03-18 ENCOUNTER — Encounter: Payer: Self-pay | Admitting: Family

## 2023-03-21 DIAGNOSIS — H5203 Hypermetropia, bilateral: Secondary | ICD-10-CM | POA: Diagnosis not present

## 2023-03-22 ENCOUNTER — Ambulatory Visit: Payer: Medicare Other | Admitting: Family

## 2023-03-27 ENCOUNTER — Encounter: Payer: Self-pay | Admitting: Family

## 2023-03-28 ENCOUNTER — Telehealth: Payer: Self-pay | Admitting: Family

## 2023-03-28 ENCOUNTER — Encounter: Payer: Self-pay | Admitting: Family

## 2023-03-28 ENCOUNTER — Ambulatory Visit (INDEPENDENT_AMBULATORY_CARE_PROVIDER_SITE_OTHER): Payer: Medicare Other | Admitting: Family

## 2023-03-28 VITALS — BP 144/56 | HR 64 | Temp 98.7°F | Ht 64.0 in | Wt 147.8 lb

## 2023-03-28 DIAGNOSIS — F0394 Unspecified dementia, unspecified severity, with anxiety: Secondary | ICD-10-CM

## 2023-03-28 DIAGNOSIS — E785 Hyperlipidemia, unspecified: Secondary | ICD-10-CM

## 2023-03-28 DIAGNOSIS — E0800 Diabetes mellitus due to underlying condition with hyperosmolarity without nonketotic hyperglycemic-hyperosmolar coma (NKHHC): Secondary | ICD-10-CM

## 2023-03-28 DIAGNOSIS — E559 Vitamin D deficiency, unspecified: Secondary | ICD-10-CM | POA: Diagnosis not present

## 2023-03-28 DIAGNOSIS — E1169 Type 2 diabetes mellitus with other specified complication: Secondary | ICD-10-CM | POA: Diagnosis not present

## 2023-03-28 DIAGNOSIS — E663 Overweight: Secondary | ICD-10-CM

## 2023-03-28 DIAGNOSIS — Z0000 Encounter for general adult medical examination without abnormal findings: Secondary | ICD-10-CM

## 2023-03-28 DIAGNOSIS — I152 Hypertension secondary to endocrine disorders: Secondary | ICD-10-CM

## 2023-03-28 DIAGNOSIS — Z0001 Encounter for general adult medical examination with abnormal findings: Secondary | ICD-10-CM

## 2023-03-28 DIAGNOSIS — Z79899 Other long term (current) drug therapy: Secondary | ICD-10-CM

## 2023-03-28 DIAGNOSIS — M549 Dorsalgia, unspecified: Secondary | ICD-10-CM

## 2023-03-28 DIAGNOSIS — Z23 Encounter for immunization: Secondary | ICD-10-CM

## 2023-03-28 DIAGNOSIS — E1159 Type 2 diabetes mellitus with other circulatory complications: Secondary | ICD-10-CM

## 2023-03-28 LAB — BAYER DCA HB A1C WAIVED: HB A1C (BAYER DCA - WAIVED): 5.8 % — ABNORMAL HIGH (ref 4.8–5.6)

## 2023-03-28 MED ORDER — DONEPEZIL HCL 5 MG PO TABS
5.0000 mg | ORAL_TABLET | Freq: Every day | ORAL | 1 refills | Status: DC
Start: 2023-03-28 — End: 2023-06-21

## 2023-03-28 MED ORDER — MEMANTINE HCL 5 MG PO TABS
5.0000 mg | ORAL_TABLET | Freq: Two times a day (BID) | ORAL | 2 refills | Status: DC
Start: 2023-03-28 — End: 2023-09-09

## 2023-03-28 MED ORDER — TRAMADOL HCL 50 MG PO TABS
50.0000 mg | ORAL_TABLET | Freq: Four times a day (QID) | ORAL | 2 refills | Status: DC | PRN
Start: 2023-03-28 — End: 2023-12-30

## 2023-03-28 NOTE — Telephone Encounter (Signed)
REPORTS THAT PATIENTS DEMENTIA HAS WORSENED, SHORT TERM MEMORY LOSS

## 2023-03-28 NOTE — Patient Instructions (Signed)
 Dementia  Dementia is a condition that affects the way the brain works. It often affects thinking and memory.   There are many types of dementia, including:  Alzheimer's disease. This is the most common type.  Vascular dementia. This type may happen due to a stroke.  Lewy body dementia. This type may happen to people who have Parkinson's disease.  Frontotemporal dementia. This type is caused by damage to nerve cells in certain parts of the brain.  Some people may have more than one type.  What are the causes?  Dementia is caused by damage to cells in the brain. Some causes that can't be reversed include:  Having a condition that affects the blood vessels of the brain. This may be diabetes or heart disease.  Changes to genes.  Some causes that can be reversed or slowed down include:  Injury to the brain due to:  A growth called a tumor.  A blood clot.  Too much fluid in the brain.  Taking certain medicines.  An infection.  Problems with your thyroid.  Not having enough vitamin B12 in the body.  Having a disease that causes your body's defense system, called the immune system, to attack healthy parts of your body.  What are the signs or symptoms?  Symptoms of dementia start slowly and get worse with time. They may include:  Problems remembering events or people.  Getting lost easily.  Forgetting appointments or to pay bills.  Having trouble taking a bath or putting clothes on.  Having trouble planning and making meals.  Having trouble speaking.  Changes in behavior or mood.  How is this diagnosed?  Dementia may be diagnosed based on:  Your symptoms and medical history.  A physical exam.  Tests. These may include:  Tests to check your thinking and memory to see how your brain is working.  Lab tests. You may have tests on your blood or pee (urine).  Imaging tests, such as a CT scan, a PET scan, or an MRI.  Genetic testing. This may be done if other family members have had dementia.  Your health care provider will talk  with you and your family, friends, or caregivers about your history and symptoms.  How is this treated?  Treatment depends on the cause of the dementia and should start as soon as possible. It might include:  Taking medicines for symptoms.  Taking medicines to help control or slow down the dementia.  Treating the cause of your dementia.  Your provider can help you find support groups and other members of the health care team who can help with your care.  Follow these instructions at home:  Medicines  Take medicines only as told by your provider.  Use a pill organizer or pill reminder to help you keep track of your medicines.  Avoid taking medicines for pain or for sleep. These can affect your thinking.  Lifestyle  Make healthy choices.  Be active as told by your provider.  Do not smoke, vape, or use products with nicotine or tobacco in them. If you need help quitting, talk with your provider.  Do not drink alcohol.  When you feel a lot of stress, do something that helps you relax. Your provider can give you tips.  Spend time with other people.  Make sure you get good sleep at night. These tips can help:  Try not to take naps during the day.  Keep your bedroom dark and cool.  Do not exercise in  the few hours before you go to bed.  Do not have foods or drinks with caffeine at night.  Eating and drinking  Drink enough fluid to keep your pee pale yellow.  Eat a healthy diet.  General instructions    Talk with your provider to decide on:  What things you need help with.  What your safety needs are.  Ask your provider if it's safe for you to drive.  If told, wear a bracelet that tracks where you are or shows that you're a person with memory loss.  Work with your family to make big legal or health decisions. This may include things like advance directives, medical power of attorney, or a living will.  Where to find more information  Alzheimer's Association: WesternTunes.it  [employer] on Aging: BaseRingTones.pl  World Health  Organization: VisitDestination.com.br  Contact a health care provider if:  You have any new symptoms.  Your symptoms get worse.  You have problems with swallowing.  Get help right away if:  You feel very sad or feel like you may hurt yourself or others.  You have thoughts about taking your own life.  Your family members are worried about your safety.  These symptoms may be an emergency. Take one of these steps right away:  Go to your nearest emergency room.  Call 911.  Call the National Suicide Prevention Lifeline at (301)773-5824 or 988.  Text the Crisis Text Line at 4844940928.  This information is not intended to replace advice given to you by your health care provider. Make sure you discuss any questions you have with your health care provider.  Document Revised: 07/30/2022 Document Reviewed: 07/30/2022  Elsevier Patient Education  2024 [employer].

## 2023-03-28 NOTE — Telephone Encounter (Signed)
ok 

## 2023-03-28 NOTE — Progress Notes (Signed)
Subjective:    Patient ID: Holly Price, female    DOB: 1951-03-19, 72 y.o.   MRN: [id number]  Chief Complaint  Patient presents with   Medical Management of Chronic Issues   PT presents to the office today for CPE and chronic follow up. She has a hx of DVT and was taken off her xarelto in February 2020.    She reports her memory is stable, but admits to being forgetful at times. Family is with her and states her memory is worse.  Hypertension This is a chronic problem. The current episode started more than 1 year ago. The problem has been resolved since onset. The problem is controlled. Associated symptoms include blurred vision. Pertinent negatives include no malaise/fatigue, peripheral edema or shortness of breath. The current treatment provides moderate improvement.  Hyperlipidemia This is a chronic problem. The current episode started more than 1 year ago. The problem is controlled. Recent lipid tests were reviewed and are normal. Exacerbating diseases include obesity. Pertinent negatives include no shortness of breath. Current antihyperlipidemic treatment includes statins. The current treatment provides moderate improvement of lipids. Risk factors for coronary artery disease include dyslipidemia, diabetes mellitus, a sedentary lifestyle, hypertension and post-menopausal.  Diabetes She presents for her follow-up diabetic visit. She has type 2 diabetes mellitus. Associated symptoms include blurred vision and foot paresthesias. Risk factors for coronary artery disease include dyslipidemia, diabetes mellitus, hypertension, sedentary lifestyle and post-menopausal. She is following a generally unhealthy diet. (Unsure what BS are at home)  Back Pain This is a chronic problem. The current episode started more than 1 year ago. The problem occurs intermittently. The problem has been waxing and waning since onset. The pain is present in the lumbar spine. The pain is at a severity of 7/10. The pain  is moderate. Risk factors include obesity. She has tried analgesics for the symptoms. The treatment provided mild relief.       Review of Systems  Constitutional:  Negative for malaise/fatigue.  Eyes:  Positive for blurred vision.  Respiratory:  Negative for shortness of breath.   Musculoskeletal:  Positive for back pain.  All other systems reviewed and are negative.  Family History  Problem Relation Age of Onset   Cancer Mother    Heart attack Father    Hypertension Son    Heart attack Sister    Colon cancer Neg Hx    Social History   Socioeconomic History   Marital status: Married    Spouse name: Verdon Cummins    Number of children: 1   Years of education: 11th   Highest education level: 11th grade  Occupational History   Occupation: Retired     Comment: CNA  Tobacco Use   Smoking status: Former    Current packs/day: 0.00    Average packs/day: 0.5 packs/day for 10.0 years (5.0 ttl pk-yrs)    Types: Cigarettes    Start date: 06/22/2002    Quit date: 06/22/2012    Years since quitting: 10.7   Smokeless tobacco: Never  Vaping Use   Vaping status: Never Used  Substance and Sexual Activity   Alcohol use: Not Currently   Drug use: Not Currently   Sexual activity: Not Currently  Other Topics Concern   Not on file  Social History Narrative   Lives with husband of over 40 years   Son lives on the same street.   Social Determinants of Health   Financial Resource Strain: Low Risk  (05/11/2022)   Overall Financial Resource  Strain (CARDIA)    Difficulty of Paying Living Expenses: Not hard at all  Food Insecurity: No Food Insecurity (05/11/2022)   Hunger Vital Sign    Worried About Running Out of Food in the Last Year: Never true    Ran Out of Food in the Last Year: Never true  Transportation Needs: No Transportation Needs (05/11/2022)   PRAPARE - Administrator, Civil Service (Medical): No    Lack of Transportation (Non-Medical): No  Physical Activity:  Insufficiently Active (05/11/2022)   Exercise Vital Sign    Days of Exercise per Week: 3 days    Minutes of Exercise per Session: 30 min  Stress: No Stress Concern Present (05/11/2022)   [employer] of Occupational Health - Occupational Stress Questionnaire    Feeling of Stress : Not at all  Social Connections: Moderately Integrated (05/11/2022)   Social Connection and Isolation Panel [NHANES]    Frequency of Communication with Friends and Family: More than three times a week    Frequency of Social Gatherings with Friends and Family: More than three times a week    Attends Religious Services: More than 4 times per year    Active Member of [employer] or Organizations: No    Attends Banker Meetings: Never    Marital Status: Married        Objective:   Physical Exam Vitals reviewed.  Constitutional:      General: She is not in acute distress.    Appearance: She is well-developed. She is obese.  HENT:     Head: Normocephalic and atraumatic.     Right Ear: Tympanic membrane normal.     Left Ear: Tympanic membrane normal.  Eyes:     Pupils: Pupils are equal, round, and reactive to light.  Neck:     Thyroid: No thyromegaly.  Cardiovascular:     Rate and Rhythm: Normal rate and regular rhythm.     Heart sounds: Normal heart sounds. No murmur heard. Pulmonary:     Effort: Pulmonary effort is normal. No respiratory distress.     Breath sounds: Normal breath sounds. No wheezing.  Abdominal:     General: Bowel sounds are normal. There is no distension.     Palpations: Abdomen is soft.     Tenderness: There is no abdominal tenderness.  Musculoskeletal:        General: No tenderness. Normal range of motion.     Cervical back: Normal range of motion and neck supple.  Skin:    General: Skin is warm and dry.  Neurological:     Mental Status: She is alert and oriented to person, place, and time.     Cranial Nerves: No cranial nerve deficit.     Deep Tendon Reflexes:  Reflexes are normal and symmetric.  Psychiatric:        Behavior: Behavior normal.        Thought Content: Thought content normal.        Judgment: Judgment normal.       BP (!) 144/56   Pulse 64   Temp 98.7 F (37.1 C)   Ht 5\' 4"  (1.626 m)   Wt 147 lb 12.8 oz (67 kg)   SpO2 98%   BMI 25.37 kg/m      Assessment & Plan:  CAMIYAH STOPHEL comes in today with chief complaint of Medical Management of Chronic Issues   Diagnosis and orders addressed:  1. Annual physical exam - Bayer DCA Hb A1c Waived -  CBC with Differential/Platelet - CMP14+EGFR - Lipid panel - Microalbumin / creatinine urine ratio - ToxASSURE Select 13 (MW), Urine - VITAMIN D 25 Hydroxy (Vit-D Deficiency, Fractures)  2. Controlled substance agreement signed - CBC with Differential/Platelet - CMP14+EGFR - ToxASSURE Select 13 (MW), Urine - traMADol (ULTRAM) 50 MG tablet; Take 1 tablet (50 mg total) by mouth every 6 (six) hours as needed.  Dispense: 30 tablet; Refill: 2  3. Dementia with anxiety, unspecified dementia severity, unspecified dementia type (HCC) - CBC with Differential/Platelet - CMP14+EGFR - memantine (NAMENDA) 5 MG tablet; Take 1 tablet (5 mg total) by mouth 2 (two) times daily.  Dispense: 180 tablet; Refill: 2 - donepezil (ARICEPT) 5 MG tablet; Take 1 tablet (5 mg total) by mouth at bedtime.  Dispense: 90 tablet; Refill: 1  4. Diabetes mellitus due to underlying condition with hyperosmolarity without coma, without long-term current use of insulin (HCC) - Bayer DCA Hb A1c Waived - CBC with Differential/Platelet - CMP14+EGFR - Microalbumin / creatinine urine ratio  5. Hyperlipidemia associated with type 2 diabetes mellitus (HCC) - CBC with Differential/Platelet - CMP14+EGFR  6. Hypertension associated with diabetes (HCC) - CBC with Differential/Platelet - CMP14+EGFR  7. Mid back pain - CBC with Differential/Platelet - CMP14+EGFR - ToxASSURE Select 13 (MW), Urine - traMADol  (ULTRAM) 50 MG tablet; Take 1 tablet (50 mg total) by mouth every 6 (six) hours as needed.  Dispense: 30 tablet; Refill: 2  8. Overweight (BMI 25.0-29.9) - CBC with Differential/Platelet - CMP14+EGFR  9. Vitamin D deficiency - CBC with Differential/Platelet - CMP14+EGFR - VITAMIN D 25 Hydroxy (Vit-D Deficiency, Fractures)   Labs pending Start Namenda and aricept  Continue current medications  Patient reviewed in [place] controlled database, no flags noted. Contract and drug screen are up to date.  Health Maintenance reviewed Diet and exercise encouraged  Follow up plan: 3 months    Jannifer Rodney, FNP

## 2023-03-29 LAB — CMP14+EGFR
ALT: 10 [IU]/L (ref 0–32)
AST: 17 [IU]/L (ref 0–40)
Albumin: 4.7 g/dL (ref 3.9–4.9)
Alkaline Phosphatase: 40 [IU]/L — ABNORMAL LOW (ref 44–121)
BUN/Creatinine Ratio: 37 — ABNORMAL HIGH (ref 12–28)
BUN: 38 mg/dL — ABNORMAL HIGH (ref 8–27)
Bilirubin Total: 0.3 mg/dL (ref 0.0–1.2)
CO2: 26 mmol/L (ref 20–29)
Calcium: 9.6 mg/dL (ref 8.7–10.3)
Chloride: 102 mmol/L (ref 96–106)
Creatinine, Ser: 1.03 mg/dL — ABNORMAL HIGH (ref 0.57–1.00)
Globulin, Total: 2.1 g/dL (ref 1.5–4.5)
Glucose: 120 mg/dL — ABNORMAL HIGH (ref 70–99)
Potassium: 4.5 mmol/L (ref 3.5–5.2)
Sodium: 142 mmol/L (ref 134–144)
Total Protein: 6.8 g/dL (ref 6.0–8.5)
eGFR: 59 mL/min/{1.73_m2} — ABNORMAL LOW (ref >59)

## 2023-03-29 LAB — CBC WITH DIFFERENTIAL/PLATELET
Basophils Absolute: 0.1 10*3/uL (ref 0.0–0.2)
Basos: 1 %
EOS (ABSOLUTE): 0.3 10*3/uL (ref 0.0–0.4)
Eos: 3 %
Hematocrit: 45.1 % (ref 34.0–46.6)
Hemoglobin: 14.5 g/dL (ref 11.1–15.9)
Immature Grans (Abs): 0 10*3/uL (ref 0.0–0.1)
Immature Granulocytes: 0 %
Lymphocytes Absolute: 2.8 10*3/uL (ref 0.7–3.1)
Lymphs: 35 %
MCH: 32.3 pg (ref 26.6–33.0)
MCHC: 32.2 g/dL (ref 31.5–35.7)
MCV: 100 fL — ABNORMAL HIGH (ref 79–97)
Monocytes Absolute: 0.8 10*3/uL (ref 0.1–0.9)
Monocytes: 10 %
Neutrophils Absolute: 4.2 10*3/uL (ref 1.4–7.0)
Neutrophils: 51 %
Platelets: 223 10*3/uL (ref 150–450)
RBC: 4.49 x10E6/uL (ref 3.77–5.28)
RDW: 12.2 % (ref 11.7–15.4)
WBC: 8.2 10*3/uL (ref 3.4–10.8)

## 2023-03-29 LAB — VITAMIN D 25 HYDROXY (VIT D DEFICIENCY, FRACTURES): Vit D, 25-Hydroxy: 48.7 ng/mL (ref 30.0–100.0)

## 2023-03-29 LAB — LIPID PANEL
Chol/HDL Ratio: 2.1 ratio (ref 0.0–4.4)
Cholesterol, Total: 141 mg/dL (ref 100–199)
HDL: 66 mg/dL (ref >39)
LDL Chol Calc (NIH): 50 mg/dL (ref 0–99)
Triglycerides: 153 mg/dL — ABNORMAL HIGH (ref 0–149)
VLDL Cholesterol Cal: 25 mg/dL (ref 5–40)

## 2023-03-29 LAB — MICROALBUMIN / CREATININE URINE RATIO
Creatinine, Urine: 75.2 mg/dL
Microalb/Creat Ratio: <4 mg/g{creat} (ref 0–29)
Microalbumin, Urine: <3 ug/mL

## 2023-04-03 LAB — TOXASSURE SELECT 13 (MW), URINE

## 2023-04-13 ENCOUNTER — Other Ambulatory Visit: Payer: Self-pay | Admitting: Family

## 2023-04-13 DIAGNOSIS — I152 Hypertension secondary to endocrine disorders: Secondary | ICD-10-CM

## 2023-04-18 ENCOUNTER — Ambulatory Visit: Payer: Self-pay | Admitting: Family

## 2023-04-18 NOTE — Telephone Encounter (Signed)
Reason for Disposition . Lab or radiology calling with test results  Answer Assessment - Initial Assessment Questions 1. REASON FOR CALL or QUESTION: "What is your reason for calling today?" Reporting and abnormal Lab  from a house call visit  01/29/2023      2. CALLER: Document the source of call. (e.g., laboratory, patient).       [facility] House Call  Laboratory results --Recent House Call visit on 01/29/2023   Urine Creatine and EGFR was collected. The results were abnormal for both.  Result was mailed to  the office. They were calling to confirmed receipt.  Protocols used: PCP Call - No Triage-A-AH

## 2023-04-18 NOTE — Telephone Encounter (Addendum)
Copied from CRM [id number]. Topic: Clinical - Lab/Test Results >> Apr 18, 2023  8:29 AM Maxwell Marion wrote: Reason for CRM: Maralyn Sago calling from [employer] in regards to abnormal lab.   Chief Complaint:  Abnormal Labs- No one ever came to the line. Attempting to call back to the [employer] calls  Additional Notes:  RN Economist contacted [employer] calls and notified them Maralyn Sago called but did not come on the line to provide the abnormal lab. Requested a call back.   Contacted [employer] calls to obtain the abnormal  labs.   [employer]  House calls stated the labs were collected on 01/29/2023   Recent House Call visit on 01/29/2023   Urine Creatine and EGFR was collected. The results were abnormal for both.  Result was mailed to  the office. They were calling to confirmed receipt. R  Caller was Conservator, museum/gallery with [employer].

## 2023-04-20 ENCOUNTER — Other Ambulatory Visit: Payer: Self-pay | Admitting: Family

## 2023-04-20 DIAGNOSIS — I152 Hypertension secondary to endocrine disorders: Secondary | ICD-10-CM

## 2023-05-14 ENCOUNTER — Ambulatory Visit: Payer: Medicare Other

## 2023-05-14 VITALS — Ht 64.0 in | Wt 147.0 lb

## 2023-05-14 DIAGNOSIS — Z0000 Encounter for general adult medical examination without abnormal findings: Secondary | ICD-10-CM | POA: Diagnosis not present

## 2023-05-14 NOTE — Progress Notes (Signed)
Subjective:   Holly Price is a 72 y.o. female who presents for Medicare Annual (Subsequent) preventive examination.  Visit Complete: Virtual I connected with  Alison Stalling on 05/14/23 by a audio enabled telemedicine application and verified that I am speaking with the correct person using two identifiers.  Patient Location: Home  Provider Location: Home Office  I discussed the limitations of evaluation and management by telemedicine. The patient expressed understanding and agreed to proceed.  Vital Signs: Because this visit was a virtual/telehealth visit, some criteria may be missing or patient reported. Any vitals not documented were not able to be obtained and vitals that have been documented are patient reported.  Cardiac Risk Factors include: advanced age (>78men, >9 women);diabetes mellitus;hypertension;sedentary lifestyle     Objective:    Today's Vitals   05/14/23 1507  Weight: 147 lb (66.7 kg)  Height: 5\' 4"  (1.626 m)   Body mass index is 25.23 kg/m.     05/14/2023    3:13 PM 11/26/2022    7:28 AM 05/10/2021    4:31 PM 05/04/2020    2:55 PM 10/14/2019    7:30 AM 10/07/2018    9:41 AM 07/14/2018   10:30 AM  Advanced Directives  Does Patient Have a Medical Advance Directive? No No No No No No No  Would patient like information on creating a medical advance directive? Yes (MAU/Ambulatory/Procedural Areas - Information given) No - Patient declined No - Patient declined No - Patient declined Yes (MAU/Ambulatory/Procedural Areas - Information given) No - Patient declined No - Patient declined    Current Medications (verified) Outpatient Encounter Medications as of 05/14/2023  Medication Sig   amLODipine (NORVASC) 5 MG tablet TAKE ONE (1) TABLET BY MOUTH EVERY DAY   atenolol (TENORMIN) 50 MG tablet TAKE ONE (1) TABLET BY MOUTH EVERY DAY   baclofen (LIORESAL) 10 MG tablet TAKE 1/2 TABLET BY MOUTH 3 TIMES DAILY   Calcium Citrate-Vitamin D (CALCIUM + D PO) Take 1  tablet by mouth daily.   donepezil (ARICEPT) 5 MG tablet Take 1 tablet (5 mg total) by mouth at bedtime.   lisinopril-hydrochlorothiazide (ZESTORETIC) 10-12.5 MG tablet Take 1 tablet by mouth daily.   meloxicam (MOBIC) 15 MG tablet TAKE ONE (1) TABLET BY MOUTH EVERY DAY   memantine (NAMENDA) 5 MG tablet Take 1 tablet (5 mg total) by mouth 2 (two) times daily.   Na Sulfate-K Sulfate-Mg Sulf 17.5-3.13-1.6 GM/177ML SOLN As directed   rosuvastatin (CRESTOR) 5 MG tablet TAKE ONE (1) TABLET BY MOUTH EVERY DAY   traMADol (ULTRAM) 50 MG tablet Take 1 tablet (50 mg total) by mouth every 6 (six) hours as needed.   zinc gluconate 50 MG tablet Take 50 mg by mouth daily.   No facility-administered encounter medications on file as of 05/14/2023.    Allergies (verified) Patient has no known allergies.   History: Past Medical History:  Diagnosis Date   Anxiety    COPD (chronic obstructive pulmonary disease) (HCC)    DDD (degenerative disc disease)    Depression    Diabetes mellitus without complication (HCC)    DVT (deep venous thrombosis) (HCC) 2017   stopped Xarelto in Feb 2020   Fibromyalgia    GERD (gastroesophageal reflux disease)    Hypertension    Vitamin D deficiency disease    Past Surgical History:  Procedure Laterality Date   ABDOMINAL HYSTERECTOMY     BREAST BIOPSY Right 09/01/2021   BREAST BIOPSY Left    CHOLECYSTECTOMY  COLONOSCOPY N/A 10/14/2019   Procedure: COLONOSCOPY;  Surgeon: Corbin Ade, MD;  [facility];  Service: Endoscopy;  Laterality: N/A;  8:30am   COLONOSCOPY WITH PROPOFOL N/A 11/26/2022   Procedure: COLONOSCOPY WITH PROPOFOL;  Surgeon: Corbin Ade, MD;  Location: [facility];  Service: Endoscopy;  Laterality: N/A;  845am, asa 2   FOOT SURGERY Bilateral    HAND SURGERY Bilateral    POLYPECTOMY  10/14/2019   Procedure: POLYPECTOMY;  Surgeon: Corbin Ade, MD;  Location: [facility];  Service: Endoscopy;;   SPINE SURGERY      TONSILLECTOMY     Family History  Problem Relation Age of Onset   Cancer Mother    Heart attack Father    Hypertension Son    Heart attack Sister    Colon cancer Neg Hx    Social History   Socioeconomic History   Marital status: Married    Spouse name: Verdon Cummins    Number of children: 1   Years of education: 11th   Highest education level: 11th grade  Occupational History   Occupation: Retired     Comment: CNA  Tobacco Use   Smoking status: Former    Current packs/day: 0.00    Average packs/day: 0.5 packs/day for 10.0 years (5.0 ttl pk-yrs)    Types: Cigarettes    Start date: 06/22/2002    Quit date: 06/22/2012    Years since quitting: 10.8   Smokeless tobacco: Never  Vaping Use   Vaping status: Never Used  Substance and Sexual Activity   Alcohol use: Not Currently   Drug use: Not Currently   Sexual activity: Not Currently  Other Topics Concern   Not on file  Social History Narrative   Lives with husband of over 40 years   Son lives on the same street.   Social Drivers of Corporate investment banker Strain: Low Risk  (05/14/2023)   Overall Financial Resource Strain (CARDIA)    Difficulty of Paying Living Expenses: Not hard at all  Food Insecurity: No Food Insecurity (05/14/2023)   Hunger Vital Sign    Worried About Running Out of Food in the Last Year: Never true    Ran Out of Food in the Last Year: Never true  Transportation Needs: No Transportation Needs (05/14/2023)   PRAPARE - Administrator, Civil Service (Medical): No    Lack of Transportation (Non-Medical): No  Physical Activity: Insufficiently Active (05/14/2023)   Exercise Vital Sign    Days of Exercise per Week: 3 days    Minutes of Exercise per Session: 30 min  Stress: No Stress Concern Present (05/14/2023)   [employer] of Occupational Health - Occupational Stress Questionnaire    Feeling of Stress : Not at all  Social Connections: Moderately Integrated (05/14/2023)   Social  Connection and Isolation Panel [NHANES]    Frequency of Communication with Friends and Family: More than three times a week    Frequency of Social Gatherings with Friends and Family: Three times a week    Attends Religious Services: More than 4 times per year    Active Member of Clubs or Organizations: No    Attends Banker Meetings: Never    Marital Status: Married    Tobacco Counseling Counseling given: Not Answered   Clinical Intake:  Pre-visit preparation completed: Yes  Pain : No/denies pain     Diabetes: Yes CBG done?: No Did pt. bring in CBG monitor from home?:  No  How often do you need to have someone help you when you read instructions, pamphlets, or other written materials from your doctor or pharmacy?: 1 - Never  Interpreter Needed?: No  Comments: Assisted with visit by husband Information entered by :: Kandis Fantasia LPN   Activities of Daily Living    05/14/2023    3:12 PM  In your present state of health, do you have any difficulty performing the following activities:  Hearing? 0  Vision? 0  Difficulty concentrating or making decisions? 1  Walking or climbing stairs? 0  Dressing or bathing? 0  Doing errands, shopping? 1  Preparing Food and eating ? N  Using the Toilet? N  In the past six months, have you accidently leaked urine? N  Do you have problems with loss of bowel control? N  Managing your Medications? Y  Managing your Finances? Y  Housekeeping or managing your Housekeeping? N    Patient Care Team: Junie Spencer, FNP as PCP - General (Nurse Practitioner) Danella Maiers, [facility] as Pharmacist (Family Medicine)  Indicate any recent Medical Services you may have received from other than [facility] providers in the past year (date may be approximate).     Assessment:   This is a routine wellness examination for Holly Price.  Hearing/Vision screen Hearing Screening - Comments:: Denies hearing difficulties   Vision Screening -  Comments:: Wears rx glasses - up to date with routine eye exams with [facility]     Goals Addressed             This Visit's Progress    COMPLETED: AWV       05/04/2020 AWV Goal: Diabetes Management  Patient will maintain an A1C level below 8.0 Patient will not develop any diabetic foot complications Patient will not experience any hypoglycemic episodes over the next 3 months Patient will notify our office of any CBG readings outside of the provider recommended range by calling [phone] Patient will adhere to provider recommendations for diabetes management  Patient Self Management Activities take all medications as prescribed and report any negative side effects monitor and record blood sugar readings as directed adhere to a low carbohydrate diet that incorporates lean proteins, vegetables, whole grains, low glycemic fruits check feet daily noting any sores, cracks, injuries, or callous formations see PCP or podiatrist if she notices any changes in her legs, feet, or toenails Patient will visit PCP and have an A1C level checked every 3 to 6 months as directed  have a yearly eye exam to monitor for vascular changes associated with diabetes and will request that the report be sent to her pcp.  consult with her PCP regarding any changes in her health or new or worsening symptoms   05/04/2020 AWV Goal: Exercise for General Health  Patient will verbalize understanding of the benefits of increased physical activity: Exercising regularly is important. It will improve your overall fitness, flexibility, and endurance. Regular exercise also will improve your overall health. It can help you control your weight, reduce stress, and improve your bone density. Over the next year, patient will increase physical activity as tolerated with a goal of at least 150 minutes of moderate physical activity per week.  You can tell that you are exercising at a moderate intensity if your heart starts  beating faster and you start breathing faster but can still hold a conversation. Moderate-intensity exercise ideas include: Walking 1 mile (1.6 km) in about 15 minutes Biking Hiking Golfing Dancing Water aerobics Patient  will verbalize understanding of everyday activities that increase physical activity by providing examples like the following: Yard work, such as: Insurance underwriter Gardening Washing windows or floors Patient will be able to explain general safety guidelines for exercising:  Before you start a new exercise program, talk with your health care provider. Do not exercise so much that you hurt yourself, feel dizzy, or get very short of breath. Wear comfortable clothes and wear shoes with good support. Drink plenty of water while you exercise to prevent dehydration or heat stroke. Work out until your breathing and your heartbeat get faster.       Depression Screen    05/14/2023    3:11 PM 03/28/2023    2:48 PM 10/02/2022    8:49 AM 03/05/2022    8:57 AM 11/30/2021   11:09 AM 05/10/2021    4:29 PM 03/14/2021   10:32 AM  PHQ 2/9 Scores  PHQ - 2 Score 0 0 0 0 0 0 0  PHQ- 9 Score   0 0  0 0    Fall Risk    05/14/2023    3:12 PM 03/28/2023    2:48 PM 10/02/2022    8:49 AM 05/11/2022    3:14 PM 11/30/2021   11:09 AM  Fall Risk   Falls in the past year? 0 0 0 0 0  Number falls in past yr: 0  0 0   Injury with Fall? 0  0 0   Risk for fall due to : No Fall Risks  History of fall(s) No Fall Risks   Follow up Falls prevention discussed;Education provided;Falls evaluation completed  Falls evaluation completed Falls prevention discussed     MEDICARE RISK AT HOME: Medicare Risk at Home Any stairs in or around the home?: No If so, are there any without handrails?: No Home free of loose throw rugs in walkways, pet beds, electrical cords, etc?: Yes Adequate lighting in your home to reduce risk  of falls?: Yes Life alert?: No Use of a cane, walker or w/c?: No Grab bars in the bathroom?: Yes Shower chair or bench in shower?: No Elevated toilet seat or a handicapped toilet?: Yes  TIMED UP AND GO:  Was the test performed?  No    Cognitive Function: current diagnosis of dementia taking Aricept     05/04/2019    2:36 PM  MMSE - Mini Mental State Exam  Orientation to time 3  Orientation to Place 5  Registration 3  Attention/ Calculation 5  Recall 0  Language- name 2 objects 2  Language- repeat 1  Language- follow 3 step command 3  Language- read & follow direction 1  Write a sentence 1  Copy design 1  Total score 25        05/14/2023    3:13 PM 05/11/2022    3:16 PM 05/10/2021    4:36 PM 05/04/2020    2:59 PM  6CIT Screen  What Year? 0 points 0 points 0 points 0 points  What month? 0 points 0 points 0 points 0 points  What time? 0 points 0 points 0 points 0 points  Count back from 20 0 points 0 points 0 points 0 points  Months in reverse 2 points 0 points 2 points 2 points  Repeat phrase 4 points 2 points 4 points 0 points  Total Score 6 points 2 points 6 points 2 points    Immunizations  Immunization History  Administered Date(s) Administered   Fluad Quad(high Dose 65+) 04/01/2020, 03/14/2021, 03/05/2022   Fluad Trivalent(High Dose 65+) 03/28/2023   Influenza, High Dose Seasonal PF 06/16/2018   Influenza,inj,Quad PF,6+ Mos 02/25/2013, 02/24/2014, 04/15/2015   Moderna Sars-Covid-2 Vaccination 07/22/2019, 08/19/2019   Pneumococcal Conjugate-13 02/15/2014   Pneumococcal Polysaccharide-23 04/29/2012, 06/16/2018   Tdap 04/29/2012, 10/02/2022   Zoster Recombinant(Shingrix) 12/01/2020, 06/16/2021   Zoster, Live 11/02/2013    TDAP status: Up to date  Flu Vaccine status: Up to date  Pneumococcal vaccine status: Up to date  Covid-19 vaccine status: Information provided on how to obtain vaccines.   Qualifies for Shingles Vaccine? Yes   Zostavax completed  Yes   Shingrix Completed?: Yes  Screening Tests Health Maintenance  Topic Date Due   COVID-19 Vaccine (3 - Moderna risk series) 09/16/2019   DEXA SCAN  06/24/2023   MAMMOGRAM  09/04/2023   HEMOGLOBIN A1C  09/25/2023   FOOT EXAM  10/02/2023   OPHTHALMOLOGY EXAM  03/04/2024   Diabetic kidney evaluation - eGFR measurement  03/27/2024   Diabetic kidney evaluation - Urine ACR  03/27/2024   Medicare Annual Wellness (AWV)  05/13/2024   DTaP/Tdap/Td (3 - Td or Tdap) 10/01/2032   Colonoscopy  11/25/2032   Pneumonia Vaccine 30+ Years old  Completed   INFLUENZA VACCINE  Completed   Hepatitis C Screening  Completed   Zoster Vaccines- Shingrix  Completed   HPV VACCINES  Aged Out    Health Maintenance  Health Maintenance Due  Topic Date Due   COVID-19 Vaccine (3 - Moderna risk series) 09/16/2019    Colorectal cancer screening: Type of screening: Colonoscopy. Completed 11/26/22. Repeat every 10 years  Mammogram status: Completed 09/04/22. Repeat every year  Bone Density status: Completed 06/23/18. Results reflect: Bone density results: NORMAL. Repeat every 5 years.  Lung Cancer Screening: (Low Dose CT Chest recommended if Age 74-80 years, 20 pack-year currently smoking OR have quit w/in 15years.) does not qualify.   Lung Cancer Screening Referral: n/a  Additional Screening:  Hepatitis C Screening: does qualify; Completed 11/22/15  Vision Screening: Recommended annual ophthalmology exams for early detection of glaucoma and other disorders of the eye. Is the patient up to date with their annual eye exam?  Yes  Who is the provider or what is the name of the office in which the patient attends annual eye exams? [facility]  If pt is not established with a provider, would they like to be referred to a provider to establish care? No .   Dental Screening: Recommended annual dental exams for proper oral hygiene  Diabetic Foot Exam: Diabetic Foot Exam: Completed 10/02/22  Community Resource  Referral / Chronic Care Management: CRR required this visit?  No   CCM required this visit?  No     Plan:     I have personally reviewed and noted the following in the patient's chart:   Medical and social history Use of alcohol, tobacco or illicit drugs  Current medications and supplements including opioid prescriptions. Patient is not currently taking opioid prescriptions. Functional ability and status Nutritional status Physical activity Advanced directives List of other physicians Hospitalizations, surgeries, and ER visits in previous 12 months Vitals Screenings to include cognitive, depression, and falls Referrals and appointments  In addition, I have reviewed and discussed with patient certain preventive protocols, quality metrics, and best practice recommendations. A written personalized care plan for preventive services as well as general preventive health recommendations were provided to patient.  Kandis Fantasia [place], [place]   [id number]/[id number]   After Visit Summary: (MyChart) Due to this being a telephonic visit, the after visit summary with patients personalized plan was offered to patient via MyChart   Nurse Notes: No concerns at this time

## 2023-05-14 NOTE — Patient Instructions (Signed)
Ms. Waligora , Thank you for taking time to come for your Medicare Wellness Visit. I appreciate your ongoing commitment to your health goals. Please review the following plan we discussed and let me know if I can assist you in the future.   Referrals/Orders/Follow-Ups/Clinician Recommendations: Aim for 30 minutes of exercise or brisk walking, 6-8 glasses of water, and 5 servings of fruits and vegetables each day.  This is a list of the screening recommended for you and due dates:  Health Maintenance  Topic Date Due   COVID-19 Vaccine (3 - Moderna risk series) 09/16/2019   DEXA scan (bone density measurement)  06/24/2023   Mammogram  09/04/2023   Hemoglobin A1C  09/25/2023   Complete foot exam   10/02/2023   Eye exam for diabetics  03/04/2024   Yearly kidney function blood test for diabetes  03/27/2024   Yearly kidney health urinalysis for diabetes  03/27/2024   Medicare Annual Wellness Visit  05/13/2024   DTaP/Tdap/Td vaccine (3 - Td or Tdap) 10/01/2032   Colon Cancer Screening  11/25/2032   Pneumonia Vaccine  Completed   Flu Shot  Completed   Hepatitis C Screening  Completed   Zoster (Shingles) Vaccine  Completed   HPV Vaccine  Aged Out    Advanced directives: (ACP Link)Information on Advanced Care Planning can be found at [facility] Advance Health Care Directives Advance Health Care Directives ([email or web address]   Next Medicare Annual Wellness Visit scheduled for next year: Yes

## 2023-05-24 ENCOUNTER — Other Ambulatory Visit: Payer: Self-pay | Admitting: Family

## 2023-05-24 DIAGNOSIS — M549 Dorsalgia, unspecified: Secondary | ICD-10-CM

## 2023-06-10 ENCOUNTER — Other Ambulatory Visit: Payer: Self-pay | Admitting: Family

## 2023-06-10 DIAGNOSIS — M549 Dorsalgia, unspecified: Secondary | ICD-10-CM

## 2023-06-10 DIAGNOSIS — M961 Postlaminectomy syndrome, not elsewhere classified: Secondary | ICD-10-CM

## 2023-06-10 DIAGNOSIS — E1159 Type 2 diabetes mellitus with other circulatory complications: Secondary | ICD-10-CM

## 2023-06-12 ENCOUNTER — Other Ambulatory Visit: Payer: Self-pay | Admitting: Family

## 2023-06-12 DIAGNOSIS — I152 Hypertension secondary to endocrine disorders: Secondary | ICD-10-CM

## 2023-06-21 ENCOUNTER — Other Ambulatory Visit: Payer: Self-pay | Admitting: Family

## 2023-06-21 DIAGNOSIS — F0394 Unspecified dementia, unspecified severity, with anxiety: Secondary | ICD-10-CM

## 2023-06-28 ENCOUNTER — Ambulatory Visit: Payer: Medicare Other | Admitting: Family

## 2023-07-05 ENCOUNTER — Other Ambulatory Visit: Payer: Self-pay | Admitting: Family

## 2023-07-05 ENCOUNTER — Ambulatory Visit (INDEPENDENT_AMBULATORY_CARE_PROVIDER_SITE_OTHER): Payer: Medicare Other | Admitting: Family

## 2023-07-05 ENCOUNTER — Other Ambulatory Visit: Payer: Self-pay

## 2023-07-05 ENCOUNTER — Encounter: Payer: Self-pay | Admitting: Family

## 2023-07-05 ENCOUNTER — Telehealth: Payer: Self-pay | Admitting: *Deleted

## 2023-07-05 ENCOUNTER — Ambulatory Visit (INDEPENDENT_AMBULATORY_CARE_PROVIDER_SITE_OTHER): Payer: Medicare Other

## 2023-07-05 VITALS — BP 162/60 | Temp 98.7°F | Ht 64.0 in

## 2023-07-05 DIAGNOSIS — Z780 Asymptomatic menopausal state: Secondary | ICD-10-CM

## 2023-07-05 DIAGNOSIS — E559 Vitamin D deficiency, unspecified: Secondary | ICD-10-CM | POA: Diagnosis not present

## 2023-07-05 DIAGNOSIS — E1159 Type 2 diabetes mellitus with other circulatory complications: Secondary | ICD-10-CM

## 2023-07-05 DIAGNOSIS — E1169 Type 2 diabetes mellitus with other specified complication: Secondary | ICD-10-CM

## 2023-07-05 DIAGNOSIS — E1142 Type 2 diabetes mellitus with diabetic polyneuropathy: Secondary | ICD-10-CM

## 2023-07-05 DIAGNOSIS — F0394 Unspecified dementia, unspecified severity, with anxiety: Secondary | ICD-10-CM

## 2023-07-05 DIAGNOSIS — M85852 Other specified disorders of bone density and structure, left thigh: Secondary | ICD-10-CM | POA: Diagnosis not present

## 2023-07-05 DIAGNOSIS — E0800 Diabetes mellitus due to underlying condition with hyperosmolarity without nonketotic hyperglycemic-hyperosmolar coma (NKHHC): Secondary | ICD-10-CM

## 2023-07-05 DIAGNOSIS — E663 Overweight: Secondary | ICD-10-CM | POA: Diagnosis not present

## 2023-07-05 DIAGNOSIS — I152 Hypertension secondary to endocrine disorders: Secondary | ICD-10-CM | POA: Diagnosis not present

## 2023-07-05 DIAGNOSIS — E785 Hyperlipidemia, unspecified: Secondary | ICD-10-CM

## 2023-07-05 DIAGNOSIS — M25552 Pain in left hip: Secondary | ICD-10-CM | POA: Diagnosis not present

## 2023-07-05 DIAGNOSIS — M160 Bilateral primary osteoarthritis of hip: Secondary | ICD-10-CM | POA: Diagnosis not present

## 2023-07-05 DIAGNOSIS — M8580 Other specified disorders of bone density and structure, unspecified site: Secondary | ICD-10-CM | POA: Insufficient documentation

## 2023-07-05 DIAGNOSIS — M47817 Spondylosis without myelopathy or radiculopathy, lumbosacral region: Secondary | ICD-10-CM

## 2023-07-05 DIAGNOSIS — M85851 Other specified disorders of bone density and structure, right thigh: Secondary | ICD-10-CM | POA: Diagnosis not present

## 2023-07-05 LAB — BAYER DCA HB A1C WAIVED: HB A1C (BAYER DCA - WAIVED): 5.5 % (ref 4.8–5.6)

## 2023-07-05 MED ORDER — LISINOPRIL-HYDROCHLOROTHIAZIDE 20-12.5 MG PO TABS
1.0000 | ORAL_TABLET | Freq: Every day | ORAL | 3 refills | Status: DC
Start: 2023-07-05 — End: 2024-03-16

## 2023-07-05 NOTE — Patient Instructions (Signed)

## 2023-07-05 NOTE — Progress Notes (Signed)
 Patient was having DXA scan and c/o left hip pain - hip xray obtained today

## 2023-07-05 NOTE — Progress Notes (Signed)
 Subjective:    Patient ID: Holly Price, female    DOB: 04-Feb-1951, 73 y.o.   MRN: [id number]  Chief Complaint  Patient presents with   Medical Management of Chronic Issues   PT presents to the office today for chronic follow up. She has a hx of DVT and was taken off her xarelto  in February 2020.    She reports her memory is stable, but admits to being forgetful at times. Family is with her and states her memory is worse.  Hypertension This is a chronic problem. The current episode started more than 1 year ago. The problem has been waxing and waning since onset. The problem is uncontrolled. Associated symptoms include blurred vision. Pertinent negatives include no malaise/fatigue, peripheral edema or shortness of breath. Risk factors for coronary artery disease include dyslipidemia, diabetes mellitus, obesity and sedentary lifestyle. The current treatment provides moderate improvement.  Hyperlipidemia This is a chronic problem. The current episode started more than 1 year ago. The problem is controlled. Recent lipid tests were reviewed and are normal. Exacerbating diseases include obesity. Pertinent negatives include no shortness of breath. Current antihyperlipidemic treatment includes statins. The current treatment provides moderate improvement of lipids. Risk factors for coronary artery disease include dyslipidemia, diabetes mellitus, a sedentary lifestyle, hypertension and post-menopausal.  Diabetes She presents for her follow-up diabetic visit. She has type 2 diabetes mellitus. Associated symptoms include blurred vision and foot paresthesias. Diabetic complications include peripheral neuropathy. Risk factors for coronary artery disease include dyslipidemia, diabetes mellitus, hypertension, sedentary lifestyle and post-menopausal. She is following a generally unhealthy diet. (Unsure what BS are at home)  Back Pain This is a chronic problem. The current episode started more than 1 year ago.  The problem occurs intermittently. The problem has been waxing and waning since onset. The pain is present in the lumbar spine. The pain is at a severity of 5/10. The pain is moderate. The symptoms are aggravated by twisting and bending. Risk factors include obesity. She has tried analgesics for the symptoms. The treatment provided mild relief.  Arthritis Presents for follow-up visit. She complains of pain and stiffness. Affected locations include the left hip. Her pain is at a severity of 7/10.       Review of Systems  Constitutional:  Negative for malaise/fatigue.  Eyes:  Positive for blurred vision.  Respiratory:  Negative for shortness of breath.   Musculoskeletal:  Positive for arthritis, back pain and stiffness.  All other systems reviewed and are negative.  Family History  Problem Relation Age of Onset   Cancer Mother    Heart attack Father    Hypertension Son    Heart attack Sister    Colon cancer Neg Hx    Social History   Socioeconomic History   Marital status: Married    Spouse name: Josefa    Number of children: 1   Years of education: 11th   Highest education level: 11th grade  Occupational History   Occupation: Retired     Comment: CNA  Tobacco Use   Smoking status: Former    Current packs/day: 0.00    Average packs/day: 0.5 packs/day for 10.0 years (5.0 ttl pk-yrs)    Types: Cigarettes    Start date: 06/22/2002    Quit date: 06/22/2012    Years since quitting: 11.0   Smokeless tobacco: Never  Vaping Use   Vaping status: Never Used  Substance and Sexual Activity   Alcohol use: Not Currently   Drug use: Not Currently  Sexual activity: Not Currently  Other Topics Concern   Not on file  Social History Narrative   Lives with husband of over 40 years   Son lives on the same street.   Social Drivers of Corporate Investment Banker Strain: Low Risk  (05/14/2023)   Overall Financial Resource Strain (CARDIA)    Difficulty of Paying Living Expenses: Not  hard at all  Food Insecurity: No Food Insecurity (05/14/2023)   Hunger Vital Sign    Worried About Running Out of Food in the Last Year: Never true    Ran Out of Food in the Last Year: Never true  Transportation Needs: No Transportation Needs (05/14/2023)   PRAPARE - Administrator, Civil Service (Medical): No    Lack of Transportation (Non-Medical): No  Physical Activity: Insufficiently Active (05/14/2023)   Exercise Vital Sign    Days of Exercise per Week: 3 days    Minutes of Exercise per Session: 30 min  Stress: No Stress Concern Present (05/14/2023)   [employer] of Occupational Health - Occupational Stress Questionnaire    Feeling of Stress : Not at all  Social Connections: Moderately Integrated (05/14/2023)   Social Connection and Isolation Panel [NHANES]    Frequency of Communication with Friends and Family: More than three times a week    Frequency of Social Gatherings with Friends and Family: Three times a week    Attends Religious Services: More than 4 times per year    Active Member of Clubs or Organizations: No    Attends Banker Meetings: Never    Marital Status: Married        Objective:   Physical Exam Vitals reviewed.  Constitutional:      General: She is not in acute distress.    Appearance: She is well-developed. She is obese.  HENT:     Head: Normocephalic and atraumatic.     Right Ear: Tympanic membrane normal.     Left Ear: Tympanic membrane normal.  Eyes:     Pupils: Pupils are equal, round, and reactive to light.  Neck:     Thyroid : No thyromegaly.  Cardiovascular:     Rate and Rhythm: Normal rate and regular rhythm.     Heart sounds: Normal heart sounds. No murmur heard. Pulmonary:     Effort: Pulmonary effort is normal. No respiratory distress.     Breath sounds: Normal breath sounds. No wheezing.  Abdominal:     General: Bowel sounds are normal. There is no distension.     Palpations: Abdomen is soft.      Tenderness: There is no abdominal tenderness.  Musculoskeletal:        General: No tenderness. Normal range of motion.     Cervical back: Normal range of motion and neck supple.     Right lower leg: Edema (trace) present.     Left lower leg: Edema (trace) present.  Skin:    General: Skin is warm and dry.  Neurological:     Mental Status: She is alert and oriented to person, place, and time.     Cranial Nerves: No cranial nerve deficit.     Deep Tendon Reflexes: Reflexes are normal and symmetric.  Psychiatric:        Behavior: Behavior normal.        Thought Content: Thought content normal.        Judgment: Judgment normal.       BP (!) 162/60   Temp 98.7 F (  37.1 C)   Ht 5' 4 (1.626 m)   SpO2 96%   BMI 25.23 kg/m      Assessment & Plan:  Holly Price comes in today with chief complaint of Medical Management of Chronic Issues   Diagnosis and orders addressed: 1. Diabetes mellitus due to underlying condition with hyperosmolarity without coma, without long-term current use of insulin (HCC) (Primary) - Bayer DCA Hb A1c Waived - CMP14+EGFR  2. Hyperlipidemia associated with type 2 diabetes mellitus (HCC) - CMP14+EGFR  3. Hypertension associated with diabetes (HCC) Will increase lisinopril  to 20 mg from 10 mg -Daily blood pressure log given with instructions on how to fill out and told to bring to next visit -Dash diet information given -Exercise encouraged - Stress Management  -Continue current meds -RTO in 2 weeks - CMP14+EGFR - lisinopril -hydrochlorothiazide  (ZESTORETIC ) 20-12.5 MG tablet; Take 1 tablet by mouth daily.  Dispense: 90 tablet; Refill: 3  4. Overweight (BMI 25.0-29.9) - CMP14+EGFR  5. Vitamin D  deficiency - CMP14+EGFR  6. Dementia with anxiety, unspecified dementia severity, unspecified dementia type (HCC) - CMP14+EGFR  7. Diabetic polyneuropathy associated with type 2 diabetes mellitus (HCC) - CMP14+EGFR  8. Lumbosacral spondylosis  without myelopathy - CMP14+EGFR  9. Pain of left hip - CMP14+EGFR  10. Post-menopause - CMP14+EGFR - DG [facility] DEXA   Labs pending Will increase lisinopril  to 20 mg from 10 mg  Add tylenol  with mobic   Continue current medications  Patient reviewed in [place] controlled database, no flags noted. Contract and drug screen are up to date.  Health Maintenance reviewed Diet and exercise encouraged  Follow up plan: 2 weeks to recheck HTN   Bari Learn, FNP

## 2023-07-05 NOTE — Telephone Encounter (Signed)
 Patient would like to know if she is a candidate for Dexcom CGM. She does not have a smart phone

## 2023-07-06 LAB — CMP14+EGFR
ALT: 14 [IU]/L (ref 0–32)
AST: 20 [IU]/L (ref 0–40)
Albumin: 4.3 g/dL (ref 3.9–4.9)
Alkaline Phosphatase: 47 [IU]/L (ref 44–121)
BUN/Creatinine Ratio: 19 (ref 12–28)
BUN: 16 mg/dL (ref 8–27)
Bilirubin Total: 0.3 mg/dL (ref 0.0–1.2)
CO2: 24 mmol/L (ref 20–29)
Calcium: 9.2 mg/dL (ref 8.7–10.3)
Chloride: 104 mmol/L (ref 96–106)
Creatinine, Ser: 0.83 mg/dL (ref 0.57–1.00)
Globulin, Total: 2.4 g/dL (ref 1.5–4.5)
Glucose: 95 mg/dL (ref 70–99)
Potassium: 4.8 mmol/L (ref 3.5–5.2)
Sodium: 144 mmol/L (ref 134–144)
Total Protein: 6.7 g/dL (ref 6.0–8.5)
eGFR: 76 mL/min/{1.73_m2} (ref >59)

## 2023-07-23 ENCOUNTER — Ambulatory Visit: Payer: Medicare Other | Admitting: Family

## 2023-08-07 ENCOUNTER — Other Ambulatory Visit: Payer: Self-pay | Admitting: Family

## 2023-08-07 DIAGNOSIS — Z1231 Encounter for screening mammogram for malignant neoplasm of breast: Secondary | ICD-10-CM

## 2023-08-09 ENCOUNTER — Other Ambulatory Visit: Payer: Self-pay | Admitting: Family

## 2023-08-19 ENCOUNTER — Other Ambulatory Visit: Payer: Self-pay | Admitting: Family

## 2023-08-19 DIAGNOSIS — M549 Dorsalgia, unspecified: Secondary | ICD-10-CM

## 2023-09-09 ENCOUNTER — Other Ambulatory Visit: Payer: Self-pay | Admitting: Family

## 2023-09-09 DIAGNOSIS — F0394 Unspecified dementia, unspecified severity, with anxiety: Secondary | ICD-10-CM

## 2023-09-11 ENCOUNTER — Ambulatory Visit
Admission: RE | Admit: 2023-09-11 | Discharge: 2023-09-11 | Disposition: A | Discharge status: Home / Self Care | Source: Ambulatory Visit | Attending: Family | Admitting: Family

## 2023-09-11 DIAGNOSIS — Z1231 Encounter for screening mammogram for malignant neoplasm of breast: Secondary | ICD-10-CM

## 2023-09-20 ENCOUNTER — Other Ambulatory Visit: Payer: Self-pay | Admitting: Family

## 2023-09-20 DIAGNOSIS — F0394 Unspecified dementia, unspecified severity, with anxiety: Secondary | ICD-10-CM

## 2023-10-08 ENCOUNTER — Other Ambulatory Visit: Payer: Self-pay | Admitting: Family

## 2023-10-08 DIAGNOSIS — E1159 Type 2 diabetes mellitus with other circulatory complications: Secondary | ICD-10-CM

## 2023-11-15 ENCOUNTER — Other Ambulatory Visit: Payer: Self-pay | Admitting: Family

## 2023-11-15 DIAGNOSIS — M549 Dorsalgia, unspecified: Secondary | ICD-10-CM

## 2023-12-06 ENCOUNTER — Other Ambulatory Visit: Payer: Self-pay | Admitting: Family

## 2023-12-06 DIAGNOSIS — F0394 Unspecified dementia, unspecified severity, with anxiety: Secondary | ICD-10-CM

## 2023-12-13 ENCOUNTER — Other Ambulatory Visit: Payer: Self-pay | Admitting: Family

## 2023-12-13 DIAGNOSIS — M549 Dorsalgia, unspecified: Secondary | ICD-10-CM

## 2023-12-13 DIAGNOSIS — F0394 Unspecified dementia, unspecified severity, with anxiety: Secondary | ICD-10-CM

## 2023-12-13 DIAGNOSIS — M961 Postlaminectomy syndrome, not elsewhere classified: Secondary | ICD-10-CM

## 2023-12-13 NOTE — Telephone Encounter (Signed)
 I called daughter & made her an appt 12-30-2023 w/[facility] Jom[facility]). It was 1st available bc Lavell is going on vacation. Please refill bc mom has dementia per daughter. I told her to make sure her mom either calls back for return appt, when she has a video visit or stop by check-out to make f/u appt's w/[facility], so she will not run out of her meds.

## 2023-12-13 NOTE — Telephone Encounter (Signed)
 Christy pt NTBS 30-d given 09/20/23

## 2023-12-16 MED ORDER — BACLOFEN 10 MG PO TABS: ORAL_TABLET | ORAL | 0 refills | Status: DC

## 2023-12-16 MED ORDER — DONEPEZIL HCL 5 MG PO TABS
5.0000 mg | ORAL_TABLET | Freq: Every day | ORAL | 0 refills | Status: DC
Start: 2023-12-16 — End: 2023-12-30

## 2023-12-16 NOTE — Addendum Note (Signed)
 Addended by: Ixel Boehning D on: 12/16/2023 08:39 AM   Modules accepted: Orders

## 2023-12-30 ENCOUNTER — Ambulatory Visit

## 2023-12-30 ENCOUNTER — Ambulatory Visit (INDEPENDENT_AMBULATORY_CARE_PROVIDER_SITE_OTHER): Admitting: Family

## 2023-12-30 ENCOUNTER — Encounter: Payer: Self-pay | Admitting: Family

## 2023-12-30 VITALS — BP 166/71 | HR 68 | Temp 98.8°F | Ht 64.0 in | Wt 150.0 lb

## 2023-12-30 DIAGNOSIS — E559 Vitamin D deficiency, unspecified: Secondary | ICD-10-CM

## 2023-12-30 DIAGNOSIS — E1169 Type 2 diabetes mellitus with other specified complication: Secondary | ICD-10-CM | POA: Diagnosis not present

## 2023-12-30 DIAGNOSIS — E785 Hyperlipidemia, unspecified: Secondary | ICD-10-CM | POA: Diagnosis not present

## 2023-12-30 DIAGNOSIS — Z79899 Other long term (current) drug therapy: Secondary | ICD-10-CM

## 2023-12-30 DIAGNOSIS — F0394 Unspecified dementia, unspecified severity, with anxiety: Secondary | ICD-10-CM

## 2023-12-30 DIAGNOSIS — E0800 Diabetes mellitus due to underlying condition with hyperosmolarity without nonketotic hyperglycemic-hyperosmolar coma (NKHHC): Secondary | ICD-10-CM | POA: Diagnosis not present

## 2023-12-30 DIAGNOSIS — M549 Dorsalgia, unspecified: Secondary | ICD-10-CM

## 2023-12-30 DIAGNOSIS — E1159 Type 2 diabetes mellitus with other circulatory complications: Secondary | ICD-10-CM | POA: Diagnosis not present

## 2023-12-30 DIAGNOSIS — I152 Hypertension secondary to endocrine disorders: Secondary | ICD-10-CM

## 2023-12-30 DIAGNOSIS — M47817 Spondylosis without myelopathy or radiculopathy, lumbosacral region: Secondary | ICD-10-CM | POA: Diagnosis not present

## 2023-12-30 LAB — BAYER DCA HB A1C WAIVED: HB A1C (BAYER DCA - WAIVED): 5.5 % (ref 4.8–5.6)

## 2023-12-30 MED ORDER — ATENOLOL 50 MG PO TABS: ORAL_TABLET | ORAL | 0 refills | Status: DC

## 2023-12-30 MED ORDER — MEMANTINE HCL 5 MG PO TABS: 5.0000 mg | ORAL_TABLET | Freq: Two times a day (BID) | ORAL | 2 refills | Status: DC

## 2023-12-30 MED ORDER — MELOXICAM 15 MG PO TABS
15.0000 mg | ORAL_TABLET | Freq: Every day | ORAL | 2 refills | Status: AC
Start: 2023-12-30 — End: ?

## 2023-12-30 MED ORDER — TRAMADOL HCL 50 MG PO TABS: 50.0000 mg | ORAL_TABLET | Freq: Four times a day (QID) | ORAL | 2 refills | Status: DC | PRN

## 2023-12-30 MED ORDER — ROSUVASTATIN CALCIUM 5 MG PO TABS: 5.0000 mg | ORAL_TABLET | Freq: Every day | ORAL | 2 refills | Status: AC

## 2023-12-30 MED ORDER — AMLODIPINE BESYLATE 10 MG PO TABS: 10.0000 mg | ORAL_TABLET | Freq: Every day | ORAL | 1 refills | Status: DC

## 2023-12-30 MED ORDER — DONEPEZIL HCL 5 MG PO TABS: 5.0000 mg | ORAL_TABLET | Freq: Every day | ORAL | 2 refills | Status: DC

## 2023-12-30 NOTE — Patient Instructions (Signed)
 Hypertension, Adult High blood pressure (hypertension) is when the force of blood pumping through the arteries is too strong. The arteries are the blood vessels that carry blood from the heart throughout the body. Hypertension forces the heart to work harder to pump blood and may cause arteries to become narrow or stiff. Untreated or uncontrolled hypertension can lead to a heart attack, heart failure, a stroke, kidney disease, and other problems. A blood pressure reading consists of a higher number over a lower number. Ideally, your blood pressure should be below 120/80. The first ("top") number is called the systolic pressure. It is a measure of the pressure in your arteries as your heart beats. The second ("bottom") number is called the diastolic pressure. It is a measure of the pressure in your arteries as the heart relaxes. What are the causes? The exact cause of this condition is not known. There are some conditions that result in high blood pressure. What increases the risk? Certain factors may make you more likely to develop high blood pressure. Some of these risk factors are under your control, including: Smoking. Not getting enough exercise or physical activity. Being overweight. Having too much fat, sugar, calories, or salt (sodium) in your diet. Drinking too much alcohol. Other risk factors include: Having a personal history of heart disease, diabetes, high cholesterol, or kidney disease. Stress. Having a family history of high blood pressure and high cholesterol. Having obstructive sleep apnea. Age. The risk increases with age. What are the signs or symptoms? High blood pressure may not cause symptoms. Very high blood pressure (hypertensive crisis) may cause: Headache. Fast or irregular heartbeats (palpitations). Shortness of breath. Nosebleed. Nausea and vomiting. Vision changes. Severe chest pain, dizziness, and seizures. How is this diagnosed? This condition is diagnosed by  measuring your blood pressure while you are seated, with your arm resting on a flat surface, your legs uncrossed, and your feet flat on the floor. The cuff of the blood pressure monitor will be placed directly against the skin of your upper arm at the level of your heart. Blood pressure should be measured at least twice using the same arm. Certain conditions can cause a difference in blood pressure between your right and left arms. If you have a high blood pressure reading during one visit or you have normal blood pressure with other risk factors, you may be asked to: Return on a different day to have your blood pressure checked again. Monitor your blood pressure at home for 1 week or longer. If you are diagnosed with hypertension, you may have other blood or imaging tests to help your health care provider understand your overall risk for other conditions. How is this treated? This condition is treated by making healthy lifestyle changes, such as eating healthy foods, exercising more, and reducing your alcohol intake. You may be referred for counseling on a healthy diet and physical activity. Your health care provider may prescribe medicine if lifestyle changes are not enough to get your blood pressure under control and if: Your systolic blood pressure is above 130. Your diastolic blood pressure is above 80. Your personal target blood pressure may vary depending on your medical conditions, your age, and other factors. Follow these instructions at home: Eating and drinking  Eat a diet that is high in fiber and potassium, and low in sodium, added sugar, and fat. An example of this eating plan is called the DASH diet. DASH stands for Dietary Approaches to Stop Hypertension. To eat this way: Eat  plenty of fresh fruits and vegetables. Try to fill one half of your plate at each meal with fruits and vegetables. Eat whole grains, such as whole-wheat pasta, brown rice, or whole-grain bread. Fill about one  fourth of your plate with whole grains. Eat or drink low-fat dairy products, such as skim milk or low-fat yogurt. Avoid fatty cuts of meat, processed or cured meats, and poultry with skin. Fill about one fourth of your plate with lean proteins, such as fish, chicken without skin, beans, eggs, or tofu. Avoid pre-made and processed foods. These tend to be higher in sodium, added sugar, and fat. Reduce your daily sodium intake. Many people with hypertension should eat less than 1,500 mg of sodium a day. Do not drink alcohol if: Your health care provider tells you not to drink. You are pregnant, may be pregnant, or are planning to become pregnant. If you drink alcohol: Limit how much you have to: 0-1 drink a day for women. 0-2 drinks a day for men. Know how much alcohol is in your drink. In the [place] one drink equals one 12 oz bottle of beer (355 mL), one 5 oz glass of wine (148 mL), or one 1 oz glass of hard liquor (44 mL). Lifestyle  Work with your health care provider to maintain a healthy body weight or to lose weight. Ask what an ideal weight is for you. Get at least 30 minutes of exercise that causes your heart to beat faster (aerobic exercise) most days of the week. Activities may include walking, swimming, or biking. Include exercise to strengthen your muscles (resistance exercise), such as Pilates or lifting weights, as part of your weekly exercise routine. Try to do these types of exercises for 30 minutes at least 3 days a week. Do not use any products that contain nicotine or tobacco. These products include cigarettes, chewing tobacco, and vaping devices, such as e-cigarettes. If you need help quitting, ask your health care provider. Monitor your blood pressure at home as told by your health care provider. Keep all follow-up visits. This is important. Medicines Take over-the-counter and prescription medicines only as told by your health care provider. Follow directions carefully. Blood  pressure medicines must be taken as prescribed. Do not skip doses of blood pressure medicine. Doing this puts you at risk for problems and can make the medicine less effective. Ask your health care provider about side effects or reactions to medicines that you should watch for. Contact a health care provider if you: Think you are having a reaction to a medicine you are taking. Have headaches that keep coming back (recurring). Feel dizzy. Have swelling in your ankles. Have trouble with your vision. Get help right away if you: Develop a severe headache or confusion. Have unusual weakness or numbness. Feel faint. Have severe pain in your chest or abdomen. Vomit repeatedly. Have trouble breathing. These symptoms may be an emergency. Get help right away. Call 911. Do not wait to see if the symptoms will go away. Do not drive yourself to the hospital. Summary Hypertension is when the force of blood pumping through your arteries is too strong. If this condition is not controlled, it may put you at risk for serious complications. Your personal target blood pressure may vary depending on your medical conditions, your age, and other factors. For most people, a normal blood pressure is less than 120/80. Hypertension is treated with lifestyle changes, medicines, or a combination of both. Lifestyle changes include losing weight, eating a healthy,  low-sodium diet, exercising more, and limiting alcohol. This information is not intended to replace advice given to you by your health care provider. Make sure you discuss any questions you have with your health care provider. Document Revised: 03/21/2021 Document Reviewed: 03/21/2021 Elsevier Patient Education  2024 [employer].

## 2023-12-30 NOTE — Progress Notes (Signed)
 Subjective:    Patient ID: Holly Price, female    DOB: 1950/09/07, 73 y.o.   MRN: [id number]  Chief Complaint  Patient presents with   Medical Management of Chronic Issues    6 month follow up   PT presents to the office today for chronic follow up. She has a hx of DVT and was taken off her xarelto  in February 2020.    She reports her memory is stable, but admits to being forgetful at times. Family is with her and states her memory is worse.   Her BP is elevated today. Her last visit we increased her lisinopril  to 20 mg from 10 mg. Hypertension This is a chronic problem. The current episode started more than 1 year ago. The problem has been waxing and waning since onset. The problem is uncontrolled. Associated symptoms include blurred vision. Pertinent negatives include no malaise/fatigue, peripheral edema or shortness of breath. Risk factors for coronary artery disease include dyslipidemia, diabetes mellitus, obesity and sedentary lifestyle. Past treatments include ACE inhibitors and diuretics. The current treatment provides moderate improvement.  Hyperlipidemia This is a chronic problem. The current episode started more than 1 year ago. The problem is controlled. Recent lipid tests were reviewed and are normal. Exacerbating diseases include obesity. Pertinent negatives include no shortness of breath. Current antihyperlipidemic treatment includes statins. The current treatment provides moderate improvement of lipids. Risk factors for coronary artery disease include dyslipidemia, diabetes mellitus, a sedentary lifestyle, hypertension and post-menopausal.  Diabetes She presents for her follow-up diabetic visit. She has type 2 diabetes mellitus. Associated symptoms include blurred vision and foot paresthesias. Diabetic complications include peripheral neuropathy. Risk factors for coronary artery disease include dyslipidemia, diabetes mellitus, hypertension, sedentary lifestyle and  post-menopausal. She is following a generally unhealthy diet. Her overall blood glucose range is 110-130 mg/dl. (Checks it daily, but forgot her paper)  Back Pain This is a chronic problem. The current episode started more than 1 year ago. The problem occurs intermittently. The problem has been waxing and waning since onset. The pain is present in the lumbar spine. The quality of the pain is described as aching. The pain is at a severity of 4/10. The pain is moderate. The symptoms are aggravated by twisting and bending. Risk factors include obesity. She has tried analgesics for the symptoms. The treatment provided mild relief.  Arthritis Presents for follow-up visit. She complains of pain and stiffness. Affected locations include the left hip. Her pain is at a severity of 0/10.       Review of Systems  Constitutional:  Negative for malaise/fatigue.  Eyes:  Positive for blurred vision.  Respiratory:  Negative for shortness of breath.   Musculoskeletal:  Positive for back pain and stiffness.  All other systems reviewed and are negative.  Family History  Problem Relation Age of Onset   Cancer Mother    Heart attack Father    Hypertension Son    Heart attack Sister    Colon cancer Neg Hx    Social History   Socioeconomic History   Marital status: Married    Spouse name: Josefa    Number of children: 1   Years of education: 11th   Highest education level: 11th grade  Occupational History   Occupation: Retired     Comment: CNA  Tobacco Use   Smoking status: Former    Current packs/day: 0.00    Average packs/day: 0.5 packs/day for 10.0 years (5.0 ttl pk-yrs)    Types: Cigarettes  Start date: 06/22/2002    Quit date: 06/22/2012    Years since quitting: 11.5   Smokeless tobacco: Never  Vaping Use   Vaping status: Never Used  Substance and Sexual Activity   Alcohol use: Not Currently   Drug use: Not Currently   Sexual activity: Not Currently  Other Topics Concern   Not on  file  Social History Narrative   Lives with husband of over 40 years   Son lives on the same street.   Social Drivers of Corporate investment banker Strain: Low Risk  (05/14/2023)   Overall Financial Resource Strain (CARDIA)    Difficulty of Paying Living Expenses: Not hard at all  Food Insecurity: No Food Insecurity (05/14/2023)   Hunger Vital Sign    Worried About Running Out of Food in the Last Year: Never true    Ran Out of Food in the Last Year: Never true  Transportation Needs: No Transportation Needs (05/14/2023)   PRAPARE - Administrator, Civil Service (Medical): No    Lack of Transportation (Non-Medical): No  Physical Activity: Insufficiently Active (05/14/2023)   Exercise Vital Sign    Days of Exercise per Week: 3 days    Minutes of Exercise per Session: 30 min  Stress: No Stress Concern Present (05/14/2023)   [employer] of Occupational Health - Occupational Stress Questionnaire    Feeling of Stress : Not at all  Social Connections: Moderately Integrated (05/14/2023)   Social Connection and Isolation Panel    Frequency of Communication with Friends and Family: More than three times a week    Frequency of Social Gatherings with Friends and Family: Three times a week    Attends Religious Services: More than 4 times per year    Active Member of Clubs or Organizations: No    Attends Banker Meetings: Never    Marital Status: Married        Objective:   Physical Exam Vitals reviewed.  Constitutional:      General: She is not in acute distress.    Appearance: She is well-developed. She is obese.  HENT:     Head: Normocephalic and atraumatic.     Right Ear: Tympanic membrane normal.     Left Ear: Tympanic membrane normal.  Eyes:     Pupils: Pupils are equal, round, and reactive to light.  Neck:     Thyroid : No thyromegaly.  Cardiovascular:     Rate and Rhythm: Normal rate and regular rhythm.     Heart sounds: Normal heart sounds.  No murmur heard. Pulmonary:     Effort: Pulmonary effort is normal. No respiratory distress.     Breath sounds: Normal breath sounds. No wheezing.  Abdominal:     General: Bowel sounds are normal. There is no distension.     Palpations: Abdomen is soft.     Tenderness: There is no abdominal tenderness.  Musculoskeletal:        General: No tenderness. Normal range of motion.     Cervical back: Normal range of motion and neck supple.     Right lower leg: Edema (trace) present.     Left lower leg: Edema (trace) present.  Skin:    General: Skin is warm and dry.  Neurological:     Mental Status: She is alert and oriented to person, place, and time.     Cranial Nerves: No cranial nerve deficit.     Deep Tendon Reflexes: Reflexes are normal and symmetric.  Psychiatric:        Behavior: Behavior normal.        Thought Content: Thought content normal.        Judgment: Judgment normal.       BP (!) 166/71   Pulse 68   Temp 98.8 F (37.1 C)   Ht 5' 4 (1.626 m)   Wt 150 lb (68 kg)   SpO2 96%   BMI 25.75 kg/m      Assessment & Plan:  DANAI GOTTO comes in today with chief complaint of Medical Management of Chronic Issues (6 month follow up)   Diagnosis and orders addressed:  1. Controlled substance agreement signed - traMADol  (ULTRAM ) 50 MG tablet; Take 1 tablet (50 mg total) by mouth every 6 (six) hours as needed.  Dispense: 30 tablet; Refill: 2 - CMP14+EGFR  2. Mid back pain - traMADol  (ULTRAM ) 50 MG tablet; Take 1 tablet (50 mg total) by mouth every 6 (six) hours as needed.  Dispense: 30 tablet; Refill: 2 - meloxicam  (MOBIC ) 15 MG tablet; Take 1 tablet (15 mg total) by mouth daily.  Dispense: 90 tablet; Refill: 2 - CMP14+EGFR  3. Dementia with anxiety, unspecified dementia severity, unspecified dementia type (HCC) - memantine  (NAMENDA ) 5 MG tablet; Take 1 tablet (5 mg total) by mouth 2 (two) times daily.  Dispense: 180 tablet; Refill: 2 - donepezil  (ARICEPT ) 5 MG  tablet; Take 1 tablet (5 mg total) by mouth at bedtime.  Dispense: 90 tablet; Refill: 2 - CMP14+EGFR  4. Hypertension associated with diabetes (HCC) - atenolol  (TENORMIN ) 50 MG tablet; TAKE ONE (1) TABLET BY MOUTH EVERY DAY  Dispense: 90 tablet; Refill: 0 - amLODipine  (NORVASC ) 10 MG tablet; Take 1 tablet (10 mg total) by mouth daily.  Dispense: 90 tablet; Refill: 1 - CMP14+EGFR  5. Diabetes mellitus due to underlying condition with hyperosmolarity without coma, without long-term current use of insulin (HCC) (Primary) - Bayer DCA Hb A1c Waived - CMP14+EGFR  6. Vitamin D  deficiency  - CMP14+EGFR  7. Lumbosacral spondylosis without myelopathy - CMP14+EGFR  8. Hyperlipidemia associated with type 2 diabetes mellitus (HCC) - CMP14+EGFR   Labs pending Will increase amlodipine  to 10 mg from 5 mg  Add tylenol  with mobic   Continue current medications  Patient reviewed in [place] controlled database, no flags noted. Contract and drug screen are up to date.  Health Maintenance reviewed Diet and exercise encouraged  Follow up plan: 2 weeks to recheck HTN   Bari Learn, FNP

## 2023-12-31 ENCOUNTER — Ambulatory Visit: Payer: Self-pay | Admitting: Family

## 2023-12-31 ENCOUNTER — Telehealth: Payer: Self-pay

## 2023-12-31 LAB — CMP14+EGFR
ALT: 18 IU/L (ref 0–32)
AST: 18 IU/L (ref 0–40)
Albumin: 4.2 g/dL (ref 3.9–4.9)
Alkaline Phosphatase: 49 IU/L (ref 44–121)
BUN/Creatinine Ratio: 25 (ref 12–28)
BUN: 29 mg/dL — ABNORMAL HIGH (ref 8–27)
Bilirubin Total: 0.4 mg/dL (ref 0.0–1.2)
CO2: 24 mmol/L (ref 20–29)
Calcium: 9.6 mg/dL (ref 8.7–10.3)
Chloride: 103 mmol/L (ref 96–106)
Creatinine, Ser: 1.18 mg/dL — ABNORMAL HIGH (ref 0.57–1.00)
Globulin, Total: 2.4 g/dL (ref 1.5–4.5)
Glucose: 93 mg/dL (ref 70–99)
Potassium: 4.7 mmol/L (ref 3.5–5.2)
Sodium: 143 mmol/L (ref 134–144)
Total Protein: 6.6 g/dL (ref 6.0–8.5)
eGFR: 50 mL/min/1.73 — ABNORMAL LOW (ref >59)

## 2023-12-31 NOTE — Telephone Encounter (Signed)
 Copied from CRM #[id number]. Topic: Clinical - Prescription Issue >> Dec 31, 2023  9:18 AM Holly Price wrote: Reason for CRM: PT daughter in law Rosaline called in about medication for the pt, and has some questions about the medications and which should stay active    [facility] callback - [phone]

## 2024-01-03 ENCOUNTER — Other Ambulatory Visit: Payer: Self-pay | Admitting: Family

## 2024-01-03 MED ORDER — AMLODIPINE BESYLATE 5 MG PO TABS: 5.0000 mg | ORAL_TABLET | Freq: Every day | ORAL | 1 refills | Status: DC

## 2024-01-13 ENCOUNTER — Ambulatory Visit: Admitting: Family

## 2024-01-13 ENCOUNTER — Encounter: Payer: Self-pay | Admitting: Family

## 2024-01-13 VITALS — BP 125/56 | HR 54 | Temp 97.8°F | Ht 64.0 in | Wt 151.0 lb

## 2024-01-13 DIAGNOSIS — F02B0 Dementia in other diseases classified elsewhere, moderate, without behavioral disturbance, psychotic disturbance, mood disturbance, and anxiety: Secondary | ICD-10-CM

## 2024-01-13 DIAGNOSIS — G300 Alzheimer's disease with early onset: Secondary | ICD-10-CM | POA: Diagnosis not present

## 2024-01-13 DIAGNOSIS — I152 Hypertension secondary to endocrine disorders: Secondary | ICD-10-CM | POA: Diagnosis not present

## 2024-01-13 DIAGNOSIS — E1159 Type 2 diabetes mellitus with other circulatory complications: Secondary | ICD-10-CM | POA: Diagnosis not present

## 2024-01-13 NOTE — Patient Instructions (Signed)
 Hypertension, Adult High blood pressure (hypertension) is when the force of blood pumping through the arteries is too strong. The arteries are the blood vessels that carry blood from the heart throughout the body. Hypertension forces the heart to work harder to pump blood and may cause arteries to become narrow or stiff. Untreated or uncontrolled hypertension can lead to a heart attack, heart failure, a stroke, kidney disease, and other problems. A blood pressure reading consists of a higher number over a lower number. Ideally, your blood pressure should be below 120/80. The first ("top") number is called the systolic pressure. It is a measure of the pressure in your arteries as your heart beats. The second ("bottom") number is called the diastolic pressure. It is a measure of the pressure in your arteries as the heart relaxes. What are the causes? The exact cause of this condition is not known. There are some conditions that result in high blood pressure. What increases the risk? Certain factors may make you more likely to develop high blood pressure. Some of these risk factors are under your control, including: Smoking. Not getting enough exercise or physical activity. Being overweight. Having too much fat, sugar, calories, or salt (sodium) in your diet. Drinking too much alcohol. Other risk factors include: Having a personal history of heart disease, diabetes, high cholesterol, or kidney disease. Stress. Having a family history of high blood pressure and high cholesterol. Having obstructive sleep apnea. Age. The risk increases with age. What are the signs or symptoms? High blood pressure may not cause symptoms. Very high blood pressure (hypertensive crisis) may cause: Headache. Fast or irregular heartbeats (palpitations). Shortness of breath. Nosebleed. Nausea and vomiting. Vision changes. Severe chest pain, dizziness, and seizures. How is this diagnosed? This condition is diagnosed by  measuring your blood pressure while you are seated, with your arm resting on a flat surface, your legs uncrossed, and your feet flat on the floor. The cuff of the blood pressure monitor will be placed directly against the skin of your upper arm at the level of your heart. Blood pressure should be measured at least twice using the same arm. Certain conditions can cause a difference in blood pressure between your right and left arms. If you have a high blood pressure reading during one visit or you have normal blood pressure with other risk factors, you may be asked to: Return on a different day to have your blood pressure checked again. Monitor your blood pressure at home for 1 week or longer. If you are diagnosed with hypertension, you may have other blood or imaging tests to help your health care provider understand your overall risk for other conditions. How is this treated? This condition is treated by making healthy lifestyle changes, such as eating healthy foods, exercising more, and reducing your alcohol intake. You may be referred for counseling on a healthy diet and physical activity. Your health care provider may prescribe medicine if lifestyle changes are not enough to get your blood pressure under control and if: Your systolic blood pressure is above 130. Your diastolic blood pressure is above 80. Your personal target blood pressure may vary depending on your medical conditions, your age, and other factors. Follow these instructions at home: Eating and drinking  Eat a diet that is high in fiber and potassium, and low in sodium, added sugar, and fat. An example of this eating plan is called the DASH diet. DASH stands for Dietary Approaches to Stop Hypertension. To eat this way: Eat  plenty of fresh fruits and vegetables. Try to fill one half of your plate at each meal with fruits and vegetables. Eat whole grains, such as whole-wheat pasta, brown rice, or whole-grain bread. Fill about one  fourth of your plate with whole grains. Eat or drink low-fat dairy products, such as skim milk or low-fat yogurt. Avoid fatty cuts of meat, processed or cured meats, and poultry with skin. Fill about one fourth of your plate with lean proteins, such as fish, chicken without skin, beans, eggs, or tofu. Avoid pre-made and processed foods. These tend to be higher in sodium, added sugar, and fat. Reduce your daily sodium intake. Many people with hypertension should eat less than 1,500 mg of sodium a day. Do not drink alcohol if: Your health care provider tells you not to drink. You are pregnant, may be pregnant, or are planning to become pregnant. If you drink alcohol: Limit how much you have to: 0-1 drink a day for women. 0-2 drinks a day for men. Know how much alcohol is in your drink. In the [place] one drink equals one 12 oz bottle of beer (355 mL), one 5 oz glass of wine (148 mL), or one 1 oz glass of hard liquor (44 mL). Lifestyle  Work with your health care provider to maintain a healthy body weight or to lose weight. Ask what an ideal weight is for you. Get at least 30 minutes of exercise that causes your heart to beat faster (aerobic exercise) most days of the week. Activities may include walking, swimming, or biking. Include exercise to strengthen your muscles (resistance exercise), such as Pilates or lifting weights, as part of your weekly exercise routine. Try to do these types of exercises for 30 minutes at least 3 days a week. Do not use any products that contain nicotine or tobacco. These products include cigarettes, chewing tobacco, and vaping devices, such as e-cigarettes. If you need help quitting, ask your health care provider. Monitor your blood pressure at home as told by your health care provider. Keep all follow-up visits. This is important. Medicines Take over-the-counter and prescription medicines only as told by your health care provider. Follow directions carefully. Blood  pressure medicines must be taken as prescribed. Do not skip doses of blood pressure medicine. Doing this puts you at risk for problems and can make the medicine less effective. Ask your health care provider about side effects or reactions to medicines that you should watch for. Contact a health care provider if you: Think you are having a reaction to a medicine you are taking. Have headaches that keep coming back (recurring). Feel dizzy. Have swelling in your ankles. Have trouble with your vision. Get help right away if you: Develop a severe headache or confusion. Have unusual weakness or numbness. Feel faint. Have severe pain in your chest or abdomen. Vomit repeatedly. Have trouble breathing. These symptoms may be an emergency. Get help right away. Call 911. Do not wait to see if the symptoms will go away. Do not drive yourself to the hospital. Summary Hypertension is when the force of blood pumping through your arteries is too strong. If this condition is not controlled, it may put you at risk for serious complications. Your personal target blood pressure may vary depending on your medical conditions, your age, and other factors. For most people, a normal blood pressure is less than 120/80. Hypertension is treated with lifestyle changes, medicines, or a combination of both. Lifestyle changes include losing weight, eating a healthy,  low-sodium diet, exercising more, and limiting alcohol. This information is not intended to replace advice given to you by your health care provider. Make sure you discuss any questions you have with your health care provider. Document Revised: 03/21/2021 Document Reviewed: 03/21/2021 Elsevier Patient Education  2024 [employer].

## 2024-01-13 NOTE — Progress Notes (Signed)
 Subjective:    Patient ID: Holly Price, female    DOB: [birth year], 73 y.o.   MRN: [id number]  Chief Complaint  Patient presents with   Hypertension   PT presents to the office today to recheck HTN. She was seen on 12/30/23 with elevated BP and we increased her Norvasc  to 10 mg from 5 mg. After they got home, her daughter realized her mom had not been taking her Atenolol  50 mg daily in several months. They restarted this and continued her Norvasc  at 5 mg.   She does have dementia and family helps with her medications.   Her BP is not at goal today.  Hypertension This is a chronic problem. The current episode started more than 1 year ago. The problem is unchanged. The problem is uncontrolled. Pertinent negatives include no malaise/fatigue, peripheral edema or shortness of breath. Risk factors for coronary artery disease include sedentary lifestyle. Past treatments include beta blockers, calcium  channel blockers, diuretics and ACE inhibitors. The current treatment provides mild improvement.      Review of Systems  Constitutional:  Negative for malaise/fatigue.  Respiratory:  Negative for shortness of breath.   All other systems reviewed and are negative.   Social History   Socioeconomic History   Marital status: Married    Spouse name: Holly Price    Number of children: 1   Years of education: 11th   Highest education level: 11th grade  Occupational History   Occupation: Retired     Comment: CNA  Tobacco Use   Smoking status: Former    Current packs/day: 0.00    Average packs/day: 0.5 packs/day for 10.0 years (5.0 ttl pk-yrs)    Types: Cigarettes    Start date: 06/22/2002    Quit date: 06/22/2012    Years since quitting: 11.5   Smokeless tobacco: Never  Vaping Use   Vaping status: Never Used  Substance and Sexual Activity   Alcohol use: Not Currently   Drug use: Not Currently   Sexual activity: Not Currently  Other Topics Concern   Not on file  Social History  Narrative   Lives with husband of over 40 years   Son lives on the same street.   Social Drivers of Corporate investment banker Strain: Low Risk  (01/13/2024)   Overall Financial Resource Strain (CARDIA)    Difficulty of Paying Living Expenses: Not very hard  Food Insecurity: No Food Insecurity (01/13/2024)   Hunger Vital Sign    Worried About Running Out of Food in the Last Year: Never true    Ran Out of Food in the Last Year: Never true  Transportation Needs: No Transportation Needs (01/13/2024)   PRAPARE - Administrator, Civil Service (Medical): No    Lack of Transportation (Non-Medical): No  Physical Activity: Inactive (01/13/2024)   Exercise Vital Sign    Days of Exercise per Week: 0 days    Minutes of Exercise per Session: Not on file  Stress: No Stress Concern Present (01/13/2024)   [employer] of Occupational Health - Occupational Stress Questionnaire    Feeling of Stress: Not at all  Social Connections: Moderately Isolated (01/13/2024)   Social Connection and Isolation Panel    Frequency of Communication with Friends and Family: Once a week    Frequency of Social Gatherings with Friends and Family: Once a week    Attends Religious Services: More than 4 times per year    Active Member of Clubs or Organizations: No  Attends Banker Meetings: Not on file    Marital Status: Married   Family History  Problem Relation Age of Onset   Cancer Mother    Heart attack Father    Hypertension Son    Heart attack Sister    Colon cancer Neg Hx         Objective:   Physical Exam Vitals reviewed.  Constitutional:      General: She is not in acute distress.    Appearance: She is well-developed.  HENT:     Head: Normocephalic and atraumatic.  Eyes:     Pupils: Pupils are equal, round, and reactive to light.  Neck:     Thyroid : No thyromegaly.  Cardiovascular:     Rate and Rhythm: Normal rate and regular rhythm.     Heart sounds: Normal heart  sounds. No murmur heard. Pulmonary:     Effort: Pulmonary effort is normal. No respiratory distress.     Breath sounds: Normal breath sounds. No wheezing.  Abdominal:     General: Bowel sounds are normal. There is no distension.     Palpations: Abdomen is soft.     Tenderness: There is no abdominal tenderness.  Musculoskeletal:        General: No tenderness. Normal range of motion.     Cervical back: Normal range of motion and neck supple.  Skin:    General: Skin is warm and dry.  Neurological:     Mental Status: She is alert and oriented to person, place, and time.     Cranial Nerves: No cranial nerve deficit.     Motor: Weakness present.     Deep Tendon Reflexes: Reflexes are normal and symmetric.  Psychiatric:        Behavior: Behavior normal.        Thought Content: Thought content normal.        Judgment: Judgment normal.       BP (!) 125/56   Pulse (!) 54   Temp 97.8 F (36.6 C)   Ht 5' 4 (1.626 m)   Wt 151 lb (68.5 kg)   SpO2 96%   BMI 25.92 kg/m      Assessment & Plan:  Holly Price comes in today with chief complaint of Hypertension   Diagnosis and orders addressed:  1. 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 Hypertension associated with diabetes (HCC) (Primary) At goal today! Continue current medications  -Dash diet information given -Exercise encouraged - Stress Management  -Continue current meds     Return in about 4 months (around 05/14/2024), or if symptoms worsen or fail to improve.    Bari Learn, FNP

## 2024-02-05 ENCOUNTER — Telehealth: Payer: Self-pay | Admitting: Family

## 2024-02-05 NOTE — Telephone Encounter (Signed)
 Called and spoke with daughter. Patient is about to run out of Victoza  looked on chart looks like that has been discontinued for a while patient A1c was 5.5 last time. Last time it was ordered was in 2023, but daughter states she has been taking it every day. Is she suppose to be on it if so needs an order , but it does not look like she should be on it. Daughter wants a call back once Amberg returns.

## 2024-02-05 NOTE — Telephone Encounter (Unsigned)
 Copied from CRM [id number]. Topic: Clinical - Medication Question >> Feb 05, 2024  8:08 AM Cherylann RAMAN wrote: Reason for CRM: Patients daughter Rosaline Rung called in requesting to refill the patient's insulin Victoza  as the patient is running low. She states that the insulin is shipped to the office and the patient has to pick it up from there as it is through the patient assistance program. Please contact Rosaline Rung at [phone].

## 2024-02-06 ENCOUNTER — Telehealth: Payer: Self-pay

## 2024-02-06 NOTE — Telephone Encounter (Signed)
 Pt can stop this medication. Please have her bring ALL her medications to her next visit so we can make sure we have a correct list. Low carb diet.   Follow up 2 months

## 2024-02-06 NOTE — Telephone Encounter (Signed)
Called patient daughter she is aware

## 2024-03-11 ENCOUNTER — Ambulatory Visit (INDEPENDENT_AMBULATORY_CARE_PROVIDER_SITE_OTHER): Admitting: *Deleted

## 2024-03-11 DIAGNOSIS — Z23 Encounter for immunization: Secondary | ICD-10-CM | POA: Diagnosis not present

## 2024-03-14 ENCOUNTER — Other Ambulatory Visit: Payer: Self-pay | Admitting: Family

## 2024-03-14 DIAGNOSIS — I152 Hypertension secondary to endocrine disorders: Secondary | ICD-10-CM

## 2024-03-18 ENCOUNTER — Ambulatory Visit

## 2024-03-28 ENCOUNTER — Other Ambulatory Visit: Payer: Self-pay | Admitting: Family

## 2024-03-28 DIAGNOSIS — I152 Hypertension secondary to endocrine disorders: Secondary | ICD-10-CM

## 2024-03-31 ENCOUNTER — Ambulatory Visit: Admitting: Family

## 2024-04-27 ENCOUNTER — Encounter: Payer: Self-pay | Admitting: Family

## 2024-04-27 ENCOUNTER — Ambulatory Visit: Payer: Self-pay | Admitting: Family

## 2024-04-27 VITALS — BP 168/57 | HR 49 | Temp 98.7°F | Ht 64.0 in | Wt 152.4 lb

## 2024-04-27 DIAGNOSIS — I152 Hypertension secondary to endocrine disorders: Secondary | ICD-10-CM

## 2024-04-27 DIAGNOSIS — Z0000 Encounter for general adult medical examination without abnormal findings: Secondary | ICD-10-CM

## 2024-04-27 DIAGNOSIS — J432 Centrilobular emphysema: Secondary | ICD-10-CM | POA: Diagnosis not present

## 2024-04-27 DIAGNOSIS — M549 Dorsalgia, unspecified: Secondary | ICD-10-CM | POA: Diagnosis not present

## 2024-04-27 DIAGNOSIS — E663 Overweight: Secondary | ICD-10-CM

## 2024-04-27 DIAGNOSIS — F0394 Unspecified dementia, unspecified severity, with anxiety: Secondary | ICD-10-CM | POA: Diagnosis not present

## 2024-04-27 DIAGNOSIS — E1142 Type 2 diabetes mellitus with diabetic polyneuropathy: Secondary | ICD-10-CM

## 2024-04-27 DIAGNOSIS — E785 Hyperlipidemia, unspecified: Secondary | ICD-10-CM

## 2024-04-27 DIAGNOSIS — Z0001 Encounter for general adult medical examination with abnormal findings: Secondary | ICD-10-CM

## 2024-04-27 DIAGNOSIS — M47817 Spondylosis without myelopathy or radiculopathy, lumbosacral region: Secondary | ICD-10-CM

## 2024-04-27 DIAGNOSIS — E1159 Type 2 diabetes mellitus with other circulatory complications: Secondary | ICD-10-CM

## 2024-04-27 DIAGNOSIS — M8580 Other specified disorders of bone density and structure, unspecified site: Secondary | ICD-10-CM

## 2024-04-27 DIAGNOSIS — E559 Vitamin D deficiency, unspecified: Secondary | ICD-10-CM

## 2024-04-27 DIAGNOSIS — E1169 Type 2 diabetes mellitus with other specified complication: Secondary | ICD-10-CM

## 2024-04-27 DIAGNOSIS — Z79899 Other long term (current) drug therapy: Secondary | ICD-10-CM

## 2024-04-27 DIAGNOSIS — E0800 Diabetes mellitus due to underlying condition with hyperosmolarity without nonketotic hyperglycemic-hyperosmolar coma (NKHHC): Secondary | ICD-10-CM

## 2024-04-27 DIAGNOSIS — E8881 Metabolic syndrome: Secondary | ICD-10-CM

## 2024-04-27 LAB — BAYER DCA HB A1C WAIVED: HB A1C (BAYER DCA - WAIVED): 5.5 % (ref 4.8–5.6)

## 2024-04-27 MED ORDER — TRAMADOL HCL 50 MG PO TABS: 50.0000 mg | ORAL_TABLET | Freq: Four times a day (QID) | ORAL | 2 refills | Status: AC | PRN

## 2024-04-27 MED ORDER — DONEPEZIL HCL 10 MG PO TABS: 10.0000 mg | ORAL_TABLET | Freq: Every day | ORAL | 2 refills | Status: AC

## 2024-04-27 MED ORDER — MEMANTINE HCL 10 MG PO TABS: 10.0000 mg | ORAL_TABLET | Freq: Two times a day (BID) | ORAL | 2 refills | Status: AC

## 2024-04-27 MED ORDER — AMLODIPINE BESYLATE 10 MG PO TABS: 10.0000 mg | ORAL_TABLET | Freq: Every day | ORAL | 2 refills | Status: AC

## 2024-04-27 NOTE — Patient Instructions (Signed)
 Hypertension, Adult High blood pressure (hypertension) is when the force of blood pumping through the arteries is too strong. The arteries are the blood vessels that carry blood from the heart throughout the body. Hypertension forces the heart to work harder to pump blood and may cause arteries to become narrow or stiff. Untreated or uncontrolled hypertension can lead to a heart attack, heart failure, a stroke, kidney disease, and other problems. A blood pressure reading consists of a higher number over a lower number. Ideally, your blood pressure should be below 120/80. The first ("top") number is called the systolic pressure. It is a measure of the pressure in your arteries as your heart beats. The second ("bottom") number is called the diastolic pressure. It is a measure of the pressure in your arteries as the heart relaxes. What are the causes? The exact cause of this condition is not known. There are some conditions that result in high blood pressure. What increases the risk? Certain factors may make you more likely to develop high blood pressure. Some of these risk factors are under your control, including: Smoking. Not getting enough exercise or physical activity. Being overweight. Having too much fat, sugar, calories, or salt (sodium) in your diet. Drinking too much alcohol. Other risk factors include: Having a personal history of heart disease, diabetes, high cholesterol, or kidney disease. Stress. Having a family history of high blood pressure and high cholesterol. Having obstructive sleep apnea. Age. The risk increases with age. What are the signs or symptoms? High blood pressure may not cause symptoms. Very high blood pressure (hypertensive crisis) may cause: Headache. Fast or irregular heartbeats (palpitations). Shortness of breath. Nosebleed. Nausea and vomiting. Vision changes. Severe chest pain, dizziness, and seizures. How is this diagnosed? This condition is diagnosed by  measuring your blood pressure while you are seated, with your arm resting on a flat surface, your legs uncrossed, and your feet flat on the floor. The cuff of the blood pressure monitor will be placed directly against the skin of your upper arm at the level of your heart. Blood pressure should be measured at least twice using the same arm. Certain conditions can cause a difference in blood pressure between your right and left arms. If you have a high blood pressure reading during one visit or you have normal blood pressure with other risk factors, you may be asked to: Return on a different day to have your blood pressure checked again. Monitor your blood pressure at home for 1 week or longer. If you are diagnosed with hypertension, you may have other blood or imaging tests to help your health care provider understand your overall risk for other conditions. How is this treated? This condition is treated by making healthy lifestyle changes, such as eating healthy foods, exercising more, and reducing your alcohol intake. You may be referred for counseling on a healthy diet and physical activity. Your health care provider may prescribe medicine if lifestyle changes are not enough to get your blood pressure under control and if: Your systolic blood pressure is above 130. Your diastolic blood pressure is above 80. Your personal target blood pressure may vary depending on your medical conditions, your age, and other factors. Follow these instructions at home: Eating and drinking  Eat a diet that is high in fiber and potassium, and low in sodium, added sugar, and fat. An example of this eating plan is called the DASH diet. DASH stands for Dietary Approaches to Stop Hypertension. To eat this way: Eat  plenty of fresh fruits and vegetables. Try to fill one half of your plate at each meal with fruits and vegetables. Eat whole grains, such as whole-wheat pasta, brown rice, or whole-grain bread. Fill about one  fourth of your plate with whole grains. Eat or drink low-fat dairy products, such as skim milk or low-fat yogurt. Avoid fatty cuts of meat, processed or cured meats, and poultry with skin. Fill about one fourth of your plate with lean proteins, such as fish, chicken without skin, beans, eggs, or tofu. Avoid pre-made and processed foods. These tend to be higher in sodium, added sugar, and fat. Reduce your daily sodium intake. Many people with hypertension should eat less than 1,500 mg of sodium a day. Do not drink alcohol if: Your health care provider tells you not to drink. You are pregnant, may be pregnant, or are planning to become pregnant. If you drink alcohol: Limit how much you have to: 0-1 drink a day for women. 0-2 drinks a day for men. Know how much alcohol is in your drink. In the [place] one drink equals one 12 oz bottle of beer (355 mL), one 5 oz glass of wine (148 mL), or one 1 oz glass of hard liquor (44 mL). Lifestyle  Work with your health care provider to maintain a healthy body weight or to lose weight. Ask what an ideal weight is for you. Get at least 30 minutes of exercise that causes your heart to beat faster (aerobic exercise) most days of the week. Activities may include walking, swimming, or biking. Include exercise to strengthen your muscles (resistance exercise), such as Pilates or lifting weights, as part of your weekly exercise routine. Try to do these types of exercises for 30 minutes at least 3 days a week. Do not use any products that contain nicotine or tobacco. These products include cigarettes, chewing tobacco, and vaping devices, such as e-cigarettes. If you need help quitting, ask your health care provider. Monitor your blood pressure at home as told by your health care provider. Keep all follow-up visits. This is important. Medicines Take over-the-counter and prescription medicines only as told by your health care provider. Follow directions carefully. Blood  pressure medicines must be taken as prescribed. Do not skip doses of blood pressure medicine. Doing this puts you at risk for problems and can make the medicine less effective. Ask your health care provider about side effects or reactions to medicines that you should watch for. Contact a health care provider if you: Think you are having a reaction to a medicine you are taking. Have headaches that keep coming back (recurring). Feel dizzy. Have swelling in your ankles. Have trouble with your vision. Get help right away if you: Develop a severe headache or confusion. Have unusual weakness or numbness. Feel faint. Have severe pain in your chest or abdomen. Vomit repeatedly. Have trouble breathing. These symptoms may be an emergency. Get help right away. Call 911. Do not wait to see if the symptoms will go away. Do not drive yourself to the hospital. Summary Hypertension is when the force of blood pumping through your arteries is too strong. If this condition is not controlled, it may put you at risk for serious complications. Your personal target blood pressure may vary depending on your medical conditions, your age, and other factors. For most people, a normal blood pressure is less than 120/80. Hypertension is treated with lifestyle changes, medicines, or a combination of both. Lifestyle changes include losing weight, eating a healthy,  low-sodium diet, exercising more, and limiting alcohol. This information is not intended to replace advice given to you by your health care provider. Make sure you discuss any questions you have with your health care provider. Document Revised: 03/21/2021 Document Reviewed: 03/21/2021 Elsevier Patient Education  2024 [employer].

## 2024-04-27 NOTE — Progress Notes (Signed)
 Subjective:    Patient ID: Holly Price, female    DOB: [birth year], 73 y.o.   MRN: [id number]  Chief Complaint  Patient presents with   Medical Management of Chronic Issues    No concerns.  fasting. Had bottom teeth pulled before thanksgiving,   PT presents to the office today for CPE.   She has a hx of DVT and was taken off her xarelto  in February 2020.    She reports her memory is stable, but admits to being forgetful at times. Family is with her and states her memory is worse.   Her BP is elevated today. Reports she has taken all her medications today.   Has COPD, but quit smoking approx 2005. States her breathing is stable.  Hypertension This is a chronic problem. The current episode started more than 1 year ago. The problem has been waxing and waning since onset. The problem is uncontrolled. Pertinent negatives include no blurred vision, malaise/fatigue, peripheral edema or shortness of breath. Risk factors for coronary artery disease include dyslipidemia, diabetes mellitus, sedentary lifestyle and post-menopausal state. Past treatments include ACE inhibitors and diuretics. The current treatment provides moderate improvement.  Hyperlipidemia This is a chronic problem. The current episode started more than 1 year ago. The problem is controlled. Recent lipid tests were reviewed and are normal. Exacerbating diseases include obesity. Pertinent negatives include no shortness of breath. Current antihyperlipidemic treatment includes statins. The current treatment provides moderate improvement of lipids. Risk factors for coronary artery disease include dyslipidemia, diabetes mellitus, a sedentary lifestyle, hypertension and post-menopausal.  Diabetes She presents for her follow-up diabetic visit. She has type 2 diabetes mellitus. Associated symptoms include foot paresthesias. Pertinent negatives for diabetes include no blurred vision. Diabetic complications include peripheral neuropathy.  Risk factors for coronary artery disease include dyslipidemia, diabetes mellitus, hypertension, sedentary lifestyle and post-menopausal. She is following a generally unhealthy diet. (Checks it daily, but forgot her paper)  Back Pain This is a chronic problem. The current episode started more than 1 year ago. The problem occurs intermittently. The problem has been waxing and waning since onset. The pain is present in the lumbar spine. The quality of the pain is described as aching. The pain is at a severity of 2/10. The pain is moderate. The symptoms are aggravated by twisting and bending. Risk factors include obesity. She has tried analgesics for the symptoms. The treatment provided mild relief.  Arthritis Presents for follow-up visit. She complains of pain and stiffness. Affected locations include the left hip. Her pain is at a severity of 0/10.       Review of Systems  Constitutional:  Negative for malaise/fatigue.  Eyes:  Negative for blurred vision.  Respiratory:  Negative for shortness of breath.   Musculoskeletal:  Positive for back pain and stiffness.  All other systems reviewed and are negative.  Family History  Problem Relation Age of Onset   Cancer Mother    Heart attack Father    Hypertension Son    Heart attack Sister    Colon cancer Neg Hx    Social History   Socioeconomic History   Marital status: Married    Spouse name: Josefa    Number of children: 1   Years of education: 11th   Highest education level: 11th grade  Occupational History   Occupation: Retired     Comment: CNA  Tobacco Use   Smoking status: Former    Current packs/day: 0.00    Average packs/day: 0.5 packs/day  for 10.0 years (5.0 ttl pk-yrs)    Types: Cigarettes    Start date: 06/22/2002    Quit date: 06/22/2012    Years since quitting: 11.8   Smokeless tobacco: Never  Vaping Use   Vaping status: Never Used  Substance and Sexual Activity   Alcohol use: Not Currently   Drug use: Not Currently    Sexual activity: Not Currently  Other Topics Concern   Not on file  Social History Narrative   Lives with husband of over 40 years   Son lives on the same street.   Social Drivers of Corporate Investment Banker Strain: Low Risk  (04/27/2024)   Overall Financial Resource Strain (CARDIA)    Difficulty of Paying Living Expenses: Not very hard  Food Insecurity: No Food Insecurity (04/27/2024)   Hunger Vital Sign    Worried About Running Out of Food in the Last Year: Never true    Ran Out of Food in the Last Year: Never true  Transportation Needs: No Transportation Needs (04/27/2024)   PRAPARE - Administrator, Civil Service (Medical): No    Lack of Transportation (Non-Medical): No  Physical Activity: Insufficiently Active (04/27/2024)   Exercise Vital Sign    Days of Exercise per Week: 1 day    Minutes of Exercise per Session: 10 min  Stress: No Stress Concern Present (04/27/2024)   [employer] of Occupational Health - Occupational Stress Questionnaire    Feeling of Stress: Not at all  Social Connections: Moderately Isolated (04/27/2024)   Social Connection and Isolation Panel    Frequency of Communication with Friends and Family: Once a week    Frequency of Social Gatherings with Friends and Family: Once a week    Attends Religious Services: More than 4 times per year    Active Member of [employer] or Organizations: No    Attends Engineer, Structural: Not on file    Marital Status: Married        Objective:   Physical Exam Vitals reviewed.  Constitutional:      General: She is not in acute distress.    Appearance: She is well-developed. She is obese.  HENT:     Head: Normocephalic and atraumatic.     Right Ear: Tympanic membrane normal.     Left Ear: Tympanic membrane normal.  Eyes:     Pupils: Pupils are equal, round, and reactive to light.  Neck:     Thyroid : No thyromegaly.  Cardiovascular:     Rate and Rhythm: Normal rate and regular rhythm.      Heart sounds: Normal heart sounds. No murmur heard. Pulmonary:     Effort: Pulmonary effort is normal. No respiratory distress.     Breath sounds: Normal breath sounds. No wheezing.  Abdominal:     General: Bowel sounds are normal. There is no distension.     Palpations: Abdomen is soft.     Tenderness: There is no abdominal tenderness.  Musculoskeletal:        General: No tenderness. Normal range of motion.     Cervical back: Normal range of motion and neck supple.     Right lower leg: No edema.     Left lower leg: No edema.  Skin:    General: Skin is warm and dry.  Neurological:     Mental Status: She is alert and oriented to person, place, and time.     Cranial Nerves: No cranial nerve deficit.  Deep Tendon Reflexes: Reflexes are normal and symmetric.  Psychiatric:        Behavior: Behavior normal.        Thought Content: Thought content normal.        Judgment: Judgment normal.       BP (!) 168/57   Pulse (!) 49   Temp 98.7 F (37.1 C) (Temporal)   Ht 5' 4 (1.626 m)   Wt 152 lb 6.4 oz (69.1 kg)   SpO2 96%   BMI 26.16 kg/m      Assessment & Plan:  VIDEL NOBREGA comes in today with chief complaint of Medical Management of Chronic Issues (No concerns.  fasting. Had bottom teeth pulled before thanksgiving,)   Diagnosis and orders addressed:  1. Annual physical exam (Primary) - Bayer DCA Hb A1c Waived - CMP14+EGFR - CBC with Differential/Platelet - Lipid panel - TSH - Vitamin B12 - Microalbumin / creatinine urine ratio  2. Centrilobular emphysema (HCC) Stable  3. Mid back pain - traMADol  (ULTRAM ) 50 MG tablet; Take 1 tablet (50 mg total) by mouth every 6 (six) hours as needed.  Dispense: 30 tablet; Refill: 2 - ToxASSURE Select 13 (MW), Urine - CMP14+EGFR - CBC with Differential/Platelet  4. Dementia with anxiety, unspecified dementia severity, unspecified dementia type (HCC) -Will increase Aricept  to 10 mg and Nameda to 10 mg BID from 5  mg Memory strategies  - donepezil  (ARICEPT ) 10 MG tablet; Take 1 tablet (10 mg total) by mouth at bedtime.  Dispense: 90 tablet; Refill: 2 - memantine  (NAMENDA ) 10 MG tablet; Take 1 tablet (10 mg total) by mouth 2 (two) times daily.  Dispense: 180 tablet; Refill: 2 - CMP14+EGFR - CBC with Differential/Platelet  5. Overweight (BMI 25.0-29.9) - CMP14+EGFR - CBC with Differential/Platelet  6. Osteopenia, unspecified location - CMP14+EGFR - CBC with Differential/Platelet  7. Hypertension associated with diabetes (HCC) - CMP14+EGFR - CBC with Differential/Platelet - amLODipine  (NORVASC ) 10 MG tablet; Take 1 tablet (10 mg total) by mouth daily.  Dispense: 90 tablet; Refill: 2  8. Hyperlipidemia associated with type 2 diabetes mellitus (HCC) - CMP14+EGFR - CBC with Differential/Platelet  9. Diabetes mellitus due to underlying condition with hyperosmolarity without coma, without long-term current use of insulin (HCC) - Bayer DCA Hb A1c Waived - CMP14+EGFR - CBC with Differential/Platelet - TSH - Vitamin B12 - Microalbumin / creatinine urine ratio  10. Diabetic polyneuropathy associated with type 2 diabetes mellitus (HCC) - CMP14+EGFR - CBC with Differential/Platelet  11. Vitamin D  deficiency - CMP14+EGFR - CBC with Differential/Platelet  12. Lumbosacral spondylosis without myelopathy - CMP14+EGFR - CBC with Differential/Platelet  13. Controlled substance agreement signed - traMADol  (ULTRAM ) 50 MG tablet; Take 1 tablet (50 mg total) by mouth every 6 (six) hours as needed.  Dispense: 30 tablet; Refill: 2 - ToxASSURE Select 13 (MW), Urine - CMP14+EGFR - CBC with Differential/Platelet  14. Metabolic syndrome - CMP14+EGFR - CBC with Differential/Platelet    Labs pending Will increase amlodipine  to 10 mg from 5 mg  Will increase Namenda  and Aricpet to 10  mg Patient reviewed in [place] controlled database, no flags noted. Contract and drug screen are up to date.  Health  Maintenance reviewed Diet and exercise encouraged  Follow up plan: 2 weeks to recheck HTN   Bari Learn, FNP

## 2024-04-28 ENCOUNTER — Ambulatory Visit: Payer: Self-pay | Admitting: Family

## 2024-04-28 LAB — CMP14+EGFR
ALT: 10 IU/L (ref 0–32)
AST: 18 IU/L (ref 0–40)
Albumin: 4.5 g/dL (ref 3.9–4.9)
Alkaline Phosphatase: 49 IU/L (ref 49–135)
BUN/Creatinine Ratio: 23 (ref 12–28)
BUN: 22 mg/dL (ref 8–27)
Bilirubin Total: 0.5 mg/dL (ref 0.0–1.2)
CO2: 25 mmol/L (ref 20–29)
Calcium: 9.8 mg/dL (ref 8.7–10.3)
Chloride: 103 mmol/L (ref 96–106)
Creatinine, Ser: 0.95 mg/dL (ref 0.57–1.00)
Globulin, Total: 2.5 g/dL (ref 1.5–4.5)
Glucose: 132 mg/dL — ABNORMAL HIGH (ref 70–99)
Potassium: 4.1 mmol/L (ref 3.5–5.2)
Sodium: 144 mmol/L (ref 134–144)
Total Protein: 7 g/dL (ref 6.0–8.5)
eGFR: 64 mL/min/1.73 (ref >59)

## 2024-04-28 LAB — CBC WITH DIFFERENTIAL/PLATELET
Basophils Absolute: 0.1 x10E3/uL (ref 0.0–0.2)
Basos: 1 %
EOS (ABSOLUTE): 0.1 x10E3/uL (ref 0.0–0.4)
Eos: 2 %
Hematocrit: 45.8 % (ref 34.0–46.6)
Hemoglobin: 15.1 g/dL (ref 11.1–15.9)
Immature Grans (Abs): 0 x10E3/uL (ref 0.0–0.1)
Immature Granulocytes: 0 %
Lymphocytes Absolute: 1.8 x10E3/uL (ref 0.7–3.1)
Lymphs: 22 %
MCH: 32.8 pg (ref 26.6–33.0)
MCHC: 33 g/dL (ref 31.5–35.7)
MCV: 99 fL — ABNORMAL HIGH (ref 79–97)
Monocytes Absolute: 0.7 x10E3/uL (ref 0.1–0.9)
Monocytes: 8 %
Neutrophils Absolute: 5.6 x10E3/uL (ref 1.4–7.0)
Neutrophils: 67 %
Platelets: 208 x10E3/uL (ref 150–450)
RBC: 4.61 x10E6/uL (ref 3.77–5.28)
RDW: 11.9 % (ref 11.7–15.4)
WBC: 8.2 x10E3/uL (ref 3.4–10.8)

## 2024-04-28 LAB — MICROALBUMIN / CREATININE URINE RATIO
Creatinine, Urine: 176.4 mg/dL
Microalb/Creat Ratio: 159 mg/g{creat} — ABNORMAL HIGH (ref 0–29)
Microalbumin, Urine: 280.1 ug/mL

## 2024-04-28 LAB — LIPID PANEL
Chol/HDL Ratio: 2.1 ratio (ref 0.0–4.4)
Cholesterol, Total: 144 mg/dL (ref 100–199)
HDL: 68 mg/dL (ref >39)
LDL Chol Calc (NIH): 60 mg/dL (ref 0–99)
Triglycerides: 88 mg/dL (ref 0–149)
VLDL Cholesterol Cal: 16 mg/dL (ref 5–40)

## 2024-04-28 LAB — TSH: TSH: 0.901 u[IU]/mL (ref 0.450–4.500)

## 2024-04-28 LAB — VITAMIN B12: Vitamin B-12: 497 pg/mL (ref 232–1245)

## 2024-04-29 LAB — TOXASSURE SELECT 13 (MW), URINE

## 2024-05-14 ENCOUNTER — Encounter: Payer: Self-pay | Admitting: Family

## 2024-05-14 ENCOUNTER — Ambulatory Visit: Admitting: Family

## 2024-05-14 VITALS — BP 148/65 | HR 54 | Temp 97.6°F | Ht 64.0 in | Wt 154.4 lb

## 2024-05-14 DIAGNOSIS — E1159 Type 2 diabetes mellitus with other circulatory complications: Secondary | ICD-10-CM | POA: Diagnosis not present

## 2024-05-14 DIAGNOSIS — I152 Hypertension secondary to endocrine disorders: Secondary | ICD-10-CM | POA: Diagnosis not present

## 2024-05-14 DIAGNOSIS — F0394 Unspecified dementia, unspecified severity, with anxiety: Secondary | ICD-10-CM

## 2024-05-14 LAB — BMP8+EGFR
BUN/Creatinine Ratio: 28 (ref 12–28)
BUN: 33 mg/dL — ABNORMAL HIGH (ref 8–27)
CO2: 25 mmol/L (ref 20–29)
Calcium: 9.9 mg/dL (ref 8.7–10.3)
Chloride: 99 mmol/L (ref 96–106)
Creatinine, Ser: 1.19 mg/dL — ABNORMAL HIGH (ref 0.57–1.00)
Glucose: 116 mg/dL — ABNORMAL HIGH (ref 70–99)
Potassium: 4 mmol/L (ref 3.5–5.2)
Sodium: 141 mmol/L (ref 134–144)
eGFR: 49 mL/min/1.73 — ABNORMAL LOW (ref >59)

## 2024-05-14 NOTE — Progress Notes (Signed)
 Subjective:    Patient ID: Holly Price, female    DOB: [birth year], 73 y.o.   MRN: [id number]  Chief Complaint  Patient presents with   Hypertension   Pt presents to the office today with to recheck HTN. She was seen on 04/27/24 and we increased her Norvasc  to 10 mg from 5 mg. Her BP is not at goal today.   We also increased Aricept  and Namenda  to 10 mg. Denies any nausea and tolerating well.  Hypertension This is a chronic problem. The current episode started more than 1 year ago. The problem has been waxing and waning since onset. The problem is uncontrolled. Associated symptoms include peripheral edema. Pertinent negatives include no malaise/fatigue or shortness of breath. Past treatments include calcium  channel blockers, beta blockers, ACE inhibitors and diuretics. The current treatment provides mild improvement.      Review of Systems  Constitutional:  Negative for malaise/fatigue.  Respiratory:  Negative for shortness of breath.   All other systems reviewed and are negative.   Social History   Socioeconomic History   Marital status: Married    Spouse name: Holly Price    Number of children: 1   Years of education: 11th   Highest education level: 11th grade  Occupational History   Occupation: Retired     Comment: CNA  Tobacco Use   Smoking status: Former    Current packs/day: 0.00    Average packs/day: 0.5 packs/day for 10.0 years (5.0 ttl pk-yrs)    Types: Cigarettes    Start date: 06/22/2002    Quit date: 06/22/2012    Years since quitting: 11.9   Smokeless tobacco: Never  Vaping Use   Vaping status: Never Used  Substance and Sexual Activity   Alcohol use: Not Currently   Drug use: Not Currently   Sexual activity: Not Currently  Other Topics Concern   Not on file  Social History Narrative   Lives with husband of over 40 years   Son lives on the same street.   Social Drivers of Health   Tobacco Use: Medium Risk (05/14/2024)   Patient History    Smoking  Tobacco Use: Former    Smokeless Tobacco Use: Never    Passive Exposure: Not on file  Financial Resource Strain: Low Risk (04/27/2024)   Overall Financial Resource Strain (CARDIA)    Difficulty of Paying Living Expenses: Not very hard  Food Insecurity: No Food Insecurity (04/27/2024)   Epic    Worried About Programme Researcher, Broadcasting/film/video in the Last Year: Never true    Ran Out of Food in the Last Year: Never true  Transportation Needs: No Transportation Needs (04/27/2024)   Epic    Lack of Transportation (Medical): No    Lack of Transportation (Non-Medical): No  Physical Activity: Insufficiently Active (04/27/2024)   Exercise Vital Sign    Days of Exercise per Week: 1 day    Minutes of Exercise per Session: 10 min  Stress: No Stress Concern Present (04/27/2024)   [employer] of Occupational Health - Occupational Stress Questionnaire    Feeling of Stress: Not at all  Social Connections: Moderately Isolated (04/27/2024)   Social Connection and Isolation Panel    Frequency of Communication with Friends and Family: Once a week    Frequency of Social Gatherings with Friends and Family: Once a week    Attends Religious Services: More than 4 times per year    Active Member of [employer] or Organizations: No    Attends [employer]  or Organization Meetings: Not on file    Marital Status: Married  Depression [phone]): Low Risk (04/27/2024)   Depression (PHQ2-9)    PHQ-2 Score: 0  Alcohol Screen: Low Risk (05/14/2023)   Alcohol Screen    Last Alcohol Screening Score (AUDIT): 0  Housing: Unknown (04/27/2024)   Epic    Unable to Pay for Housing in the Last Year: No    Number of Times Moved in the Last Year: Not on file    Homeless in the Last Year: No  Utilities: Not At Risk (05/14/2023)   [facility] Utilities    Threatened with loss of utilities: No  Health Literacy: Adequate Health Literacy (05/14/2023)   B1300 Health Literacy    Frequency of need for help with medical instructions: Never   Family History   Problem Relation Age of Onset   Cancer Mother    Heart attack Father    Hypertension Son    Heart attack Sister    Colon cancer Neg Hx         Objective:   Physical Exam Vitals reviewed.  Constitutional:      General: She is not in acute distress.    Appearance: She is well-developed.  HENT:     Head: Normocephalic and atraumatic.     Right Ear: Tympanic membrane normal.     Left Ear: Tympanic membrane normal.  Eyes:     Pupils: Pupils are equal, round, and reactive to light.  Neck:     Thyroid : No thyromegaly.  Cardiovascular:     Rate and Rhythm: Normal rate and regular rhythm.     Heart sounds: Normal heart sounds. No murmur heard. Pulmonary:     Effort: Pulmonary effort is normal. No respiratory distress.     Breath sounds: Normal breath sounds. No wheezing.  Abdominal:     General: Bowel sounds are normal. There is no distension.     Palpations: Abdomen is soft.     Tenderness: There is no abdominal tenderness.  Musculoskeletal:        General: No tenderness. Normal range of motion.     Cervical back: Normal range of motion and neck supple.  Skin:    General: Skin is warm and dry.  Neurological:     Mental Status: She is alert and oriented to person, place, and time.     Cranial Nerves: No cranial nerve deficit.     Deep Tendon Reflexes: Reflexes are normal and symmetric.  Psychiatric:        Behavior: Behavior normal.        Thought Content: Thought content normal.        Judgment: Judgment normal.       BP (!) 148/65   Pulse (!) 54   Temp 97.6 F (36.4 C) (Temporal)   Ht 5' 4 (1.626 m)   Wt 154 lb 6.4 oz (70 kg)   SpO2 96%   BMI 26.50 kg/m      Assessment & Plan:  Holly Price comes in today with chief complaint of Hypertension   Diagnosis and orders addressed:  1. Hypertension associated with diabetes (HCC) (Primary) Improved on recheck  -Dash diet information given -Exercise encouraged - Stress Management  -Continue current  meds - BMP8+EGFR  2. Dementia with anxiety, unspecified dementia severity, unspecified dementia type (HCC) Continue Namenda  and Aricept   Memory strategies  - BMP8+EGFR   Labs pending Continue current medications  Keep follow up with specialists  Health Maintenance reviewed Diet and exercise  encouraged  Return in about 3 months (around 08/12/2024), or if symptoms worsen or fail to improve.    Bari Learn, FNP

## 2024-05-14 NOTE — Patient Instructions (Signed)
 Hypertension, Adult High blood pressure (hypertension) is when the force of blood pumping through the arteries is too strong. The arteries are the blood vessels that carry blood from the heart throughout the body. Hypertension forces the heart to work harder to pump blood and may cause arteries to become narrow or stiff. Untreated or uncontrolled hypertension can lead to a heart attack, heart failure, a stroke, kidney disease, and other problems. A blood pressure reading consists of a higher number over a lower number. Ideally, your blood pressure should be below 120/80. The first ("top") number is called the systolic pressure. It is a measure of the pressure in your arteries as your heart beats. The second ("bottom") number is called the diastolic pressure. It is a measure of the pressure in your arteries as the heart relaxes. What are the causes? The exact cause of this condition is not known. There are some conditions that result in high blood pressure. What increases the risk? Certain factors may make you more likely to develop high blood pressure. Some of these risk factors are under your control, including: Smoking. Not getting enough exercise or physical activity. Being overweight. Having too much fat, sugar, calories, or salt (sodium) in your diet. Drinking too much alcohol. Other risk factors include: Having a personal history of heart disease, diabetes, high cholesterol, or kidney disease. Stress. Having a family history of high blood pressure and high cholesterol. Having obstructive sleep apnea. Age. The risk increases with age. What are the signs or symptoms? High blood pressure may not cause symptoms. Very high blood pressure (hypertensive crisis) may cause: Headache. Fast or irregular heartbeats (palpitations). Shortness of breath. Nosebleed. Nausea and vomiting. Vision changes. Severe chest pain, dizziness, and seizures. How is this diagnosed? This condition is diagnosed by  measuring your blood pressure while you are seated, with your arm resting on a flat surface, your legs uncrossed, and your feet flat on the floor. The cuff of the blood pressure monitor will be placed directly against the skin of your upper arm at the level of your heart. Blood pressure should be measured at least twice using the same arm. Certain conditions can cause a difference in blood pressure between your right and left arms. If you have a high blood pressure reading during one visit or you have normal blood pressure with other risk factors, you may be asked to: Return on a different day to have your blood pressure checked again. Monitor your blood pressure at home for 1 week or longer. If you are diagnosed with hypertension, you may have other blood or imaging tests to help your health care provider understand your overall risk for other conditions. How is this treated? This condition is treated by making healthy lifestyle changes, such as eating healthy foods, exercising more, and reducing your alcohol intake. You may be referred for counseling on a healthy diet and physical activity. Your health care provider may prescribe medicine if lifestyle changes are not enough to get your blood pressure under control and if: Your systolic blood pressure is above 130. Your diastolic blood pressure is above 80. Your personal target blood pressure may vary depending on your medical conditions, your age, and other factors. Follow these instructions at home: Eating and drinking  Eat a diet that is high in fiber and potassium, and low in sodium, added sugar, and fat. An example of this eating plan is called the DASH diet. DASH stands for Dietary Approaches to Stop Hypertension. To eat this way: Eat  plenty of fresh fruits and vegetables. Try to fill one half of your plate at each meal with fruits and vegetables. Eat whole grains, such as whole-wheat pasta, brown rice, or whole-grain bread. Fill about one  fourth of your plate with whole grains. Eat or drink low-fat dairy products, such as skim milk or low-fat yogurt. Avoid fatty cuts of meat, processed or cured meats, and poultry with skin. Fill about one fourth of your plate with lean proteins, such as fish, chicken without skin, beans, eggs, or tofu. Avoid pre-made and processed foods. These tend to be higher in sodium, added sugar, and fat. Reduce your daily sodium intake. Many people with hypertension should eat less than 1,500 mg of sodium a day. Do not drink alcohol if: Your health care provider tells you not to drink. You are pregnant, may be pregnant, or are planning to become pregnant. If you drink alcohol: Limit how much you have to: 0-1 drink a day for women. 0-2 drinks a day for men. Know how much alcohol is in your drink. In the [place] one drink equals one 12 oz bottle of beer (355 mL), one 5 oz glass of wine (148 mL), or one 1 oz glass of hard liquor (44 mL). Lifestyle  Work with your health care provider to maintain a healthy body weight or to lose weight. Ask what an ideal weight is for you. Get at least 30 minutes of exercise that causes your heart to beat faster (aerobic exercise) most days of the week. Activities may include walking, swimming, or biking. Include exercise to strengthen your muscles (resistance exercise), such as Pilates or lifting weights, as part of your weekly exercise routine. Try to do these types of exercises for 30 minutes at least 3 days a week. Do not use any products that contain nicotine or tobacco. These products include cigarettes, chewing tobacco, and vaping devices, such as e-cigarettes. If you need help quitting, ask your health care provider. Monitor your blood pressure at home as told by your health care provider. Keep all follow-up visits. This is important. Medicines Take over-the-counter and prescription medicines only as told by your health care provider. Follow directions carefully. Blood  pressure medicines must be taken as prescribed. Do not skip doses of blood pressure medicine. Doing this puts you at risk for problems and can make the medicine less effective. Ask your health care provider about side effects or reactions to medicines that you should watch for. Contact a health care provider if you: Think you are having a reaction to a medicine you are taking. Have headaches that keep coming back (recurring). Feel dizzy. Have swelling in your ankles. Have trouble with your vision. Get help right away if you: Develop a severe headache or confusion. Have unusual weakness or numbness. Feel faint. Have severe pain in your chest or abdomen. Vomit repeatedly. Have trouble breathing. These symptoms may be an emergency. Get help right away. Call 911. Do not wait to see if the symptoms will go away. Do not drive yourself to the hospital. Summary Hypertension is when the force of blood pumping through your arteries is too strong. If this condition is not controlled, it may put you at risk for serious complications. Your personal target blood pressure may vary depending on your medical conditions, your age, and other factors. For most people, a normal blood pressure is less than 120/80. Hypertension is treated with lifestyle changes, medicines, or a combination of both. Lifestyle changes include losing weight, eating a healthy,  low-sodium diet, exercising more, and limiting alcohol. This information is not intended to replace advice given to you by your health care provider. Make sure you discuss any questions you have with your health care provider. Document Revised: 03/21/2021 Document Reviewed: 03/21/2021 Elsevier Patient Education  2024 [employer].

## 2024-05-15 ENCOUNTER — Ambulatory Visit: Payer: Self-pay | Admitting: Family

## 2024-05-27 ENCOUNTER — Other Ambulatory Visit: Payer: Self-pay | Admitting: Family

## 2024-05-27 DIAGNOSIS — E1159 Type 2 diabetes mellitus with other circulatory complications: Secondary | ICD-10-CM

## 2024-06-10 ENCOUNTER — Other Ambulatory Visit: Payer: Self-pay | Admitting: Family

## 2024-06-10 DIAGNOSIS — E1159 Type 2 diabetes mellitus with other circulatory complications: Secondary | ICD-10-CM

## 2024-07-15 ENCOUNTER — Ambulatory Visit

## 2024-08-13 ENCOUNTER — Ambulatory Visit: Admitting: Family
